# Patient Record
Sex: Female | Born: 1950 | Smoking: Never smoker
Health system: Southern US, Community
[De-identification: ages and names within clinical notes are randomized; demographics above are authoritative.]

## PROBLEM LIST (undated history)

## (undated) DIAGNOSIS — K759 Inflammatory liver disease, unspecified: Secondary | ICD-10-CM

## (undated) DIAGNOSIS — I2510 Atherosclerotic heart disease of native coronary artery without angina pectoris: Secondary | ICD-10-CM

## (undated) DIAGNOSIS — R011 Cardiac murmur, unspecified: Secondary | ICD-10-CM

## (undated) DIAGNOSIS — M797 Fibromyalgia: Secondary | ICD-10-CM

## (undated) DIAGNOSIS — E7800 Pure hypercholesterolemia, unspecified: Secondary | ICD-10-CM

## (undated) DIAGNOSIS — M799 Soft tissue disorder, unspecified: Secondary | ICD-10-CM

## (undated) DIAGNOSIS — E559 Vitamin D deficiency, unspecified: Secondary | ICD-10-CM

## (undated) DIAGNOSIS — G4700 Insomnia, unspecified: Secondary | ICD-10-CM

## (undated) DIAGNOSIS — D649 Anemia, unspecified: Secondary | ICD-10-CM

## (undated) DIAGNOSIS — K449 Diaphragmatic hernia without obstruction or gangrene: Secondary | ICD-10-CM

## (undated) DIAGNOSIS — I10 Essential (primary) hypertension: Secondary | ICD-10-CM

## (undated) DIAGNOSIS — F419 Anxiety disorder, unspecified: Secondary | ICD-10-CM

## (undated) DIAGNOSIS — K529 Noninfective gastroenteritis and colitis, unspecified: Secondary | ICD-10-CM

## (undated) DIAGNOSIS — G4730 Sleep apnea, unspecified: Secondary | ICD-10-CM

## (undated) DIAGNOSIS — R0602 Shortness of breath: Secondary | ICD-10-CM

## (undated) DIAGNOSIS — F329 Major depressive disorder, single episode, unspecified: Secondary | ICD-10-CM

## (undated) DIAGNOSIS — K219 Gastro-esophageal reflux disease without esophagitis: Secondary | ICD-10-CM

## (undated) DIAGNOSIS — F32A Depression, unspecified: Secondary | ICD-10-CM

## (undated) DIAGNOSIS — E669 Obesity, unspecified: Secondary | ICD-10-CM

## (undated) DIAGNOSIS — R51 Headache: Secondary | ICD-10-CM

## (undated) DIAGNOSIS — M1990 Unspecified osteoarthritis, unspecified site: Secondary | ICD-10-CM

## (undated) DIAGNOSIS — K2950 Unspecified chronic gastritis without bleeding: Secondary | ICD-10-CM

## (undated) DIAGNOSIS — T4145XA Adverse effect of unspecified anesthetic, initial encounter: Secondary | ICD-10-CM

## (undated) DIAGNOSIS — T8859XA Other complications of anesthesia, initial encounter: Secondary | ICD-10-CM

## (undated) DIAGNOSIS — A159 Respiratory tuberculosis unspecified: Secondary | ICD-10-CM

## (undated) DIAGNOSIS — M818 Other osteoporosis without current pathological fracture: Secondary | ICD-10-CM

## (undated) DIAGNOSIS — E119 Type 2 diabetes mellitus without complications: Secondary | ICD-10-CM

## (undated) DIAGNOSIS — J45909 Unspecified asthma, uncomplicated: Secondary | ICD-10-CM

## (undated) HISTORY — DX: Unspecified asthma, uncomplicated: J45.909

## (undated) HISTORY — DX: Other osteoporosis without current pathological fracture: M81.8

## (undated) HISTORY — DX: Type 2 diabetes mellitus without complications: E11.9

## (undated) HISTORY — DX: Vitamin D deficiency, unspecified: E55.9

## (undated) HISTORY — DX: Essential (primary) hypertension: I10

## (undated) HISTORY — DX: Atherosclerotic heart disease of native coronary artery without angina pectoris: I25.10

## (undated) HISTORY — DX: Soft tissue disorder, unspecified: M79.9

## (undated) HISTORY — DX: Diaphragmatic hernia without obstruction or gangrene: K44.9

## (undated) HISTORY — DX: Noninfective gastroenteritis and colitis, unspecified: K52.9

## (undated) HISTORY — DX: Pure hypercholesterolemia, unspecified: E78.00

## (undated) HISTORY — PX: BREAST BIOPSY: SHX20

## (undated) HISTORY — PX: CHOLECYSTECTOMY: SHX55

## (undated) HISTORY — DX: Unspecified chronic gastritis without bleeding: K29.50

## (undated) HISTORY — PX: REDUCTION MAMMAPLASTY: SUR839

## (undated) HISTORY — PX: APPENDECTOMY: SHX54

## (undated) HISTORY — DX: Insomnia, unspecified: G47.00

## (undated) HISTORY — PX: COSMETIC SURGERY: SHX468

## (undated) HISTORY — PX: DILATION AND CURETTAGE OF UTERUS: SHX78

## (undated) HISTORY — PX: COLONOSCOPY: SHX174

## (undated) HISTORY — DX: Obesity, unspecified: E66.9

---

## 2001-08-05 ENCOUNTER — Encounter: Payer: Self-pay | Admitting: Internal Medicine

## 2001-08-05 ENCOUNTER — Encounter: Admission: RE | Admit: 2001-08-05 | Discharge: 2001-08-05 | Payer: Self-pay | Admitting: Internal Medicine

## 2001-09-28 ENCOUNTER — Ambulatory Visit (HOSPITAL_COMMUNITY): Admission: RE | Admit: 2001-09-28 | Discharge: 2001-09-28 | Payer: Self-pay | Admitting: Internal Medicine

## 2001-09-30 ENCOUNTER — Encounter (HOSPITAL_BASED_OUTPATIENT_CLINIC_OR_DEPARTMENT_OTHER): Payer: Self-pay | Admitting: General Surgery

## 2001-10-02 ENCOUNTER — Encounter (INDEPENDENT_AMBULATORY_CARE_PROVIDER_SITE_OTHER): Payer: Self-pay | Admitting: *Deleted

## 2001-10-02 ENCOUNTER — Ambulatory Visit (HOSPITAL_COMMUNITY): Admission: RE | Admit: 2001-10-02 | Discharge: 2001-10-03 | Payer: Self-pay | Admitting: General Surgery

## 2001-10-02 ENCOUNTER — Encounter (HOSPITAL_BASED_OUTPATIENT_CLINIC_OR_DEPARTMENT_OTHER): Payer: Self-pay | Admitting: General Surgery

## 2003-01-18 ENCOUNTER — Encounter: Admission: RE | Admit: 2003-01-18 | Discharge: 2003-04-18 | Payer: Self-pay | Admitting: Internal Medicine

## 2003-03-19 ENCOUNTER — Emergency Department (HOSPITAL_COMMUNITY): Admission: EM | Admit: 2003-03-19 | Discharge: 2003-03-19 | Payer: Self-pay | Admitting: Emergency Medicine

## 2003-04-19 ENCOUNTER — Encounter: Admission: RE | Admit: 2003-04-19 | Discharge: 2003-07-18 | Payer: Self-pay | Admitting: Internal Medicine

## 2003-09-30 ENCOUNTER — Inpatient Hospital Stay (HOSPITAL_COMMUNITY): Admission: EM | Admit: 2003-09-30 | Discharge: 2003-10-01 | Payer: Self-pay | Admitting: Emergency Medicine

## 2004-05-19 ENCOUNTER — Emergency Department (HOSPITAL_COMMUNITY): Admission: EM | Admit: 2004-05-19 | Discharge: 2004-05-19 | Payer: Self-pay | Admitting: Family Medicine

## 2007-03-25 ENCOUNTER — Emergency Department (HOSPITAL_COMMUNITY): Admission: EM | Admit: 2007-03-25 | Discharge: 2007-03-25 | Payer: Self-pay | Admitting: Emergency Medicine

## 2007-04-14 ENCOUNTER — Emergency Department (HOSPITAL_COMMUNITY): Admission: EM | Admit: 2007-04-14 | Discharge: 2007-04-14 | Payer: Self-pay | Admitting: *Deleted

## 2007-04-20 ENCOUNTER — Emergency Department (HOSPITAL_COMMUNITY): Admission: EM | Admit: 2007-04-20 | Discharge: 2007-04-20 | Payer: Self-pay | Admitting: Emergency Medicine

## 2007-07-21 ENCOUNTER — Encounter: Admission: RE | Admit: 2007-07-21 | Discharge: 2007-07-21 | Payer: Self-pay | Admitting: Internal Medicine

## 2007-07-24 ENCOUNTER — Encounter: Admission: RE | Admit: 2007-07-24 | Discharge: 2007-07-24 | Payer: Self-pay | Admitting: Internal Medicine

## 2007-07-27 ENCOUNTER — Encounter: Admission: RE | Admit: 2007-07-27 | Discharge: 2007-07-27 | Payer: Self-pay | Admitting: Internal Medicine

## 2007-07-27 ENCOUNTER — Encounter (INDEPENDENT_AMBULATORY_CARE_PROVIDER_SITE_OTHER): Payer: Self-pay | Admitting: Diagnostic Radiology

## 2008-01-26 ENCOUNTER — Encounter: Admission: RE | Admit: 2008-01-26 | Discharge: 2008-01-26 | Payer: Self-pay | Admitting: Internal Medicine

## 2008-11-26 ENCOUNTER — Encounter: Admission: RE | Admit: 2008-11-26 | Discharge: 2008-11-26 | Payer: Self-pay | Admitting: Neurology

## 2008-12-20 ENCOUNTER — Encounter: Admission: RE | Admit: 2008-12-20 | Discharge: 2009-01-23 | Payer: Self-pay | Admitting: Neurology

## 2008-12-20 ENCOUNTER — Ambulatory Visit: Payer: Self-pay | Admitting: Psychology

## 2009-02-28 ENCOUNTER — Encounter: Admission: RE | Admit: 2009-02-28 | Discharge: 2009-02-28 | Payer: Self-pay | Admitting: Internal Medicine

## 2010-04-04 ENCOUNTER — Encounter: Admission: RE | Admit: 2010-04-04 | Discharge: 2010-04-04 | Payer: Self-pay | Admitting: Internal Medicine

## 2010-04-30 ENCOUNTER — Ambulatory Visit (HOSPITAL_COMMUNITY): Admission: EM | Admit: 2010-04-30 | Discharge: 2010-05-01 | Payer: Self-pay | Admitting: Emergency Medicine

## 2010-04-30 ENCOUNTER — Encounter (INDEPENDENT_AMBULATORY_CARE_PROVIDER_SITE_OTHER): Payer: Self-pay | Admitting: General Surgery

## 2010-09-07 DIAGNOSIS — R519 Headache, unspecified: Secondary | ICD-10-CM | POA: Insufficient documentation

## 2010-09-07 DIAGNOSIS — M799 Soft tissue disorder, unspecified: Secondary | ICD-10-CM

## 2010-09-07 HISTORY — DX: Soft tissue disorder, unspecified: M79.9

## 2010-09-24 DIAGNOSIS — E1149 Type 2 diabetes mellitus with other diabetic neurological complication: Secondary | ICD-10-CM | POA: Insufficient documentation

## 2010-09-24 DIAGNOSIS — E118 Type 2 diabetes mellitus with unspecified complications: Secondary | ICD-10-CM | POA: Insufficient documentation

## 2010-09-24 DIAGNOSIS — E1165 Type 2 diabetes mellitus with hyperglycemia: Secondary | ICD-10-CM | POA: Insufficient documentation

## 2010-09-24 DIAGNOSIS — I152 Hypertension secondary to endocrine disorders: Secondary | ICD-10-CM | POA: Insufficient documentation

## 2010-12-01 ENCOUNTER — Encounter (HOSPITAL_BASED_OUTPATIENT_CLINIC_OR_DEPARTMENT_OTHER): Payer: Self-pay | Admitting: General Surgery

## 2010-12-20 DIAGNOSIS — M3320 Polymyositis, organ involvement unspecified: Secondary | ICD-10-CM | POA: Insufficient documentation

## 2010-12-20 DIAGNOSIS — J45901 Unspecified asthma with (acute) exacerbation: Secondary | ICD-10-CM

## 2010-12-20 HISTORY — DX: Unspecified asthma with (acute) exacerbation: J45.901

## 2011-01-27 LAB — POCT CARDIAC MARKERS
CKMB, poc: 1 ng/mL (ref 1.0–8.0)
Myoglobin, poc: 92.5 ng/mL (ref 12–200)
Troponin i, poc: <0.05 ng/mL (ref 0.00–0.09)

## 2011-01-27 LAB — HEPATIC FUNCTION PANEL
Bilirubin, Direct: 0.1 mg/dL (ref 0.0–0.3)
Indirect Bilirubin: 0.2 mg/dL — ABNORMAL LOW (ref 0.3–0.9)
Total Protein: 7.6 g/dL (ref 6.0–8.3)

## 2011-01-27 LAB — URINALYSIS, ROUTINE W REFLEX MICROSCOPIC
Hgb urine dipstick: NEGATIVE
Nitrite: NEGATIVE
Protein, ur: NEGATIVE mg/dL
Specific Gravity, Urine: 1.009 (ref 1.005–1.030)
pH: 7.5 (ref 5.0–8.0)

## 2011-01-27 LAB — DIFFERENTIAL
Basophils Absolute: 0 10*3/uL (ref 0.0–0.1)
Eosinophils Relative: 0 % (ref 0–5)
Lymphocytes Relative: 14 % (ref 12–46)
Lymphs Abs: 1.3 10*3/uL (ref 0.7–4.0)
Monocytes Absolute: 0.2 10*3/uL (ref 0.1–1.0)
Neutro Abs: 7.8 10*3/uL — ABNORMAL HIGH (ref 1.7–7.7)
Neutrophils Relative %: 84 % — ABNORMAL HIGH (ref 43–77)

## 2011-01-27 LAB — CBC
Hemoglobin: 12.9 g/dL (ref 12.0–15.0)
RBC: 4.98 MIL/uL (ref 3.87–5.11)
RDW: 14.1 % (ref 11.5–15.5)
WBC: 9.4 10*3/uL (ref 4.0–10.5)

## 2011-01-27 LAB — GLUCOSE, CAPILLARY: Glucose-Capillary: 111 mg/dL — ABNORMAL HIGH (ref 70–99)

## 2011-01-27 LAB — BASIC METABOLIC PANEL
GFR calc Af Amer: >60 mL/min (ref >60)
GFR calc non Af Amer: >60 mL/min (ref >60)
Glucose, Bld: 124 mg/dL — ABNORMAL HIGH (ref 70–99)
Potassium: 3.9 mEq/L (ref 3.5–5.1)

## 2011-01-27 LAB — LIPASE, BLOOD: Lipase: 47 U/L (ref 11–59)

## 2011-03-29 NOTE — Op Note (Signed)
[place]  Patient:    Pam [name], Pam [name] Visit Number: [id number] MRN: [id number]          Service Type: DSU Location: [phone] Attending Physician:  Sonda Primes Dictated by:   Mardene Celeste. Lurene Shadow, M.D. Proc. Date: 10/02/01 Admit Date:  10/02/2001                             Operative Report  PREOPERATIVE DIAGNOSIS:  Chronic calculous cholecystitis.  POSTOPERATIVE DIAGNOSIS:  Chronic calculous cholecystitis.  PROCEDURE:  Laparoscopic cholecystectomy and intraoperative cholangiogram.  SURGEON:  Luisa Hart L. Lurene Shadow, M.D.  ASSISTANT:  Marnee Spring. Wiliam Ke, M.D.  ANESTHESIA:  General.  CLINICAL NOTE:  The patient is a 60 year old woman with symptoms of right upper quadrant pain, nausea, and bloating, on evaluation noted to have cholelithiasis.  Liver function studies, amylase, and lipase are within normal limits except for a mildly elevated SGPT at 52.  She has been brought now to the operating room for laparoscopic cholecystectomy and cholangiogram.  DESCRIPTION OF PROCEDURE:  Following the induction of satisfactory anesthesia with the patient positioned supinely, the abdomen is routinely prepped and draped to be included in the sterile operative field.  Open laparoscopy is carried out at the umbilicus, and the abdomen is insufflated with carbon dioxide to 14 Tor.  The camera is inserted and visual exploration carried out. The liver edge is sharp with the surfaces smooth.  The gallbladder with some modest scarring.  Anterior gastric wall and duodenal sweep within normal limits.  No small or large intestine viewed appeared to be abnormal.  No pelvic organs were visualized.  Under direct vision, epigastric and lateral ports were placed.  The gallbladder is grasped and retracted cephalad and adhesions of the gallbladder are taken down, carrying the dissection down to the ampulla.  At this point the cystic artery and cystic duct were  identified, cystic duct being traced up to the cystic duct-gallbladder junction and the cystic artery traced to its entry into the gallbladder wall.  The cystic artery doubly clipped and transected, cystic duct clipped proximally and opened.  I inserted a Reddick catheter into the abdomen and carried out a cholangiogram by injecting one-half strength Hypaque into the biliary system. The resulting cholangiogram showed no filling defect within the biliary system, free of contrast into the duodenum.  The cholangiocatheter is removed and the cystic duct doubly clipped and transected, the gallbladder dissected away from the liver bed using electrocautery.  The right upper quadrant was thoroughly irrigated with saline and aspirated.  The camera moved to the epigastric port and the gallbladder retrieved through the umbilical port without difficulty.  Sponge, instrument, and sharp counts verified. Pneumoperitoneum deflated, and the wounds closed in layers as follows after trocars were removed under direct vision:  The umbilical wound in two layers with 0 Dexon and 4-0 Dexon, epigastric and lateral flank wounds closed with 4-0 Dexon sutures.  All the wounds then reinforced with Steri-Strips and sterile dressings applied.  Anesthetic reversed, patient removed from the operating room to the recovery room in stable condition.  She tolerated the procedure well. Dictated by:   Mardene Celeste. Lurene Shadow, M.D. Attending Physician:  Sonda Primes DD:  10/02/01 TD:  10/02/01 Job: [id number]

## 2011-03-29 NOTE — Discharge Summary (Signed)
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  She  presented to [facility] Emergency Room on September 30, 2003, with a two day  history of recurrent episodes of chest pain.  These are brief in duration,  associated with a squeezing sensation along with some tightness in her left  arm area.  Noteworthy is that the patient has had two negative stress tests,  most recently in June of 2004 when a Cardiolite stress done at [facility] was reportedly normal.   PHYSICAL EXAM AT THE TIME OF ADMISSION:  HEENT EXAM:  Within normal limits.  CHEST:  Clear.  BACK:  Revealed no CVA or point tenderness.  CARDIOVASCULAR EXAM:  Normal S1 and S2 without rubs, murmurs or gallops.  ABDOMEN:  Benign.  NEUROLOGIC TESTING:  Examination of extremities was normal.   Serial cardiac enzymes were obtained, these were negative.  The  electrocardiogram showed only nonspecific ST and T-wave changes.  A chest x-  ray was normal.  In light of the relatively acute nature of one of the  episodes on the day prior to admission, I sent her for a spiral CT scan of  the chest.  This showed no pulmonary pathology and was negative for  pulmonary embolus.   Professor Company secretary and I discussed her status in more detail the next day.  Two things stand out about this discussion.  One is that she seems to be  experiencing difficulty sleeping and describes other symptoms which suggest  to me that she may have some vegetative symptoms of depression.  Secondly,  she describes a history which  sounds really typical for sleep apnea.  She  has very loud snoring and other people have reported that she will sometimes  hold her breath at night when she sleeps.  On October 01, 2003, the patient  seemed to be doing very well, there was no residual chest discomfort.  Given  her description of the chest discomfort and the recent negative Cardiolite  study, I assessed her risk of having a cardiac problem as being low . We  discussed possibly having [facility] see her again, but it was  agreed that it was unlikely that any action would be taken on Saturday in  regard to any additional testing.  Therefore, on October 01, 2003 the  patient was discharged.   DISCHARGE DIAGNOSES:  1. Chest pain of uncertain etiology.  2. Diabetes.  3. Family history of heart disease.  4. Fatigue, possible depression.  5. History compatible with sleep apnea.   At the time of discharge I advised her to continue Avandia and Amaryl as  previously.  I also advised her to take one aspirin daily.  I suggested a  trial of Zoloft beginning with 25 mg daily.  Finally, I suggested that it  would be sensible  to discuss with Dr. Allyne Gee the possibility of scheduling a sleep study to  evaluate the possibility of sleep apnea.  This could certainly be  contributing to her fatigue symptoms.  I also encouraged her to follow up on  a regular basis with Dr. Allyne Gee and to contact her office on Monday to make  an appointment.                                                Sherin Quarry, MD    SY/MEDQ  D:  10/01/2003  T:  10/02/2003  Job:  [id number]   cc:   Candyce Churn. Allyne Gee, M.D.  [address]  [address]  [place]  [place] [address]  Fax: [phone]   [facility]

## 2011-03-29 NOTE — H&P (Signed)
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  Yesterday, the patient was seen at  Dr. Candyce Churn. Sanders's office and on completing her examination, she was  standing in line to pay her bill when she suddenly experienced a severe  substernal chest discomfort which was apparently quite striking but very  brief in duration.  She describes the pain as squeezing.  Dr. Allyne Gee  reevaluated her, did a cardiogram and told the patient that it was abnormal.  Initially, the plan was going to be to admit her to the hospital but after  she remained in the office for a while and rested, the EKG was repeated and  was said to be normal at that time.  The patient was released to home.  During the course of the evening, she felt somewhat nauseated but was able  to rest all right.  She said her chest had a full feeling.  Today, after  getting up, she developed intense chest tightness with pain radiating down  her left arm.  She felt dizzy, nauseated and slightly short of breath.  She  drove herself to the emergency room.  On arrival, she was given one  sublingual nitroglycerin, which she said seemed to partially relieve the  discomfort.  Now she is complaining of an aching feeling in which she  describes as being all over her chest.  She states that she is not dyspneic,  she is not having any diaphoresis or nausea.  All cardiac enzymes are  negative to date.  A STAT-8 is normal.   Of note is that in 2001, the patient was in a play at [facility]  when she developed chest pain and tightness and was evaluated in the  emergency room in [place], [place].  EKG and enzymes were normal at  that time.  A stress test was done which was negative.  In June of 2004, at  Dr. Zella Ball request, the patient had a Cardiolite stress done at [facility] which was reportedly normal as well.  The patient reports that  recently she has been feeling quite fatigued and has not been sleeping well  at night.   PAST MEDICAL HISTORY:   MEDICATIONS:  She takes:  1. Amaryl 2 mg daily.  2. Avandia 4 mg daily.   ILLNESSES:  The patient has a 16-month history of diabetes  which is said to  be very well regulated with glucoses generally in the range of 90 to 140.   FAMILY HISTORY:  The patient's grandmother has a history of diabetes.  Her  father died as a result of heart disease in his early 10s.  Her mother has a  history of heart problems.   SOCIAL HISTORY:  She does not smoke or use alcohol.  All of her family is  currently in [place].  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  She is  unmarried and apparently lives by herself.  She reports a feeling of  increased stress recently associated with insomnia.   REVIEW OF SYSTEMS:  HEAD:  She denies headache or dizziness.  EYES:  She  denies visual blurring or diplopia.  EARS, NOSE AND THROAT:  Denies earache,  sinus pain or sore throat.  CHEST:  Denies coughing, wheezing or chest  congestion.  CARDIOVASCULAR:  Denies orthopnea, PND or ankle edema.  GI:  Denies nausea, vomiting, abdominal pain, change in bowel habits, melena or  hematochezia.  GU:  Denies dysuria or urinary frequency, hesitancy or  nocturia.  RHEUMATOLOGIC:  Denies back pain or joint pain.  HEMATOLOGIC:  Denies easy bleeding or bruising.  NEUROLOGIC:  Denies history of seizure or  stroke.   PHYSICAL EXAMINATION:  VITAL SIGNS:  On physical exam, the blood pressure is  reported to be 130/80, pulse is 72,  respirations are 16 and regular.  HEENT:  Exam is within normal limits.  CHEST:  The chest is clear.  CARDIOVASCULAR:  Exam reveals normal S1 and S2 without murmurs, rubs, or  gallops.  ABDOMEN:  The abdomen is benign, minimal bowel sounds, without masses,  tenderness or organomegaly.  NEUROLOGIC/EXTREMITIES:  Neurologic testing and examination of extremities  are normal.   LABORATORY STUDIES:  Troponin levels are negative x3.  Sodium is 141,  potassium 4.0, chloride 106, bicarbonate 31.  Hemoglobin is 14.   The chest x-ray was within normal limits.   IMPRESSION:  1. History of recurrent chest pain of uncertain etiology.  2. Diabetes.  3. Family history of heart disease.  4. Fatigue possibly associated with depression and insomnia.   PLAN:  I think it might be prudent, in light of the sudden onset of the pain  the patient had yesterday, to obtain a spiral CT scan, just to make sure  there is no sign of a pulmonary embolus.  We will admit her to rule out MI  and I will order appropriate pain medication and, of course, we will monitor  her blood sugars.  It is rather hard to say what the best thing is to do  next.  She certainly has had thorough evaluation from the standpoint of  stress tests.  It might at least be reasonable to have the cardiologists see  her and see what their thoughts might be.                                                Sherin Quarry, MD    SY/MEDQ  D:  09/30/2003  T:  10/01/2003  Job:  [id number]   cc:   Candyce Churn. Allyne Gee, M.D.  [address]  [address]  [place]  [place] [address]  Fax: [phone]   [facility]

## 2012-02-03 ENCOUNTER — Other Ambulatory Visit (HOSPITAL_BASED_OUTPATIENT_CLINIC_OR_DEPARTMENT_OTHER): Payer: Self-pay | Admitting: Rheumatology

## 2012-02-03 DIAGNOSIS — R52 Pain, unspecified: Secondary | ICD-10-CM

## 2012-02-04 ENCOUNTER — Other Ambulatory Visit (HOSPITAL_BASED_OUTPATIENT_CLINIC_OR_DEPARTMENT_OTHER): Payer: Self-pay

## 2012-02-08 ENCOUNTER — Ambulatory Visit (HOSPITAL_BASED_OUTPATIENT_CLINIC_OR_DEPARTMENT_OTHER)
Admission: RE | Admit: 2012-02-08 | Discharge: 2012-02-08 | Disposition: A | Discharge status: Home / Self Care | Payer: BC Managed Care – PPO | Source: Ambulatory Visit | Attending: Rheumatology | Admitting: Rheumatology

## 2012-02-08 DIAGNOSIS — M25473 Effusion, unspecified ankle: Secondary | ICD-10-CM

## 2012-02-08 DIAGNOSIS — M25579 Pain in unspecified ankle and joints of unspecified foot: Secondary | ICD-10-CM | POA: Insufficient documentation

## 2012-02-08 DIAGNOSIS — M659 Unspecified synovitis and tenosynovitis, unspecified site: Secondary | ICD-10-CM | POA: Insufficient documentation

## 2012-02-08 DIAGNOSIS — M25476 Effusion, unspecified foot: Secondary | ICD-10-CM | POA: Insufficient documentation

## 2012-02-08 DIAGNOSIS — R52 Pain, unspecified: Secondary | ICD-10-CM

## 2012-02-24 ENCOUNTER — Emergency Department (HOSPITAL_COMMUNITY): Payer: BC Managed Care – PPO

## 2012-02-24 ENCOUNTER — Encounter (HOSPITAL_COMMUNITY): Payer: Self-pay | Admitting: *Deleted

## 2012-02-24 ENCOUNTER — Observation Stay (HOSPITAL_COMMUNITY)
Admission: EM | Admit: 2012-02-24 | Discharge: 2012-02-25 | Disposition: A | Discharge status: Home / Self Care | Payer: BC Managed Care – PPO | Source: Ambulatory Visit | Attending: Internal Medicine | Admitting: Internal Medicine

## 2012-02-24 DIAGNOSIS — J45909 Unspecified asthma, uncomplicated: Secondary | ICD-10-CM | POA: Insufficient documentation

## 2012-02-24 DIAGNOSIS — J4541 Moderate persistent asthma with (acute) exacerbation: Secondary | ICD-10-CM

## 2012-02-24 DIAGNOSIS — R209 Unspecified disturbances of skin sensation: Secondary | ICD-10-CM | POA: Insufficient documentation

## 2012-02-24 DIAGNOSIS — R55 Syncope and collapse: Secondary | ICD-10-CM | POA: Insufficient documentation

## 2012-02-24 DIAGNOSIS — M797 Fibromyalgia: Secondary | ICD-10-CM | POA: Insufficient documentation

## 2012-02-24 DIAGNOSIS — R61 Generalized hyperhidrosis: Secondary | ICD-10-CM | POA: Insufficient documentation

## 2012-02-24 DIAGNOSIS — R0789 Other chest pain: Principal | ICD-10-CM | POA: Diagnosis present

## 2012-02-24 DIAGNOSIS — E119 Type 2 diabetes mellitus without complications: Secondary | ICD-10-CM | POA: Diagnosis present

## 2012-02-24 DIAGNOSIS — R0602 Shortness of breath: Secondary | ICD-10-CM | POA: Insufficient documentation

## 2012-02-24 HISTORY — DX: Unspecified osteoarthritis, unspecified site: M19.90

## 2012-02-24 HISTORY — DX: Fibromyalgia: M79.7

## 2012-02-24 HISTORY — DX: Adverse effect of unspecified anesthetic, initial encounter: T41.45XA

## 2012-02-24 HISTORY — DX: Depression, unspecified: F32.A

## 2012-02-24 HISTORY — DX: Major depressive disorder, single episode, unspecified: F32.9

## 2012-02-24 HISTORY — DX: Moderate persistent asthma with (acute) exacerbation: J45.41

## 2012-02-24 HISTORY — DX: Other complications of anesthesia, initial encounter: T88.59XA

## 2012-02-24 HISTORY — DX: Shortness of breath: R06.02

## 2012-02-24 HISTORY — DX: Anxiety disorder, unspecified: F41.9

## 2012-02-24 HISTORY — DX: Cardiac murmur, unspecified: R01.1

## 2012-02-24 LAB — BASIC METABOLIC PANEL
CO2: 27 mEq/L (ref 19–32)
Calcium: 9.6 mg/dL (ref 8.4–10.5)
Chloride: 104 mEq/L (ref 96–112)
Glucose, Bld: 88 mg/dL (ref 70–99)
Sodium: 140 mEq/L (ref 135–145)

## 2012-02-24 LAB — DIFFERENTIAL
Eosinophils Relative: 2 % (ref 0–5)
Lymphocytes Relative: 37 % (ref 12–46)
Lymphs Abs: 2.1 10*3/uL (ref 0.7–4.0)
Monocytes Absolute: 0.4 10*3/uL (ref 0.1–1.0)

## 2012-02-24 LAB — CBC
HCT: 36.8 % (ref 36.0–46.0)
MCH: 25.7 pg — ABNORMAL LOW (ref 26.0–34.0)
MCHC: 34.5 g/dL (ref 30.0–36.0)
MCV: 75.4 fL — ABNORMAL LOW (ref 78.0–100.0)
Platelets: 315 10*3/uL (ref 150–400)
Platelets: 288 10*3/uL (ref 150–400)
RBC: 4.88 MIL/uL (ref 3.87–5.11)
RBC: 4.71 MIL/uL (ref 3.87–5.11)
RDW: 13.8 % (ref 11.5–15.5)
WBC: 5.6 10*3/uL (ref 4.0–10.5)

## 2012-02-24 LAB — CREATININE, SERUM: Creatinine, Ser: 0.64 mg/dL (ref 0.50–1.10)

## 2012-02-24 LAB — CARDIAC PANEL(CRET KIN+CKTOT+MB+TROPI)
CK, MB: 1.6 ng/mL (ref 0.3–4.0)
Total CK: 57 U/L (ref 7–177)
Troponin I: <0.3 ng/mL (ref <0.30)

## 2012-02-24 LAB — D-DIMER, QUANTITATIVE: D-Dimer, Quant: 0.34 ug/mL-FEU (ref 0.00–0.48)

## 2012-02-24 MED ORDER — LINAGLIPTIN 5 MG PO TABS
5.0000 mg | ORAL_TABLET | Freq: Two times a day (BID) | ORAL | Status: DC
  Administered 2012-02-25: 5 mg via ORAL
  Filled 2012-02-24 (×3): qty 1

## 2012-02-24 MED ORDER — HEPARIN SODIUM (PORCINE) 5000 UNIT/ML IJ SOLN
5000.0000 [IU] | Freq: Three times a day (TID) | INTRAMUSCULAR | Status: DC
  Administered 2012-02-24 – 2012-02-25 (×2): 5000 [IU] via SUBCUTANEOUS
  Filled 2012-02-24 (×5): qty 1

## 2012-02-24 MED ORDER — SAXAGLIPTIN-METFORMIN ER 2.5-1000 MG PO TB24: 1.0000 | ORAL_TABLET | Freq: Two times a day (BID) | ORAL | Status: DC

## 2012-02-24 MED ORDER — ONDANSETRON HCL 4 MG PO TABS: 4.0000 mg | ORAL_TABLET | Freq: Four times a day (QID) | ORAL | Status: DC | PRN

## 2012-02-24 MED ORDER — METFORMIN HCL 500 MG PO TABS
1000.0000 mg | ORAL_TABLET | Freq: Two times a day (BID) | ORAL | Status: DC
  Administered 2012-02-25: 1000 mg via ORAL
  Filled 2012-02-24 (×3): qty 2

## 2012-02-24 MED ORDER — ACETAMINOPHEN 650 MG RE SUPP: 650.0000 mg | Freq: Four times a day (QID) | RECTAL | Status: DC | PRN

## 2012-02-24 MED ORDER — POLYETHYLENE GLYCOL 3350 17 G PO PACK
17.0000 g | PACK | Freq: Every day | ORAL | Status: DC | PRN
  Filled 2012-02-24: qty 1

## 2012-02-24 MED ORDER — SODIUM CHLORIDE 0.9 % IJ SOLN: 3.0000 mL | INTRAMUSCULAR | Status: DC | PRN

## 2012-02-24 MED ORDER — ACETAMINOPHEN 325 MG PO TABS: 650.0000 mg | ORAL_TABLET | Freq: Four times a day (QID) | ORAL | Status: DC | PRN

## 2012-02-24 MED ORDER — INSULIN ASPART 100 UNIT/ML ~~LOC~~ SOLN: 0.0000 [IU] | Freq: Three times a day (TID) | SUBCUTANEOUS | Status: DC

## 2012-02-24 MED ORDER — SODIUM CHLORIDE 0.9 % IJ SOLN
3.0000 mL | Freq: Two times a day (BID) | INTRAMUSCULAR | Status: DC
  Administered 2012-02-24 – 2012-02-25 (×2): 3 mL via INTRAVENOUS

## 2012-02-24 MED ORDER — CYCLOBENZAPRINE HCL 10 MG PO TABS: 10.0000 mg | ORAL_TABLET | Freq: Every evening | ORAL | Status: DC | PRN

## 2012-02-24 MED ORDER — HYDROCODONE-ACETAMINOPHEN 5-325 MG PO TABS: 1.0000 | ORAL_TABLET | ORAL | Status: DC | PRN

## 2012-02-24 MED ORDER — LORATADINE 10 MG PO TABS
10.0000 mg | ORAL_TABLET | Freq: Every day | ORAL | Status: DC
  Administered 2012-02-25: 10 mg via ORAL
  Filled 2012-02-24: qty 1

## 2012-02-24 MED ORDER — SODIUM CHLORIDE 0.9 % IJ SOLN: 3.0000 mL | Freq: Two times a day (BID) | INTRAMUSCULAR | Status: DC

## 2012-02-24 MED ORDER — ALBUTEROL SULFATE HFA 108 (90 BASE) MCG/ACT IN AERS
2.0000 | INHALATION_SPRAY | Freq: Four times a day (QID) | RESPIRATORY_TRACT | Status: DC | PRN
  Filled 2012-02-24: qty 6.7

## 2012-02-24 MED ORDER — ONDANSETRON HCL 4 MG/2ML IJ SOLN: 4.0000 mg | Freq: Four times a day (QID) | INTRAMUSCULAR | Status: DC | PRN

## 2012-02-24 MED ORDER — DULOXETINE HCL 30 MG PO CPEP
30.0000 mg | ORAL_CAPSULE | Freq: Every day | ORAL | Status: DC
  Administered 2012-02-25: 30 mg via ORAL
  Filled 2012-02-24: qty 1

## 2012-02-24 MED ORDER — INSULIN ASPART 100 UNIT/ML ~~LOC~~ SOLN: 3.0000 [IU] | Freq: Three times a day (TID) | SUBCUTANEOUS | Status: DC

## 2012-02-24 MED ORDER — SODIUM CHLORIDE 0.9 % IV SOLN: 250.0000 mL | INTRAVENOUS | Status: DC | PRN

## 2012-02-24 NOTE — ED Notes (Signed)
PT PAIN IS REPRODUCABLE WITH PALPATION AND MOVEMENT. LOCALIZED TO LEFT SIDE OF CHEST. DENIES SOB. HAS NASAL CONGESTION

## 2012-02-24 NOTE — ED Notes (Signed)
2021-01 Ready 

## 2012-02-24 NOTE — ED Notes (Signed)
While at work sitting at desk c/o sudden onset SSCP, SOB. Per EMS - given NTG x2 with some relief, took ASA prior to EMS arrival

## 2012-02-24 NOTE — ED Notes (Signed)
Patient is being transferred to unit 2000.  No no complaints at this time

## 2012-02-24 NOTE — H&P (Signed)
Hospital Admission Note Date: 02/24/2012  PCP: No primary provider on file.  Chief Complaint: Atypical chest pain  History of Present Illness: This is a 61 year old female with past medical history of diabetes former smoker that comes in complaining of shortness of breath and chest pain this started today at 2 PM. She relates no new exercise or new strenuous exercise. She relates is chest pain here suddenly accompanied with hot flashes and shortness of breath and she can feel her heart beating faster. She also relates her pain is made worse by deep inspiration. relates the pain is not made worse by exertion is made worse by taking a deep breath, better with nitroglycerin. She relates no sweating during these episodes, no nausea no vomiting no diarrhea.  She denies fever, cough dyspnea on exertion or PND.  Allergies: Review of patient's allergies indicates no known allergies. Past Medical History  Diagnosis Date  . Diabetes mellitus   . Fibromyalgia   . Arthritis   . Asthma    Prior to Admission medications   Medication Sig Start Date End Date Taking? Authorizing Provider  albuterol (PROVENTIL HFA;VENTOLIN HFA) 108 (90 BASE) MCG/ACT inhaler Inhale 2 puffs into the lungs every 6 (six) hours as needed. For shortness of breath   Yes Historical Provider, MD  cetirizine (ZYRTEC) 10 MG tablet Take 10 mg by mouth daily.   Yes Historical Provider, MD  cyclobenzaprine (FLEXERIL) 10 MG tablet Take 10 mg by mouth at bedtime as needed. For muscle spasms   Yes Historical Provider, MD  DULoxetine (CYMBALTA) 30 MG capsule Take 30 mg by mouth daily.   Yes Historical Provider, MD  KRILL OIL PO Take 1 tablet by mouth daily.   Yes Historical Provider, MD  meloxicam (MOBIC) 15 MG tablet Take 15 mg by mouth every evening.   Yes Historical Provider, MD  Saxagliptin-Metformin (KOMBIGLYZE XR) 2.03-999 MG TB24 Take 1 tablet by mouth 2 (two) times daily.   Yes Historical Provider, MD  STUDY MEDICATION Inhale 1  puff into the lungs 2 (two) times daily. Study medication similar to advair   Yes Historical Provider, MD  Vitamin D, Ergocalciferol, (DRISDOL) 50000 UNITS CAPS Take 50,000 Units by mouth 2 (two) times a week. Sunday and thursday   Yes Historical Provider, MD   Past Surgical History  Procedure Date  . Appendectomy   . Cholecystectomy    Family History  Problem Relation Age of Onset  . Stroke Mother   . Heart attack Father   . Diabetes type II Sister    History   Social History  . Marital Status: Single    Spouse Name: N/A    Number of Children: N/A  . Years of Education: N/A   Occupational History  . Not on file.   Social History Main Topics  . Smoking status: Former Smoker -- 0.5 packs/day for 20 years  . Smokeless tobacco: Not on file  . Alcohol Use: 0.6 oz/week    1 Shots of liquor per week     "rarely"  . Drug Use: No  . Sexually Active: No   Other Topics Concern  . Not on file   Social History Narrative  . No narrative on file    REVIEW OF SYSTEMS:  Constitutional:  No weight loss, night sweats, Fevers, chills, fatigue.  HEENT:  No headaches, Difficulty swallowing,Tooth/dental problems,Sore throat,  No sneezing, itching, ear ache, nasal congestion, post nasal drip,  Cardio-vascular:  No chest pain, Orthopnea, PND, swelling in lower extremities, anasarca,  dizziness, palpitations  GI:  No heartburn, indigestion, abdominal pain, nausea, vomiting, diarrhea, change in bowel habits, loss of appetite   Skin:  no rash or lesions.  GU:  no dysuria, change in color of urine, no urgency or frequency. No flank pain.  Musculoskeletal:  No joint pain or swelling. No decreased range of motion. No back pain.  Psych:  No change in mood or affect. No depression or anxiety. No memory loss.   Physical Exam: Filed Vitals:   02/24/12 1504 02/24/12 1849 02/24/12 1908  BP: 135/66 147/61 139/77  Pulse: 73 76 79  Temp: 98 F (36.7 C)    TempSrc: Oral    Resp: 14 14 15     SpO2: 99% 99% 97%   No intake or output data in the 24 hours ending 02/24/12 1910 BP 139/77  Pulse 79  Temp(Src) 98 F (36.7 C) (Oral)  Resp 15  SpO2 97%  General Appearance:    Alert, cooperative, no distress, appears stated age  Head:    Normocephalic, without obvious abnormality, atraumatic  Eyes:    PERRL, conjunctiva/corneas clear, EOM's intact, fundi    benign, both eyes  Ears:    Normal TM's and external ear canals, both ears  Nose:   Nares normal, septum midline, mucosa normal, no drainage    or sinus tenderness  Throat:   Lips, mucosa, and tongue normal; teeth and gums normal  Neck:   Supple, symmetrical, trachea midline, no adenopathy;    thyroid:  no enlargement/tenderness/nodules; no carotid   bruit or JVD  Back:     Symmetric, no curvature, ROM normal, no CVA tenderness  Lungs:     Clear to auscultation bilaterally, respirations unlabored  Chest Wall:    No deformity, chest nontender to palpation on the right and left costal sternal area.    Heart:    Regular rate and rhythm, S1 and S2 normal, no murmur, rub   or gallop     Abdomen:     Soft, non-tender, bowel sounds active all four quadrants,    no masses, no organomegaly        Extremities:   Extremities normal, atraumatic, no cyanosis or edema  Pulses:   2+ and symmetric all extremities  Skin:   Skin color, texture, turgor normal, no rashes or lesions  Lymph nodes:   Cervical, supraclavicular, and axillary nodes normal  Neurologic:   CNII-XII intact, normal strength, sensation and reflexes    throughout   Lab results:  Basename 02/24/12 1459  NA 140  K 3.8  CL 104  CO2 27  GLUCOSE 88  BUN 12  CREATININE 0.58  CALCIUM 9.6  MG --  PHOS --   No results found for this basename: AST:2,ALT:2,ALKPHOS:2,BILITOT:2,PROT:2,ALBUMIN:2 in the last 72 hours No results found for this basename: LIPASE:2,AMYLASE:2 in the last 72 hours  Basename 02/24/12 1459  WBC 5.6  NEUTROABS 3.0  HGB 12.3  HCT 36.8  MCV  75.4*  PLT 315   No results found for this basename: CKTOTAL:3,CKMB:3,CKMBINDEX:3,TROPONINI:3 in the last 72 hours No components found with this basename: POCBNP:3 No results found for this basename: DDIMER:2 in the last 72 hours No results found for this basename: HGBA1C:2 in the last 72 hours No results found for this basename: CHOL:2,HDL:2,LDLCALC:2,TRIG:2,CHOLHDL:2,LDLDIRECT:2 in the last 72 hours No results found for this basename: TSH,T4TOTAL,FREET3,T3FREE,THYROIDAB in the last 72 hours No results found for this basename: VITAMINB12:2,FOLATE:2,FERRITIN:2,TIBC:2,IRON:2,RETICCTPCT:2 in the last 72 hours Imaging results:  Mr Ankle Right  Wo Contrast  02/08/2012  *RADIOLOGY REPORT*  Clinical Data: Pain.  Synovitis.  Medial right ankle pain and bruising.  Marland Kitchen  MRI OF THE RIGHT ANKLE WITHOUT CONTRAST  Technique:  Multiplanar, multisequence MR imaging was performed. No intravenous contrast was administered.  Comparison: None.  Findings: There is a longitudinal split tear of the posterior tibial tendon beginning at the level of the medial malleolus and extending nearly to the insertion on the navicular bone. Tenosynovitis is present in the posterior tibial and flexor digitorum longus tendon sheaths.  The flexor Hallucis longus tendon appears normal.  There is also a longitudinal split tear of the peroneus brevis tendon extending from retrofibular groove to the peroneal or tubercle.  Tenosynovitis of the peroneal tendons is also present. Distal tibiofibular joint appears normal.  The lateral ankle ligaments appear intact.  The deltoid ligament is intact.  Plantar fascia and Achilles tendon appear normal.  There is some mild edema in the sinus tarsi without complete effacement of sinus tarsi fat or remodeling.  This may be reactive or degenerative. Small ankle and subtalar effusion is present.  The anterior tendon group appears normal.  IMPRESSION:  1.  Longitudinal split of the posterior tibial tendon  extending from the medial malleolus to insertion on the navicular bone. Associated tenosynovitis of the posterior tibial tendon and flexor digitorum longus tendon. 2.  Longitudinal split tear of the peroneus brevis tendon extending from retrofibular groove to the peroneal tubercle. 3.  Ankle and subtalar effusion, probably reactive.  No definite synovitis is identified on this noncontrast MRI however effusion and acute synovitis can appear similar in the absence of contrast.  Original Report Authenticated By: Andreas Newport, M.D.   Dg Chest Port 1 View  02/24/2012  *RADIOLOGY REPORT*  Clinical Data: Chest pain, diabetes  PORTABLE CHEST - 1 VIEW  Comparison: 04/30/2010  Findings: Cardiomediastinal silhouette is stable.  No acute infiltrate or pleural effusion.  No pulmonary edema.  Bony thorax is stable.  IMPRESSION: No active disease.  No significant change.  Original Report Authenticated By: Natasha Mead, M.D.   Other results: EKG: normal EKG, normal sinus rhythm.   Patient Active Hospital Problem List: Atypical chest pain (02/24/2012)  her pain sounds atypical, her first set of cardiac enzymes is negative, her EKG shows normal sinus rhythm with no T wave inversions. Limited to a telemetry floor for 23 hour observation we'll cycle cardiac enzymes every 8 hours, we'll give her an aspirin and repeat an EKG in the morning. Also give her Tylenol for pain. If her workups is negative. We'll go ahead and schedule a stress as an outpatient. At this time I do not think she is having a PE, and she is not tachycardic or hypoxic.Marland KitchenThis is pericarditis that she doesn't have any significant EKG T changes or a history of pain that is positional..This is pneumonia as her chest x-ray is clear. Also check a TSH, B12 and a 2-D echo.  Asthma (02/24/2012)  stable continue home meds.  Diabetes mellitus (02/24/2012)  Will go ahead and continue home meds. We'll check hemoglobin A1c and continue sliding scale insulin.    Code  Status: Full code Family Communication: [phone]   Marinda Elk M.D. [facility] [phone] 02/24/2012, 7:10 PM

## 2012-02-24 NOTE — ED Notes (Signed)
Pt states she was at home and 1hr ago had sharp, shooting substernal cp. Pt then states she then had some diaphoresis and mild nausea. Denies vomiting, sob.

## 2012-02-24 NOTE — ED Notes (Signed)
PATIENT HAS BILATERAL AIR SPLINTS TO BOTH ANKLES. STATES SHE HAS TORN LIGAMENTS IN BOTH ANKLES. STATES SHE CAN WALK SOME BUT WHEN DISCUSSED THE TREADMILL WITH INCLINE AND PACE PT STATES SHE IS UNSURE ABOUT BEING ABLE TO COMPLETE TEST. NP MADE AWARE.

## 2012-02-24 NOTE — ED Notes (Addendum)
CBG Checked by EMT R Sammuel Hines

## 2012-02-24 NOTE — ED Provider Notes (Signed)
History     CSN: [id number]  Arrival date & time 02/24/12  1446   None     Chief Complaint  Patient presents with  . Chest Pain    (Consider location/radiation/quality/duration/timing/severity/associated sxs/prior treatment) Patient is a 61 y.o. female presenting with chest pain. The history is provided by the patient. No language interpreter was used.  Chest Pain The chest pain began 1 - 2 hours ago. Duration of episode(s) is 1 hour. Chest pain occurs constantly. The chest pain is improving. At its most intense, the pain is at 10/10. The pain is currently at 2/10. The severity of the pain is mild. The quality of the pain is described as dull and squeezing. Radiates to: r chest. Primary symptoms include fatigue, shortness of breath, wheezing, nausea and dizziness. Pertinent negatives for primary symptoms include no fever, no syncope, no cough, no palpitations, no abdominal pain and no vomiting.  Dizziness also occurs with nausea and diaphoresis. Dizziness does not occur with vomiting.   Associated symptoms include diaphoresis, near-syncope and numbness.  Pertinent negatives for associated symptoms include no lower extremity edema. She tried aspirin and nitroglycerin for the symptoms. Risk factors include obesity and lack of exercise.  Her past medical history is significant for anxiety/panic attacks, diabetes and sleep apnea.  Pertinent negatives for past medical history include no aortic aneurysm, no aortic dissection, no arrhythmia, no CAD, no cancer, no connective tissue disease, no COPD, no CHF, no DVT, no hyperlipidemia, no hypertension, no MI, no pacemaker, no PE, no rheumatic fever, no seizures, no sickle cell disease, no strokes, no thyroid problem, no TIA and no valve disorder.  Her family medical history is significant for CAD in family, diabetes in family, heart disease in family, hypertension in family, early MI in family and stroke in family.  Pertinent negatives for family  medical history include: no hyperlipidemia in family, no PE in family and no sudden death in family.  Procedure history is positive for exercise treadmill test.  Procedure history is negative for cardiac catheterization and echocardiogram.    patient arrived by EMS, and reports chest pain that started around 2:00 pm and is constant but comes in waves of pain. Patient was sitting at her desk working when acute onset of substernal chest pain came on that eventually radiated to her right chest. States the pain was a 10 out of 10 initially and now is a 2/10. States that the pain comes in waves it gets worse. The pain is lasted an hour and a half so far. Patient states that the pain made her dizzy and felt like she would pass out. Pain is reproducible presently. Dizziness has subsided. States that she did become nauseated but did not vomit. Patient has a remote history of GERD. She also has a past medical history of asthma diabetes and fibromyalgia. She has no gallbladder or appendix. She wears splints on bilateral feet because of ligament damage which is being assessed by Dr. Althea Charon. Patient resected nitroglycerin in route with some relief of pain.  Past Medical History  Diagnosis Date  . Diabetes mellitus   . Fibromyalgia   . Arthritis   . Asthma     History reviewed. No pertinent past surgical history.  History reviewed. No pertinent family history.  History  Substance Use Topics  . Smoking status: Former Games developer  . Smokeless tobacco: Not on file  . Alcohol Use: Yes     "rarely"    OB History    Grav Para Term  Preterm Abortions TAB SAB Ect Mult Living                  Review of Systems  Constitutional: Positive for diaphoresis and fatigue. Negative for fever.  Respiratory: Positive for shortness of breath and wheezing. Negative for cough.   Cardiovascular: Positive for chest pain and near-syncope. Negative for palpitations and syncope.  Gastrointestinal: Positive for nausea.  Negative for vomiting and abdominal pain.  Genitourinary: Negative for dysuria.  Musculoskeletal: Negative for back pain.  Neurological: Positive for dizziness and numbness. Negative for seizures.  Psychiatric/Behavioral: Negative.   All other systems reviewed and are negative.    Allergies  Review of patient's allergies indicates no known allergies.  Home Medications   Current Outpatient Rx  Name Route Sig Dispense Refill  . ALBUTEROL SULFATE HFA 108 (90 BASE) MCG/ACT IN AERS Inhalation Inhale 2 puffs into the lungs every 6 (six) hours as needed. For shortness of breath    . CETIRIZINE HCL 10 MG PO TABS Oral Take 10 mg by mouth daily.    . CYCLOBENZAPRINE HCL 10 MG PO TABS Oral Take 10 mg by mouth at bedtime as needed. For muscle spasms    . DULOXETINE HCL 30 MG PO CPEP Oral Take 30 mg by mouth daily.    Marland Kitchen KRILL OIL PO Oral Take 1 tablet by mouth daily.    . MELOXICAM 15 MG PO TABS Oral Take 15 mg by mouth every evening.    Marland Kitchen SAXAGLIPTIN-METFORMIN ER 2.03-999 MG PO TB24 Oral Take 1 tablet by mouth 2 (two) times daily.    . STUDY MEDICATION Inhalation Inhale 1 puff into the lungs 2 (two) times daily. Study medication similar to advair    . VITAMIN D (ERGOCALCIFEROL) 50000 UNITS PO CAPS Oral Take 50,000 Units by mouth 2 (two) times a week. Sunday and thursday      BP 135/66  Pulse 73  Temp(Src) 98 F (36.7 C) (Oral)  Resp 14  SpO2 99%  Physical Exam  Nursing note and vitals reviewed. Constitutional: She is oriented to person, place, and time. She appears well-developed and well-nourished.  HENT:  Head: Normocephalic and atraumatic.  Eyes: Conjunctivae and EOM are normal. Pupils are equal, round, and reactive to light.  Neck: Normal range of motion. Neck supple.  Cardiovascular: Normal rate, regular rhythm, normal heart sounds and intact distal pulses.  Exam reveals no gallop and no friction rub.   No murmur heard. Pulmonary/Chest: Effort normal and breath sounds normal.    Abdominal: Soft. Bowel sounds are normal.  Musculoskeletal: Normal range of motion. She exhibits no edema and no tenderness.  Neurological: She is alert and oriented to person, place, and time. She has normal reflexes.  Skin: Skin is warm and dry.  Psychiatric: She has a normal mood and affect.    ED Course  Procedures (including critical care time)   Labs Reviewed  CBC  DIFFERENTIAL  BASIC METABOLIC PANEL  URINALYSIS, ROUTINE W REFLEX MICROSCOPIC   No results found.   No diagnosis found.    MDM  Patient weighs too much for CT chest and she can not do walking stress test.  Will page hospitalists to admit r/o chest pain.   18:50  patient will be admitted by the hospitalist team 2 to rule out chest pain. EKG normal. Chest x-ray normal.  Heart score 4.  Pain 2/10 presently and reproducible.  Pain better after ntg in route.   Labs Reviewed  CBC - Abnormal; Notable for  the following:    MCV 75.4 (*)    MCH 25.2 (*)    All other components within normal limits  DIFFERENTIAL  BASIC METABOLIC PANEL  POCT I-STAT TROPONIN I  URINALYSIS, ROUTINE W REFLEX MICROSCOPIC  D-DIMER, QUANTITATIVE    Date: 02/24/2012  Rate:74  Rhythm: normal sinus rhythm  QRS Axis: normal  Intervals: normal  ST/T Wave abnormalities: normal  Conduction Disutrbances:none  Narrative Interpretation:   Old EKG Reviewed: unchanged          Remi Haggard, NP 02/24/12 1849

## 2012-02-25 ENCOUNTER — Encounter (HOSPITAL_COMMUNITY): Payer: Self-pay | Admitting: General Practice

## 2012-02-25 LAB — COMPREHENSIVE METABOLIC PANEL
BUN: 13 mg/dL (ref 6–23)
CO2: 28 mEq/L (ref 19–32)
Calcium: 9.4 mg/dL (ref 8.4–10.5)
Creatinine, Ser: 0.69 mg/dL (ref 0.50–1.10)
GFR calc Af Amer: >90 mL/min (ref >90)
GFR calc non Af Amer: >90 mL/min (ref >90)
Glucose, Bld: 114 mg/dL — ABNORMAL HIGH (ref 70–99)
Total Protein: 6.8 g/dL (ref 6.0–8.3)

## 2012-02-25 LAB — CARDIAC PANEL(CRET KIN+CKTOT+MB+TROPI)
CK, MB: 1.5 ng/mL (ref 0.3–4.0)
CK, MB: 1.5 ng/mL (ref 0.3–4.0)
Relative Index: INVALID (ref 0.0–2.5)
Total CK: 50 U/L (ref 7–177)
Total CK: 50 U/L (ref 7–177)

## 2012-02-25 LAB — GLUCOSE, CAPILLARY: Glucose-Capillary: 135 mg/dL — ABNORMAL HIGH (ref 70–99)

## 2012-02-25 LAB — TSH: TSH: 2.373 u[IU]/mL (ref 0.350–4.500)

## 2012-02-25 LAB — CBC
HCT: 35.7 % — ABNORMAL LOW (ref 36.0–46.0)
Hemoglobin: 12 g/dL (ref 12.0–15.0)
RDW: 14.1 % (ref 11.5–15.5)
WBC: 6.6 10*3/uL (ref 4.0–10.5)

## 2012-02-25 LAB — HEMOGLOBIN A1C: Hgb A1c MFr Bld: 7.4 % — ABNORMAL HIGH (ref <5.7)

## 2012-02-25 NOTE — Progress Notes (Signed)
   CARE MANAGEMENT NOTE 02/25/2012  Patient:  Pam [name], Pam [name]   Account Number:  [id number]  Date Initiated:  02/25/2012  Documentation initiated by:  Letha Cape  Subjective/Objective Assessment:   dx atypical chest pain  admit-lives alone. pta independent.     Action/Plan:   Anticipated DC Date:  02/25/2012   Anticipated DC Plan:  HOME/SELF CARE      DC Planning Services  CM consult      Choice offered to / List presented to:             Status of service:  Completed, signed off Medicare Important Message given?   (If response is "NO", the following Medicare IM given date fields will be blank) Date Medicare IM given:   Date Additional Medicare IM given:    Discharge Disposition:  HOME/SELF CARE  Per UR Regulation:    If discussed at Long Length of Stay Meetings, dates discussed:    Comments:  02/25/12 14:20 Letha Cape RN, BSN [phone] patient lives alone, pta independent.  Patient for dc to home today, no needs identified.

## 2012-02-25 NOTE — Discharge Summary (Signed)
Admit date: 02/24/2012 Discharge date: 02/25/2012  Primary Care Physician:  Gwynneth Aliment, MD, MD   Discharge Diagnoses:   Active Hospital Problems  Diagnoses Date Noted   . Asthma 02/24/2012   . Diabetes mellitus 02/24/2012     Resolved Hospital Problems  Diagnoses Date Noted Date Resolved  . Atypical chest pain 02/24/2012 02/25/2012     DISCHARGE MEDICATION: Medication List  As of 02/25/2012  8:42 AM   TAKE these medications         albuterol 108 (90 BASE) MCG/ACT inhaler   Commonly known as: PROVENTIL HFA;VENTOLIN HFA   Inhale 2 puffs into the lungs every 6 (six) hours as needed. For shortness of breath      cetirizine 10 MG tablet   Commonly known as: ZYRTEC   Take 10 mg by mouth daily.      cyclobenzaprine 10 MG tablet   Commonly known as: FLEXERIL   Take 10 mg by mouth at bedtime as needed. For muscle spasms      DULoxetine 30 MG capsule   Commonly known as: CYMBALTA   Take 30 mg by mouth daily.      KOMBIGLYZE XR 2.03-999 MG Tb24   Generic drug: Saxagliptin-Metformin   Take 1 tablet by mouth 2 (two) times daily.      KRILL OIL PO   Take 1 tablet by mouth daily.      meloxicam 15 MG tablet   Commonly known as: MOBIC   Take 15 mg by mouth every evening.      STUDY MEDICATION   Inhale 1 puff into the lungs 2 (two) times daily. Study medication similar to advair      Vitamin D (Ergocalciferol) 50000 UNITS Caps   Commonly known as: DRISDOL   Take 50,000 Units by mouth 2 (two) times a week. Sunday and thursday            SIGNIFICANT DIAGNOSTIC STUDIES:  Mr Ankle Right  Wo Contrast  02/08/2012  *RADIOLOGY REPORT*  Clinical Data: Pain.  Synovitis.  Medial right ankle pain and bruising.  Marland Kitchen  MRI OF THE RIGHT ANKLE WITHOUT CONTRAST  Technique:  Multiplanar, multisequence MR imaging was performed. No intravenous contrast was administered.  Comparison: None.  Findings: There is a longitudinal split tear of the posterior tibial tendon beginning at the level of  the medial malleolus and extending nearly to the insertion on the navicular bone. Tenosynovitis is present in the posterior tibial and flexor digitorum longus tendon sheaths.  The flexor Hallucis longus tendon appears normal.  There is also a longitudinal split tear of the peroneus brevis tendon extending from retrofibular groove to the peroneal or tubercle.  Tenosynovitis of the peroneal tendons is also present. Distal tibiofibular joint appears normal.  The lateral ankle ligaments appear intact.  The deltoid ligament is intact.  Plantar fascia and Achilles tendon appear normal.  There is some mild edema in the sinus tarsi without complete effacement of sinus tarsi fat or remodeling.  This may be reactive or degenerative. Small ankle and subtalar effusion is present.  The anterior tendon group appears normal.  IMPRESSION:  1.  Longitudinal split of the posterior tibial tendon extending from the medial malleolus to insertion on the navicular bone. Associated tenosynovitis of the posterior tibial tendon and flexor digitorum longus tendon. 2.  Longitudinal split tear of the peroneus brevis tendon extending from retrofibular groove to the peroneal tubercle. 3.  Ankle and subtalar effusion, probably reactive.  No definite synovitis is identified on  this noncontrast MRI however effusion and acute synovitis can appear similar in the absence of contrast.  Original Report Authenticated By: Andreas Newport, M.D.   Dg Chest Port 1 View  02/24/2012  *RADIOLOGY REPORT*  Clinical Data: Chest pain, diabetes  PORTABLE CHEST - 1 VIEW  Comparison: 04/30/2010  Findings: Cardiomediastinal silhouette is stable.  No acute infiltrate or pleural effusion.  No pulmonary edema.  Bony thorax is stable.  IMPRESSION: No active disease.  No significant change.  Original Report Authenticated By: Natasha Mead, M.D.     ECHO:pending at the time of this dication    No results found for this or any previous visit (from the past 240  hour(s)).  BRIEF ADMITTING H & P:  61 year old female with past medical history of diabetes former smoker that comes in complaining of shortness of breath and chest pain this started today at 2 PM. She relates no new exercise or new strenuous exercise. She relates the chest pain started suddenly accompanied with hot flashe and shortness of breath and she can feel her heart beating faster. She also relates her pain is made worse by deep inspiration. relates the pain is not made worse by exertion is made worse by taking a deep breath, better with nitroglycerin. She relates no sweating during these episodes, no nausea no vomiting no diarrhea.  Active Hospital Problems  Diagnoses Date Noted   . Asthma: Stable no changes. 02/24/2012   . Diabetes mellitus: Stable no changes. 02/24/2012     Resolved Hospital Problems  Diagnoses Date Noted Date Resolved  . Atypical chest pain: Admitted to telemetry no events on monitor. D-dimer 0.34. Cardiac marker cycle times 3 that were negative her echo is pending at the time of this dictation. She was schedule  A stress test as an outpatient. Pain is still reproducible by palpation. 02/24/2012 02/25/2012     Disposition and Follow-up:   Discharge Orders    Future Orders Please Complete By Expires   Diet - low sodium heart healthy      Increase activity slowly        Follow-up Information    Follow up with Gwynneth Aliment, MD in 2 weeks. (results follow up)    Contact information:   [address] [place] [address] [phone]           DISCHARGE EXAM:  General: Alert, awake, oriented x3, in no acute distress.  HEENT: No bruits, no goiter.  Heart: Regular rate and rhythm, without murmurs, rubs, gallops.  Lungs: good air movement  CTA b/L pain reproducible. Abdomen: Soft, nontender, nondistended, positive bowel sounds.  Neuro: Grossly intact, nonfocal.   Blood pressure 132/79, pulse 68, temperature 97.7 F (36.5  C), temperature source Oral, resp. rate 17, height 5\' 2"  (1.575 m), weight 87 kg (191 lb 12.8 oz), SpO2 95.00%.   Basename 02/25/12 0303 02/24/12 2200 02/24/12 1459  NA 140 -- 140  K 3.5 -- 3.8  CL 103 -- 104  CO2 28 -- 27  GLUCOSE 114* -- 88  BUN 13 -- 12  CREATININE 0.69 0.64 --  CALCIUM 9.4 -- 9.6  MG -- -- --  PHOS -- -- --    Basename 02/25/12 0303  AST 14  ALT 17  ALKPHOS 60  BILITOT 0.2*  PROT 6.8  ALBUMIN 3.3*   No results found for this basename: LIPASE:2,AMYLASE:2 in the last 72 hours  Basename 02/25/12 0303 02/24/12 2200 02/24/12 1459  WBC 6.6 7.2 --  NEUTROABS -- --  3.0  HGB 12.0 12.1 --  HCT 35.7* 35.1* --  MCV 75.6* 74.5* --  PLT 281 288 --    Signed: Marinda Elk M.D. 02/25/2012, 8:42 AM

## 2012-02-25 NOTE — Progress Notes (Signed)
  Echocardiogram 2D Echocardiogram has been performed.  Manasseh Pittsley L 02/25/2012, 11:08 AM

## 2012-02-25 NOTE — ED Provider Notes (Signed)
Medical screening examination/treatment/procedure(s) were performed by non-physician practitioner and as supervising physician I was immediately available for consultation/collaboration.   Laray Anger, DO 02/25/12 1022

## 2012-02-25 NOTE — Progress Notes (Signed)
Pt. Discharged 02/25/2012  12:35 PM Discharge instructions reviewed with patient/family. Patient/family verbalized understanding. All Rx's given. Questions answered as needed. Pt. Discharged to home with family/self.  [address]

## 2012-02-25 NOTE — Progress Notes (Signed)
ECHO completed, DC information and papers given to pt, IV and tele DC'd per MD order.  Waiting in room for ride to be here approx [address]

## 2012-02-25 NOTE — Progress Notes (Signed)
Utilization review completed.  

## 2012-05-27 ENCOUNTER — Other Ambulatory Visit: Payer: Self-pay | Admitting: Internal Medicine

## 2012-05-27 DIAGNOSIS — Z1231 Encounter for screening mammogram for malignant neoplasm of breast: Secondary | ICD-10-CM

## 2012-05-28 ENCOUNTER — Ambulatory Visit
Admission: RE | Admit: 2012-05-28 | Discharge: 2012-05-28 | Disposition: A | Discharge status: Home / Self Care | Payer: BC Managed Care – PPO | Source: Ambulatory Visit | Attending: Internal Medicine | Admitting: Internal Medicine

## 2012-05-28 DIAGNOSIS — Z1231 Encounter for screening mammogram for malignant neoplasm of breast: Secondary | ICD-10-CM

## 2013-08-19 DIAGNOSIS — J45909 Unspecified asthma, uncomplicated: Secondary | ICD-10-CM | POA: Insufficient documentation

## 2013-08-19 DIAGNOSIS — J4540 Moderate persistent asthma, uncomplicated: Secondary | ICD-10-CM | POA: Insufficient documentation

## 2013-08-19 DIAGNOSIS — E669 Obesity, unspecified: Secondary | ICD-10-CM | POA: Insufficient documentation

## 2013-08-19 HISTORY — DX: Unspecified asthma, uncomplicated: J45.909

## 2013-08-22 DIAGNOSIS — R197 Diarrhea, unspecified: Secondary | ICD-10-CM

## 2013-08-22 DIAGNOSIS — G4700 Insomnia, unspecified: Secondary | ICD-10-CM

## 2013-08-22 DIAGNOSIS — E1169 Type 2 diabetes mellitus with other specified complication: Secondary | ICD-10-CM | POA: Insufficient documentation

## 2013-08-22 DIAGNOSIS — E785 Hyperlipidemia, unspecified: Secondary | ICD-10-CM | POA: Insufficient documentation

## 2013-08-22 DIAGNOSIS — E559 Vitamin D deficiency, unspecified: Secondary | ICD-10-CM | POA: Insufficient documentation

## 2013-08-22 DIAGNOSIS — E7800 Pure hypercholesterolemia, unspecified: Secondary | ICD-10-CM | POA: Insufficient documentation

## 2013-08-22 HISTORY — DX: Diarrhea, unspecified: R19.7

## 2013-08-22 HISTORY — DX: Insomnia, unspecified: G47.00

## 2013-08-27 ENCOUNTER — Other Ambulatory Visit: Payer: Self-pay

## 2013-08-27 DIAGNOSIS — Z1231 Encounter for screening mammogram for malignant neoplasm of breast: Secondary | ICD-10-CM

## 2013-09-22 ENCOUNTER — Ambulatory Visit
Admission: RE | Admit: 2013-09-22 | Discharge: 2013-09-22 | Disposition: A | Discharge status: Home / Self Care | Payer: BC Managed Care – PPO | Source: Ambulatory Visit

## 2013-09-22 DIAGNOSIS — Z1231 Encounter for screening mammogram for malignant neoplasm of breast: Secondary | ICD-10-CM

## 2013-11-19 ENCOUNTER — Ambulatory Visit (INDEPENDENT_AMBULATORY_CARE_PROVIDER_SITE_OTHER): Payer: BC Managed Care – PPO | Admitting: Family Medicine

## 2013-11-19 ENCOUNTER — Ambulatory Visit: Payer: BC Managed Care – PPO

## 2013-11-19 VITALS — BP 110/70 | HR 77 | Temp 98.5°F | Resp 22 | Ht 77.0 in | Wt 175.0 lb

## 2013-11-19 DIAGNOSIS — R059 Cough, unspecified: Secondary | ICD-10-CM

## 2013-11-19 DIAGNOSIS — R0789 Other chest pain: Secondary | ICD-10-CM

## 2013-11-19 DIAGNOSIS — R05 Cough: Secondary | ICD-10-CM

## 2013-11-19 DIAGNOSIS — E119 Type 2 diabetes mellitus without complications: Secondary | ICD-10-CM

## 2013-11-19 DIAGNOSIS — R062 Wheezing: Secondary | ICD-10-CM

## 2013-11-19 DIAGNOSIS — J4521 Mild intermittent asthma with (acute) exacerbation: Secondary | ICD-10-CM

## 2013-11-19 LAB — POCT CBC
Granulocyte percent: 49 %G (ref 37–80)
HEMATOCRIT: 42.4 % (ref 37.7–47.9)
Hemoglobin: 12.9 g/dL (ref 12.2–16.2)
LYMPH, POC: 2.6 (ref 0.6–3.4)
MCH, POC: 24.8 pg — AB (ref 27–31.2)
MCHC: 30.4 g/dL — AB (ref 31.8–35.4)
MCV: 81.5 fL (ref 80–97)
MID (cbc): 0.3 (ref 0–0.9)
MPV: 7.6 fL (ref 0–99.8)
POC GRANULOCYTE: 2.8 (ref 2–6.9)
POC LYMPH %: 45.4 % (ref 10–50)
POC MID %: 5.6 % (ref 0–12)
Platelet Count, POC: 350 10*3/uL (ref 142–424)
RBC: 5.2 M/uL (ref 4.04–5.48)
RDW, POC: 15.4 %
WBC: 5.8 10*3/uL (ref 4.6–10.2)

## 2013-11-19 LAB — GLUCOSE, POCT (MANUAL RESULT ENTRY): POC GLUCOSE: 85 mg/dL (ref 70–99)

## 2013-11-19 MED ORDER — ALBUTEROL SULFATE HFA 108 (90 BASE) MCG/ACT IN AERS: 2.0000 | INHALATION_SPRAY | Freq: Four times a day (QID) | RESPIRATORY_TRACT | Status: DC | PRN

## 2013-11-19 MED ORDER — PREDNISONE 20 MG PO TABS: ORAL_TABLET | ORAL | Status: DC

## 2013-11-19 MED ORDER — AZITHROMYCIN 250 MG PO TABS: ORAL_TABLET | ORAL | Status: DC

## 2013-11-19 NOTE — Patient Instructions (Addendum)
Drink plenty of fluids get enough rest  Use the albuterol inhaler 2 inhalations every 4-6 hours  Take the prednisone 3 daily for 2 days, then daily for 2 days, then one daily for 2 days.  It will elevate your sugar transiently  Take the azithromycin 2 pills initially, then one daily for 4 days  If abruptly worse go to the emergency room. If not improving over the next for 5 days return for recheck

## 2013-11-19 NOTE — Progress Notes (Signed)
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  about a week ago she started having a respiratory tract infection with congestion and coughing. She did not return if fever. She was doing better from that until today she abruptly got much worse. She got up this morning with a tight feeling in her chest. She was wheezing some. She has coughed up some phlegm which is changed from being cleared to being a milky white. She is a nonsmoker and generally is healthy. She had an extensive heart evaluation done a couple of years ago and everything checked out well. She is a diabetic. She has not been checking her sugars very often.  Objective: Pleasant alert lady who doesn't feel well. Her TMs are normal. Throat clear. Sinuses a little tender. Neck supple without significant nodes. Chest has soft wheezing, especially at the right base. Heart regular without murmurs.  Assessment: Chest tightness Asthmatic bronchitis Rule out pneumonia History of diabetes  Plan: Chest x-ray, CBC, glucose, EKG  Results for orders placed in visit on 11/19/13  POCT CBC      Result Value Range   WBC 5.8  4.6 - 10.2 K/uL   Lymph, poc 2.6  0.6 - 3.4   POC LYMPH PERCENT 45.4  10 - 50 %L   MID (cbc) 0.3  0 - 0.9   POC MID % 5.6  0 - 12 %M   POC Granulocyte 2.8  2 - 6.9   Granulocyte percent 49.0  37 - 80 %G   RBC 5.20  4.04 - 5.48 M/uL   Hemoglobin 12.9  12.2 - 16.2 g/dL   HCT, POC 42.4  37.7 - 47.9 %   MCV 81.5  80 - 97 fL   MCH, POC 24.8 (*) 27 - 31.2 pg   MCHC 30.4 (*) 31.8 - 35.4 g/dL   RDW, POC 15.4     Platelet Count, POC 350  142 - 424 K/uL   MPV 7.6  0 - 99.8 fL  GLUCOSE, POCT (MANUAL RESULT ENTRY)      Result Value Range   POC Glucose 85  70 - 99 mg/dl  [facility] reading (PRIMARY) by  Dr. Linna Darner Normal CXR  EKG normal  Assessment: Asthmatic bronchitis  Plan: Brief taper of steroids. Inhaler. Z-Pak. Return if not improving. ER if abruptly worse

## 2013-11-25 DIAGNOSIS — J069 Acute upper respiratory infection, unspecified: Secondary | ICD-10-CM | POA: Insufficient documentation

## 2013-11-25 DIAGNOSIS — K529 Noninfective gastroenteritis and colitis, unspecified: Secondary | ICD-10-CM | POA: Insufficient documentation

## 2013-11-25 DIAGNOSIS — N950 Postmenopausal bleeding: Secondary | ICD-10-CM | POA: Insufficient documentation

## 2013-11-25 HISTORY — DX: Noninfective gastroenteritis and colitis, unspecified: K52.9

## 2013-11-25 HISTORY — DX: Acute upper respiratory infection, unspecified: J06.9

## 2013-11-25 HISTORY — DX: Postmenopausal bleeding: N95.0

## 2014-01-19 ENCOUNTER — Other Ambulatory Visit: Payer: Self-pay | Admitting: Obstetrics and Gynecology

## 2014-04-01 ENCOUNTER — Other Ambulatory Visit: Payer: Self-pay | Admitting: Obstetrics and Gynecology

## 2014-04-06 ENCOUNTER — Other Ambulatory Visit (HOSPITAL_COMMUNITY): Payer: Self-pay | Admitting: Obstetrics and Gynecology

## 2014-04-06 ENCOUNTER — Encounter (HOSPITAL_COMMUNITY): Payer: Self-pay

## 2014-04-06 ENCOUNTER — Other Ambulatory Visit: Payer: Self-pay

## 2014-04-06 ENCOUNTER — Encounter (HOSPITAL_COMMUNITY)
Admission: RE | Admit: 2014-04-06 | Discharge: 2014-04-06 | Disposition: A | Discharge status: Home / Self Care | Payer: BC Managed Care – PPO | Source: Ambulatory Visit | Attending: Obstetrics and Gynecology | Admitting: Obstetrics and Gynecology

## 2014-04-06 HISTORY — DX: Inflammatory liver disease, unspecified: K75.9

## 2014-04-06 HISTORY — DX: Headache: R51

## 2014-04-06 HISTORY — DX: Gastro-esophageal reflux disease without esophagitis: K21.9

## 2014-04-06 HISTORY — DX: Sleep apnea, unspecified: G47.30

## 2014-04-06 HISTORY — DX: Respiratory tuberculosis unspecified: A15.9

## 2014-04-06 HISTORY — DX: Anemia, unspecified: D64.9

## 2014-04-06 LAB — BASIC METABOLIC PANEL
BUN: 10 mg/dL (ref 6–23)
CALCIUM: 10 mg/dL (ref 8.4–10.5)
CO2: 28 mEq/L (ref 19–32)
Chloride: 102 mEq/L (ref 96–112)
Creatinine, Ser: 0.61 mg/dL (ref 0.50–1.10)
Glucose, Bld: 87 mg/dL (ref 70–99)
POTASSIUM: 4.5 meq/L (ref 3.7–5.3)
Sodium: 141 mEq/L (ref 137–147)

## 2014-04-06 LAB — CBC
HEMATOCRIT: 38.4 % (ref 36.0–46.0)
Hemoglobin: 12.9 g/dL (ref 12.0–15.0)
MCH: 25.4 pg — AB (ref 26.0–34.0)
MCHC: 33.6 g/dL (ref 30.0–36.0)
MCV: 75.7 fL — ABNORMAL LOW (ref 78.0–100.0)
Platelets: 298 10*3/uL (ref 150–400)
RBC: 5.07 MIL/uL (ref 3.87–5.11)
RDW: 14 % (ref 11.5–15.5)
WBC: 6.2 10*3/uL (ref 4.0–10.5)

## 2014-04-06 NOTE — Patient Instructions (Addendum)
Your procedure is scheduled on: Friday, Apr 08, 2014  Enter through the [employer] [facility] at: 7:30am  Pick up the phone at the desk and dial [phone].  Call this number if you have problems the morning of surgery: [phone].  Remember: Do NOT eat food: After midnight Thursday Do NOT drink clear liquids after: After midnight Thursday Take these medicines the morning of surgery with a SIP OF WATER: Wellbutrin **Do NOT take Kombiglyze 24 hours prior to surgery *Bring asthma inhaler day of surgery  Do NOT wear jewelry (body piercing), metal hair clips/bobby pins, make-up, or nail polish. Do NOT wear lotions, powders, or perfumes.  You may wear deoderant. Do NOT shave for 48 hours prior to surgery. Do NOT bring valuables to the hospital. Contacts, dentures, or bridgework may not be worn into surgery.  Have a responsible adult drive you home and stay with you for 24 hours after your procedure

## 2014-04-06 NOTE — H&P (Signed)
Pam [name] is a 63 y.o.  63 YO female  P 0-0-2-0 for hysteroscopy because of post menopausal bleeding. Since 1996 the patient states that she has had random vaginal spotting but in November 2014 began to have more frequent episodes. In March of 2015 had an endometrial biopsy that showed benign endocervical cells but no endometrial cells identified.  A pelvic ultrasound at that same time showed: uterus: 4.60 x 4.12 x 4.46 cm, endometrium-1.67 mm with #3 intramural fibroids: 3.3 cm, 1.47 cm and 2.67 cm with normal appearing ovaries.  Uterine length from fundus to external os - 7.1 cm.   Since her endometrial biopsy,  the patient reports almost daily spotting.  Denies any changes in bowel or bladder function and has not had any vaginitis symptoms.  She does report, however, a pinching pain that occurs randomly that will "stop her in her tracks" with no known trigger.  This pain only lasts for a second but will leave a dull discomfort primarily in the right lower quadrant.  Given her post menopausal bleeding along with other symptoms the patient desires further evaluation by undergoing a hysteroscopy with possible endometrial resection along with D & C.   Past Medical History  OB History: G 2:  P 0-0-2-0  GYN History: menarche: 62 YO    LMP: Menopausal ; Remote  history of abnormal PAP smear but when repeated returned normal; Last PAP smear: 12/2013  Medical History: Diabetes Mellitus, colon polyps (benign), IBS, GERD, Fibromyalgia, Neuropathy, Vitamin D Deficiency, Murmur, Anxiety, Depression, PMDD, Asthma, Incontinence, and Diverticular Disease  Surgical History: 1986  Breast Reduction;  2002  Cholecystectomy;  2012 Appendectomy Difficult to awaken from anesthesia but no  history of blood transfusions  Family History: Cervical and Bladder Cancer, Stroke, Hypertension, Heart Disease, Diabetes Mellitus, Thyroid Disease, Osteoporosis, and Dementia  Social History: Single and employed as a Professor at  [employer]; Former Smoker and Occasional Alcohol Intake   Medications:  Budesomide 32 mcg Nasal Spray Daily Buproprion HCL  XL 150 mg Daily Kombiglyze  XR 25 mg  Daily Proventil Inhalter HFA  As Directed PRN Restasis 0.05%  In Each Eye PRN  No Known Allergies  Denies sensitivity to peanuts, shellfish, soy, latex or adhesives.  ROS: Admits to glasses, occasional leakage of urine with laughing, shortness of breath with asthma, diarrhea due to Metformin, knee/ankly/hand swelling on occasion but  denies headache, vision changes, nasal congestion, dysphagia, tinnitus, dizziness, hoarseness, cough,  chest pain,  nausea, vomiting, constipation,  urinary frequency, urgency  dysuria, hematuria, vaginitis symptoms, pelvic pain, swelling of joints, easy bruising,  myalgias, arthralgias, skin rashes, unexplained weight loss and except as is mentioned in the history of present illness, patient's review of systems is otherwise negative.  Physical Exam  Bp: 108/52   P: 72   R: 16   Temperature: 98.5 degrees F orally;  Weight: 174 lbs.   Height: 5\' 1"    BMI: 34  Neck: supple without masses or thyromegaly Lungs: clear to auscultation Heart: regular rate and rhythm with 1/6 systolic murmur Abdomen: soft, non-tender and no organomegaly Pelvic:EGBUS- atrophic vagina-atrophic ; uterus-normal size, cervix without lesions or motion tenderness; adnexae-no tenderness or masses Extremities:  no clubbing, cyanosis or edema   Assesment: Post Menopausal Bleeding              Disposition:  A discussion was held with patient regarding the indication for her procedure(s) along with the risks, which include but are not limited to:  reaction to anesthesia, damage to adjacent organs, infection and excessive bleeding. The patient verbalized understanding of these risks and has consented to proceed with Hysteroscopy, Dilatation, Curettage with Possible Endometrial Resection at  [facility]  on 04/08/14.    CSN# [id number]   Lene Mckay J. Florene Glen, PA-C  for Dr. Franklyn Lor. Dillard

## 2014-04-08 ENCOUNTER — Ambulatory Visit (HOSPITAL_COMMUNITY): Payer: BC Managed Care – PPO | Admitting: Anesthesiology

## 2014-04-08 ENCOUNTER — Encounter (HOSPITAL_COMMUNITY): Payer: BC Managed Care – PPO | Admitting: Anesthesiology

## 2014-04-08 ENCOUNTER — Ambulatory Visit (HOSPITAL_COMMUNITY)
Admission: RE | Admit: 2014-04-08 | Discharge: 2014-04-08 | Disposition: A | Discharge status: Home / Self Care | Payer: BC Managed Care – PPO | Source: Ambulatory Visit | Attending: Obstetrics and Gynecology | Admitting: Obstetrics and Gynecology

## 2014-04-08 ENCOUNTER — Encounter (HOSPITAL_COMMUNITY): Payer: Self-pay | Admitting: Anesthesiology

## 2014-04-08 ENCOUNTER — Encounter (HOSPITAL_COMMUNITY)
Admission: RE | Disposition: A | Discharge status: Home / Self Care | Payer: Self-pay | Source: Ambulatory Visit | Attending: Obstetrics and Gynecology

## 2014-04-08 DIAGNOSIS — IMO0001 Reserved for inherently not codable concepts without codable children: Secondary | ICD-10-CM | POA: Insufficient documentation

## 2014-04-08 DIAGNOSIS — M797 Fibromyalgia: Secondary | ICD-10-CM

## 2014-04-08 DIAGNOSIS — Z87891 Personal history of nicotine dependence: Secondary | ICD-10-CM | POA: Insufficient documentation

## 2014-04-08 DIAGNOSIS — N950 Postmenopausal bleeding: Secondary | ICD-10-CM | POA: Insufficient documentation

## 2014-04-08 DIAGNOSIS — K219 Gastro-esophageal reflux disease without esophagitis: Secondary | ICD-10-CM | POA: Insufficient documentation

## 2014-04-08 DIAGNOSIS — E119 Type 2 diabetes mellitus without complications: Secondary | ICD-10-CM | POA: Insufficient documentation

## 2014-04-08 DIAGNOSIS — N952 Postmenopausal atrophic vaginitis: Secondary | ICD-10-CM | POA: Insufficient documentation

## 2014-04-08 DIAGNOSIS — D251 Intramural leiomyoma of uterus: Secondary | ICD-10-CM | POA: Insufficient documentation

## 2014-04-08 DIAGNOSIS — K589 Irritable bowel syndrome without diarrhea: Secondary | ICD-10-CM | POA: Insufficient documentation

## 2014-04-08 HISTORY — PX: DILATATION & CURRETTAGE/HYSTEROSCOPY WITH RESECTOCOPE: SHX5572

## 2014-04-08 LAB — GLUCOSE, CAPILLARY
GLUCOSE-CAPILLARY: 109 mg/dL — AB (ref 70–99)
GLUCOSE-CAPILLARY: 102 mg/dL — AB (ref 70–99)

## 2014-04-08 SURGERY — DILATATION & CURETTAGE/HYSTEROSCOPY WITH RESECTOCOPE
Anesthesia: General | Site: Uterus

## 2014-04-08 MED ORDER — LIDOCAINE HCL 2 % IJ SOLN
INTRAMUSCULAR | Status: AC
  Filled 2014-04-08: qty 20

## 2014-04-08 MED ORDER — FENTANYL CITRATE 0.05 MG/ML IJ SOLN
INTRAMUSCULAR | Status: AC
  Filled 2014-04-08: qty 5

## 2014-04-08 MED ORDER — PROPOFOL 10 MG/ML IV EMUL
INTRAVENOUS | Status: AC
  Filled 2014-04-08: qty 20

## 2014-04-08 MED ORDER — ONDANSETRON HCL 4 MG/2ML IJ SOLN
INTRAMUSCULAR | Status: AC
  Filled 2014-04-08: qty 2

## 2014-04-08 MED ORDER — SILVER NITRATE-POT NITRATE 75-25 % EX MISC
CUTANEOUS | Status: AC
  Filled 2014-04-08: qty 1

## 2014-04-08 MED ORDER — HYDROCODONE-ACETAMINOPHEN 5-325 MG PO TABS: 1.0000 | ORAL_TABLET | Freq: Four times a day (QID) | ORAL | Status: DC | PRN

## 2014-04-08 MED ORDER — KETOROLAC TROMETHAMINE 30 MG/ML IJ SOLN
INTRAMUSCULAR | Status: AC
  Filled 2014-04-08: qty 1

## 2014-04-08 MED ORDER — MEPERIDINE HCL 25 MG/ML IJ SOLN: 6.2500 mg | INTRAMUSCULAR | Status: DC | PRN

## 2014-04-08 MED ORDER — LIDOCAINE HCL (CARDIAC) 20 MG/ML IV SOLN
INTRAVENOUS | Status: DC | PRN
  Administered 2014-04-08: 60 mg via INTRAVENOUS

## 2014-04-08 MED ORDER — LIDOCAINE HCL (CARDIAC) 20 MG/ML IV SOLN
INTRAVENOUS | Status: AC
  Filled 2014-04-08: qty 5

## 2014-04-08 MED ORDER — ONDANSETRON HCL 4 MG/2ML IJ SOLN
INTRAMUSCULAR | Status: DC | PRN
  Administered 2014-04-08: 4 mg via INTRAVENOUS

## 2014-04-08 MED ORDER — PHENYLEPHRINE 40 MCG/ML (10ML) SYRINGE FOR IV PUSH (FOR BLOOD PRESSURE SUPPORT)
PREFILLED_SYRINGE | INTRAVENOUS | Status: AC
  Filled 2014-04-08: qty 10

## 2014-04-08 MED ORDER — MIDAZOLAM HCL 2 MG/2ML IJ SOLN
INTRAMUSCULAR | Status: DC | PRN
  Administered 2014-04-08: 1 mg via INTRAVENOUS

## 2014-04-08 MED ORDER — ONDANSETRON HCL 4 MG/2ML IJ SOLN: 4.0000 mg | Freq: Once | INTRAMUSCULAR | Status: DC | PRN

## 2014-04-08 MED ORDER — KETOROLAC TROMETHAMINE 30 MG/ML IJ SOLN: 15.0000 mg | Freq: Once | INTRAMUSCULAR | Status: DC | PRN

## 2014-04-08 MED ORDER — IBUPROFEN 600 MG PO TABS: 600.0000 mg | ORAL_TABLET | Freq: Four times a day (QID) | ORAL | Status: DC | PRN

## 2014-04-08 MED ORDER — LIDOCAINE HCL 2 % IJ SOLN
INTRAMUSCULAR | Status: DC | PRN
  Administered 2014-04-08: 10 mL

## 2014-04-08 MED ORDER — LACTATED RINGERS IV SOLN
INTRAVENOUS | Status: DC
  Administered 2014-04-08 (×2): via INTRAVENOUS

## 2014-04-08 MED ORDER — FENTANYL CITRATE 0.05 MG/ML IJ SOLN
INTRAMUSCULAR | Status: AC
  Filled 2014-04-08: qty 2

## 2014-04-08 MED ORDER — FENTANYL CITRATE 0.05 MG/ML IJ SOLN
INTRAMUSCULAR | Status: DC | PRN
  Administered 2014-04-08: 50 ug via INTRAVENOUS

## 2014-04-08 MED ORDER — GLYCINE 1.5 % IR SOLN
Status: DC | PRN
  Administered 2014-04-08: 1

## 2014-04-08 MED ORDER — FENTANYL CITRATE 0.05 MG/ML IJ SOLN
25.0000 ug | INTRAMUSCULAR | Status: DC | PRN
  Administered 2014-04-08: 25 ug via INTRAVENOUS

## 2014-04-08 MED ORDER — KETOROLAC TROMETHAMINE 30 MG/ML IJ SOLN
INTRAMUSCULAR | Status: DC | PRN
  Administered 2014-04-08: 30 mg via INTRAVENOUS

## 2014-04-08 MED ORDER — PROPOFOL 10 MG/ML IV BOLUS
INTRAVENOUS | Status: DC | PRN
  Administered 2014-04-08: 150 mg via INTRAVENOUS

## 2014-04-08 MED ORDER — PHENYLEPHRINE HCL 10 MG/ML IJ SOLN
INTRAMUSCULAR | Status: DC | PRN
  Administered 2014-04-08: 40 ug via INTRAVENOUS
  Administered 2014-04-08: 80 ug via INTRAVENOUS
  Administered 2014-04-08: 40 ug via INTRAVENOUS
  Administered 2014-04-08: 80 ug via INTRAVENOUS

## 2014-04-08 MED ORDER — DOXYCYCLINE HYCLATE 50 MG PO CAPS: 100.0000 mg | ORAL_CAPSULE | Freq: Two times a day (BID) | ORAL | Status: AC

## 2014-04-08 MED ORDER — MIDAZOLAM HCL 2 MG/2ML IJ SOLN
INTRAMUSCULAR | Status: AC
  Filled 2014-04-08: qty 2

## 2014-04-08 SURGICAL SUPPLY — 22 items
CANISTER SUCT 3000ML (MISCELLANEOUS) ×2 IMPLANT
CATH ROBINSON RED A/P 16FR (CATHETERS) ×2 IMPLANT
CLOTH BEACON ORANGE TIMEOUT ST (SAFETY) ×2 IMPLANT
CONTAINER PREFILL 10% NBF 60ML (FORM) ×4 IMPLANT
DRAPE HYSTEROSCOPY (DRAPE) ×2 IMPLANT
DRSG TELFA 3X8 NADH (GAUZE/BANDAGES/DRESSINGS) ×2 IMPLANT
ELECT REM PT RETURN 9FT ADLT (ELECTROSURGICAL) ×2
ELECTRODE REM PT RTRN 9FT ADLT (ELECTROSURGICAL) ×1 IMPLANT
GLOVE BIO SURGEON STRL SZ 6.5 (GLOVE) ×2 IMPLANT
GLOVE BIOGEL PI IND STRL 7.0 (GLOVE) ×1 IMPLANT
GLOVE BIOGEL PI INDICATOR 7.0 (GLOVE) ×1
GOWN STRL REUS W/TWL LRG LVL3 (GOWN DISPOSABLE) ×4 IMPLANT
LOOP ANGLED CUTTING 22FR (CUTTING LOOP) IMPLANT
NEEDLE SPNL 22GX3.5 QUINCKE BK (NEEDLE) ×2 IMPLANT
PACK VAGINAL MINOR WOMEN LF (CUSTOM PROCEDURE TRAY) ×2 IMPLANT
PAD OB MATERNITY 4.3X12.25 (PERSONAL CARE ITEMS) ×2 IMPLANT
SET TUBING HYSTEROSCOPY 2 NDL (TUBING) ×2 IMPLANT
SYR 20CC LL (SYRINGE) ×2 IMPLANT
SYR CONTROL 10ML LL (SYRINGE) ×2 IMPLANT
TOWEL OR 17X24 6PK STRL BLUE (TOWEL DISPOSABLE) ×4 IMPLANT
TUBE HYSTEROSCOPY W Y-CONNECT (TUBING) ×2 IMPLANT
WATER STERILE IRR 1000ML POUR (IV SOLUTION) ×2 IMPLANT

## 2014-04-08 NOTE — H&P (View-Only) (Signed)
Pam [name] is a 63 y.o.  63 YO female  P 0-0-2-0 for hysteroscopy because of post menopausal bleeding. Since 1996 the patient states that she has had random vaginal spotting but in November 2014 began to have more frequent episodes. In March of 2015 had an endometrial biopsy that showed benign endocervical cells but no endometrial cells identified.  A pelvic ultrasound at that same time showed: uterus: 4.60 x 4.12 x 4.46 cm, endometrium-1.67 mm with #3 intramural fibroids: 3.3 cm, 1.47 cm and 2.67 cm with normal appearing ovaries.  Uterine length from fundus to external os - 7.1 cm.   Since her endometrial biopsy,  the patient reports almost daily spotting.  Denies any changes in bowel or bladder function and has not had any vaginitis symptoms.  She does report, however, a pinching pain that occurs randomly that will "stop her in her tracks" with no known trigger.  This pain only lasts for a second but will leave a dull discomfort primarily in the right lower quadrant.  Given her post menopausal bleeding along with other symptoms the patient desires further evaluation by undergoing a hysteroscopy with possible endometrial resection along with D & C.   Past Medical History  OB History: G 2:  P 0-0-2-0  GYN History: menarche: 62 YO    LMP: Menopausal ; Remote  history of abnormal PAP smear but when repeated returned normal; Last PAP smear: 12/2013  Medical History: Diabetes Mellitus, colon polyps (benign), IBS, GERD, Fibromyalgia, Neuropathy, Vitamin D Deficiency, Murmur, Anxiety, Depression, PMDD, Asthma, Incontinence, and Diverticular Disease  Surgical History: 1986  Breast Reduction;  2002  Cholecystectomy;  2012 Appendectomy Difficult to awaken from anesthesia but no  history of blood transfusions  Family History: Cervical and Bladder Cancer, Stroke, Hypertension, Heart Disease, Diabetes Mellitus, Thyroid Disease, Osteoporosis, and Dementia  Social History: Single and employed as a Professor at  [employer]; Former Smoker and Occasional Alcohol Intake   Medications:  Budesomide 32 mcg Nasal Spray Daily Buproprion HCL  XL 150 mg Daily Kombiglyze  XR 25 mg  Daily Proventil Inhalter HFA  As Directed PRN Restasis 0.05%  In Each Eye PRN  No Known Allergies  Denies sensitivity to peanuts, shellfish, soy, latex or adhesives.  ROS: Admits to glasses, occasional leakage of urine with laughing, shortness of breath with asthma, diarrhea due to Metformin, knee/ankly/hand swelling on occasion but  denies headache, vision changes, nasal congestion, dysphagia, tinnitus, dizziness, hoarseness, cough,  chest pain,  nausea, vomiting, constipation,  urinary frequency, urgency  dysuria, hematuria, vaginitis symptoms, pelvic pain, swelling of joints, easy bruising,  myalgias, arthralgias, skin rashes, unexplained weight loss and except as is mentioned in the history of present illness, patient's review of systems is otherwise negative.  Physical Exam  Bp: 108/52   P: 72   R: 16   Temperature: 98.5 degrees F orally;  Weight: 174 lbs.   Height: 5\' 1"    BMI: 34  Neck: supple without masses or thyromegaly Lungs: clear to auscultation Heart: regular rate and rhythm with 1/6 systolic murmur Abdomen: soft, non-tender and no organomegaly Pelvic:EGBUS- atrophic vagina-atrophic ; uterus-normal size, cervix without lesions or motion tenderness; adnexae-no tenderness or masses Extremities:  no clubbing, cyanosis or edema   Assesment: Post Menopausal Bleeding              Disposition:  A discussion was held with patient regarding the indication for her procedure(s) along with the risks, which include but are not limited to:  reaction to anesthesia, damage to adjacent organs, infection and excessive bleeding. The patient verbalized understanding of these risks and has consented to proceed with Hysteroscopy, Dilatation, Curettage with Possible Endometrial Resection at  [facility]  on 04/08/14.    CSN# [id number]   Kenniel Bergsma J. Florene Glen, PA-C  for Dr. Franklyn Lor. Dillard

## 2014-04-08 NOTE — Discharge Instructions (Signed)
Hysteroscopy, Care After  Refer to this sheet in the next few weeks. These instructions provide you with information on caring for yourself after your procedure. Your health care provider may also give you more specific instructions. Your treatment has been planned according to current medical practices, but problems sometimes occur. Call your health care provider if you have any problems or questions after your procedure.   WHAT TO EXPECT AFTER THE PROCEDURE  After your procedure, it is typical to have the following:  · You may have some cramping. This normally lasts for a couple days.  · You may have bleeding. This can vary from light spotting for a few days to menstrual-like bleeding for 3 7 days.  HOME CARE INSTRUCTIONS  · Rest for the first 1 2 days after the procedure.  · Only take over-the-counter or prescription medicines as directed by your health care provider. Do not take aspirin. It can increase the chances of bleeding.  · Take showers instead of baths for 2 weeks or as directed by your health care provider.  · Do not drive for 24 hours or as directed.  · Do not drink alcohol while taking pain medicine.  · Do not use tampons, douche, or have sexual intercourse for 2 weeks or until your health care provider says it is okay.  · Take your temperature twice a day for 4 5 days. Write it down each time.  · Follow your health care provider's advice about diet, exercise, and lifting.  · If you develop constipation, you may:  · Take a mild laxative if your health care provider approves.  · Add bran foods to your diet.  · Drink enough fluids to keep your urine clear or pale yellow.  · Try to have someone with you or available to you for the first 24 48 hours, especially if you were given a general anesthetic.  · Follow up with your health care provider as directed.  SEEK MEDICAL CARE IF:  · You feel dizzy or lightheaded.  · You feel sick to your stomach (nauseous).  · You have abnormal vaginal discharge.  · You  have a rash.  · You have pain that is not controlled with medicine.  SEEK IMMEDIATE MEDICAL CARE IF:  · You have bleeding that is heavier than a normal menstrual period.  · You have a fever.  · You have increasing cramps or pain, not controlled with medicine.  · You have new belly (abdominal) pain.  · You pass out.  · You have pain in the tops of your shoulders (shoulder strap areas).  · You have shortness of breath.  Document Released: 08/18/2013 Document Reviewed: 05/27/2013  [employer]® Patient Information ©2014 [employer].

## 2014-04-08 NOTE — Op Note (Signed)
Pre op DX: Post Menopausal Bleeding, Abdominal pain, Fibromyalgia   Post Op ZO:XWRU with atrophic vaginits   PHYSICIAN : Hasna Stefanik   ASSISTANTS: none   ANESTHESIA:   General with LMA and paracervical block  ESTIMATED BLOOD LOSS: minimal  LOCAL MEDICATIONS USED:  LIDOCAINE 20CC  SPECIMEN:  Source of Specimen:  endometrial curettings  DISPOSITION OF SPECIMEN:  PATHOLOGY  COUNTS Correct:  YES    DICTATION #: The patient was taken to the operating room and prepped and draped in a normal sterile fashion. An in out catheter was used to drain the bladder.   A bivalve speculum was placed into the vagina and anterior lip of the cervix was grasped with a single-tooth tenaculum.  20 cc of 1% lidocaine was used for cervical block.  the cervix was then dilated with Kennon Rounds dilators up to 21. The hysteroscope was placed into the uterine cavity. The  entire uterus and both ostia were visualized.   Hyseroscope was then removed from the uterus. A sharp curettage was then done with a curette and endometrial curettings were obtained. The endometrial curettings were sent to pathology. Again the hysteroscope was placed into the uterine cavity. Both ostia were again visualized there were no polyps or submucosal fibroids or endometrial masses seen. The tenaculum was removed from the cervix and hemostasis was noted.   PLAN OF CARE: discharge to home  PATIENT DISPOSITION:  PACU - hemodynamically stable.

## 2014-04-08 NOTE — Interval H&P Note (Signed)
History and Physical Interval Note:  04/08/2014 9:13 AM  Pam [name]  has presented today for surgery, with the diagnosis of Post Menopausal Bleeding, Abdominal pain, Fibromyalgia  The various methods of treatment have been discussed with the patient and family. After consideration of risks, benefits and other options for treatment, the patient has consented to  Procedure(s): [place] (N/A) as a surgical intervention .  The patient's history has been reviewed, patient examined, no change in status, stable for surgery.  I have reviewed the patient's chart and labs.  Questions were answered to the patient's satisfaction.     [name] A [name]

## 2014-04-08 NOTE — Anesthesia Postprocedure Evaluation (Signed)
Anesthesia Post Note  Patient: Engineer, site  Procedure(s) Performed: Procedure(s) (LRB): DILATATION & CURETTAGE/HYSTEROSCOPY WITH POSSIBLE RESECTOCOPE (N/A)  Anesthesia type: General  Patient location: PACU  Post pain: Pain level controlled  Post assessment: Post-op Vital signs reviewed  Last Vitals:  Filed Vitals:   04/08/14 1100  BP: 113/61  Pulse: 70  Temp: 37.1 C  Resp: 14    Post vital signs: Reviewed  Level of consciousness: sedated  Complications: No apparent anesthesia complications

## 2014-04-08 NOTE — Anesthesia Preprocedure Evaluation (Addendum)
Anesthesia Evaluation  Patient identified by MRN, date of birth, ID band Patient awake    Reviewed: Allergy & Precautions, H&P , NPO status , Patient's Chart, lab work & pertinent test results  Airway Mallampati: I TM Distance: >3 FB Neck ROM: full    Dental no notable dental hx. (+) Teeth Intact   Pulmonary former smoker,          Cardiovascular negative cardio ROS      Neuro/Psych    GI/Hepatic   Endo/Other  diabetes, Well Controlled, Type 2  Renal/GU negative Renal ROS     Musculoskeletal   Abdominal Normal abdominal exam  (+)   Peds  Hematology negative hematology ROS (+)   Anesthesia Other Findings   Reproductive/Obstetrics negative OB ROS                          Anesthesia Physical Anesthesia Plan  ASA: II  Anesthesia Plan: General   Post-op Pain Management:    Induction: Intravenous  Airway Management Planned: LMA  Additional Equipment:   Intra-op Plan:   Post-operative Plan:   Informed Consent: I have reviewed the patients History and Physical, chart, labs and discussed the procedure including the risks, benefits and alternatives for the proposed anesthesia with the patient or authorized representative who has indicated his/her understanding and acceptance.     Plan Discussed with: CRNA and Surgeon  Anesthesia Plan Comments:         Anesthesia Quick Evaluation

## 2014-04-08 NOTE — Transfer of Care (Signed)
Immediate Anesthesia Transfer of Care Note  Patient: Engineer, site  Procedure(s) Performed: Procedure(s): DILATATION & CURETTAGE/HYSTEROSCOPY WITH POSSIBLE RESECTOCOPE (N/A)  Patient Location: PACU  Anesthesia Type:General  Level of Consciousness: awake, alert  and oriented  Airway & Oxygen Therapy: Patient Spontanous Breathing and Patient connected to nasal cannula oxygen  Post-op Assessment: Report given to PACU RN and Post -op Vital signs reviewed and stable  Post vital signs: Reviewed and stable  Complications: No apparent anesthesia complications

## 2014-04-11 ENCOUNTER — Encounter (HOSPITAL_COMMUNITY): Payer: Self-pay | Admitting: Obstetrics and Gynecology

## 2014-07-07 ENCOUNTER — Emergency Department (HOSPITAL_COMMUNITY): Payer: BC Managed Care – PPO

## 2014-07-07 ENCOUNTER — Encounter (HOSPITAL_COMMUNITY): Payer: Self-pay | Admitting: Emergency Medicine

## 2014-07-07 ENCOUNTER — Emergency Department (HOSPITAL_COMMUNITY)
Admission: EM | Admit: 2014-07-07 | Discharge: 2014-07-07 | Disposition: A | Discharge status: Home / Self Care | Payer: BC Managed Care – PPO | Attending: Emergency Medicine | Admitting: Emergency Medicine

## 2014-07-07 DIAGNOSIS — Z791 Long term (current) use of non-steroidal anti-inflammatories (NSAID): Secondary | ICD-10-CM | POA: Diagnosis not present

## 2014-07-07 DIAGNOSIS — M2550 Pain in unspecified joint: Secondary | ICD-10-CM

## 2014-07-07 DIAGNOSIS — R509 Fever, unspecified: Secondary | ICD-10-CM | POA: Diagnosis not present

## 2014-07-07 DIAGNOSIS — F411 Generalized anxiety disorder: Secondary | ICD-10-CM | POA: Insufficient documentation

## 2014-07-07 DIAGNOSIS — Z880 Allergy status to penicillin: Secondary | ICD-10-CM | POA: Diagnosis not present

## 2014-07-07 DIAGNOSIS — M129 Arthropathy, unspecified: Secondary | ICD-10-CM | POA: Insufficient documentation

## 2014-07-07 DIAGNOSIS — F3289 Other specified depressive episodes: Secondary | ICD-10-CM | POA: Diagnosis not present

## 2014-07-07 DIAGNOSIS — Z862 Personal history of diseases of the blood and blood-forming organs and certain disorders involving the immune mechanism: Secondary | ICD-10-CM | POA: Insufficient documentation

## 2014-07-07 DIAGNOSIS — F329 Major depressive disorder, single episode, unspecified: Secondary | ICD-10-CM | POA: Insufficient documentation

## 2014-07-07 DIAGNOSIS — R011 Cardiac murmur, unspecified: Secondary | ICD-10-CM | POA: Insufficient documentation

## 2014-07-07 DIAGNOSIS — E119 Type 2 diabetes mellitus without complications: Secondary | ICD-10-CM | POA: Diagnosis not present

## 2014-07-07 DIAGNOSIS — J45909 Unspecified asthma, uncomplicated: Secondary | ICD-10-CM | POA: Diagnosis not present

## 2014-07-07 DIAGNOSIS — R21 Rash and other nonspecific skin eruption: Secondary | ICD-10-CM | POA: Insufficient documentation

## 2014-07-07 DIAGNOSIS — R079 Chest pain, unspecified: Secondary | ICD-10-CM | POA: Insufficient documentation

## 2014-07-07 DIAGNOSIS — K5900 Constipation, unspecified: Secondary | ICD-10-CM | POA: Diagnosis present

## 2014-07-07 DIAGNOSIS — M353 Polymyalgia rheumatica: Secondary | ICD-10-CM | POA: Insufficient documentation

## 2014-07-07 DIAGNOSIS — K5909 Other constipation: Secondary | ICD-10-CM | POA: Insufficient documentation

## 2014-07-07 DIAGNOSIS — M549 Dorsalgia, unspecified: Secondary | ICD-10-CM | POA: Diagnosis not present

## 2014-07-07 DIAGNOSIS — Z8619 Personal history of other infectious and parasitic diseases: Secondary | ICD-10-CM | POA: Insufficient documentation

## 2014-07-07 DIAGNOSIS — Z79899 Other long term (current) drug therapy: Secondary | ICD-10-CM | POA: Diagnosis not present

## 2014-07-07 DIAGNOSIS — IMO0002 Reserved for concepts with insufficient information to code with codable children: Secondary | ICD-10-CM | POA: Insufficient documentation

## 2014-07-07 DIAGNOSIS — R000 Tachycardia, unspecified: Secondary | ICD-10-CM | POA: Diagnosis not present

## 2014-07-07 DIAGNOSIS — R0602 Shortness of breath: Secondary | ICD-10-CM | POA: Insufficient documentation

## 2014-07-07 LAB — COMPREHENSIVE METABOLIC PANEL
ALT: 25 U/L (ref 0–35)
AST: 23 U/L (ref 0–37)
Albumin: 3.8 g/dL (ref 3.5–5.2)
Alkaline Phosphatase: 85 U/L (ref 39–117)
Anion gap: 16 — ABNORMAL HIGH (ref 5–15)
BUN: 15 mg/dL (ref 6–23)
CALCIUM: 10.2 mg/dL (ref 8.4–10.5)
CO2: 23 mEq/L (ref 19–32)
Chloride: 98 mEq/L (ref 96–112)
Creatinine, Ser: 0.59 mg/dL (ref 0.50–1.10)
GFR calc non Af Amer: >90 mL/min (ref >90)
GLUCOSE: 119 mg/dL — AB (ref 70–99)
Potassium: 4.2 mEq/L (ref 3.7–5.3)
Sodium: 137 mEq/L (ref 137–147)
TOTAL PROTEIN: 8.4 g/dL — AB (ref 6.0–8.3)
Total Bilirubin: 0.2 mg/dL — ABNORMAL LOW (ref 0.3–1.2)

## 2014-07-07 LAB — URINALYSIS, ROUTINE W REFLEX MICROSCOPIC
Glucose, UA: NEGATIVE mg/dL
Ketones, ur: 15 mg/dL — AB
Leukocytes, UA: NEGATIVE
Nitrite: NEGATIVE
PROTEIN: NEGATIVE mg/dL
Specific Gravity, Urine: 1.031 — ABNORMAL HIGH (ref 1.005–1.030)
UROBILINOGEN UA: 0.2 mg/dL (ref 0.0–1.0)
pH: 5.5 (ref 5.0–8.0)

## 2014-07-07 LAB — I-STAT CHEM 8, ED
BUN: 16 mg/dL (ref 6–23)
CHLORIDE: 102 meq/L (ref 96–112)
Calcium, Ion: 1.12 mmol/L — ABNORMAL LOW (ref 1.13–1.30)
Creatinine, Ser: 0.7 mg/dL (ref 0.50–1.10)
Glucose, Bld: 109 mg/dL — ABNORMAL HIGH (ref 70–99)
HCT: 41 % (ref 36.0–46.0)
Hemoglobin: 13.9 g/dL (ref 12.0–15.0)
POTASSIUM: 3.9 meq/L (ref 3.7–5.3)
Sodium: 137 mEq/L (ref 137–147)
TCO2: 25 mmol/L (ref 0–100)

## 2014-07-07 LAB — CBC WITH DIFFERENTIAL/PLATELET
Basophils Absolute: 0 10*3/uL (ref 0.0–0.1)
Basophils Relative: 0 % (ref 0–1)
EOS ABS: 0.1 10*3/uL (ref 0.0–0.7)
EOS PCT: 1 % (ref 0–5)
HCT: 38.2 % (ref 36.0–46.0)
HEMOGLOBIN: 12.7 g/dL (ref 12.0–15.0)
LYMPHS ABS: 1.9 10*3/uL (ref 0.7–4.0)
Lymphocytes Relative: 23 % (ref 12–46)
MCH: 25.3 pg — AB (ref 26.0–34.0)
MCHC: 33.2 g/dL (ref 30.0–36.0)
MCV: 76.1 fL — ABNORMAL LOW (ref 78.0–100.0)
MONOS PCT: 6 % (ref 3–12)
Monocytes Absolute: 0.5 10*3/uL (ref 0.1–1.0)
Neutro Abs: 5.6 10*3/uL (ref 1.7–7.7)
Neutrophils Relative %: 70 % (ref 43–77)
Platelets: 364 10*3/uL (ref 150–400)
RBC: 5.02 MIL/uL (ref 3.87–5.11)
RDW: 14.2 % (ref 11.5–15.5)
WBC: 8 10*3/uL (ref 4.0–10.5)

## 2014-07-07 LAB — TROPONIN I

## 2014-07-07 LAB — URINE MICROSCOPIC-ADD ON

## 2014-07-07 LAB — SEDIMENTATION RATE: Sed Rate: 45 mm/hr — ABNORMAL HIGH (ref 0–22)

## 2014-07-07 LAB — PRO B NATRIURETIC PEPTIDE: Pro B Natriuretic peptide (BNP): 10.8 pg/mL (ref 0–125)

## 2014-07-07 LAB — I-STAT CG4 LACTIC ACID, ED: Lactic Acid, Venous: 0.81 mmol/L (ref 0.5–2.2)

## 2014-07-07 LAB — D-DIMER, QUANTITATIVE (NOT AT ARMC): D DIMER QUANT: 1.37 ug{FEU}/mL — AB (ref 0.00–0.48)

## 2014-07-07 LAB — LIPASE, BLOOD: Lipase: 35 U/L (ref 11–59)

## 2014-07-07 LAB — CK: Total CK: 36 U/L (ref 7–177)

## 2014-07-07 MED ORDER — SODIUM CHLORIDE 0.9 % IV BOLUS (SEPSIS)
500.0000 mL | Freq: Once | INTRAVENOUS | Status: AC
  Administered 2014-07-07: 500 mL via INTRAVENOUS

## 2014-07-07 MED ORDER — IOHEXOL 350 MG/ML SOLN
100.0000 mL | Freq: Once | INTRAVENOUS | Status: AC | PRN
  Administered 2014-07-07: 100 mL via INTRAVENOUS

## 2014-07-07 MED ORDER — PREDNISONE 20 MG PO TABS: 20.0000 mg | ORAL_TABLET | Freq: Every day | ORAL | Status: DC

## 2014-07-07 MED ORDER — LACTULOSE 10 GM/15ML PO SOLN: 30.0000 g | Freq: Every day | ORAL | Status: DC | PRN

## 2014-07-07 MED ORDER — METHYLPREDNISOLONE SODIUM SUCC 125 MG IJ SOLR
80.0000 mg | Freq: Once | INTRAMUSCULAR | Status: AC
  Administered 2014-07-07: 80 mg via INTRAVENOUS
  Filled 2014-07-07: qty 2

## 2014-07-07 MED ORDER — MAGNESIUM CITRATE PO SOLN: 1.0000 | Freq: Once | ORAL | Status: DC

## 2014-07-07 NOTE — Discharge Instructions (Signed)
Schedule followup with your rheumatologist within the next week. You need to be seen soon, as you cannot suddenly stop the prednisone we have prescribed. If you cannot see your rheumatologist, please see your primary doctor.  Return to the ER if you have worsening symptoms, especially fever.  Arthralgia Your caregiver has diagnosed you as suffering from an arthralgia. Arthralgia means there is pain in a joint. This can come from many reasons including:  Bruising the joint which causes soreness (inflammation) in the joint.  Wear and tear on the joints which occur as we grow older (osteoarthritis).  Overusing the joint.  Various forms of arthritis.  Infections of the joint. Regardless of the cause of pain in your joint, most of these different pains respond to anti-inflammatory drugs and rest. The exception to this is when a joint is infected, and these cases are treated with antibiotics, if it is a bacterial infection. HOME CARE INSTRUCTIONS   Rest the injured area for as long as directed by your caregiver. Then slowly start using the joint as directed by your caregiver and as the pain allows. Crutches as directed may be useful if the ankles, knees or hips are involved. If the knee was splinted or casted, continue use and care as directed. If an stretchy or elastic wrapping bandage has been applied today, it should be removed and re-applied every 3 to 4 hours. It should not be applied tightly, but firmly enough to keep swelling down. Watch toes and feet for swelling, bluish discoloration, coldness, numbness or excessive pain. If any of these problems (symptoms) occur, remove the ace bandage and re-apply more loosely. If these symptoms persist, contact your caregiver or return to this location.  For the first 24 hours, keep the injured extremity elevated on pillows while lying down.  Apply ice for 15-20 minutes to the sore joint every couple hours while awake for the first half day. Then 03-04  times per day for the first 48 hours. Put the ice in a plastic bag and place a towel between the bag of ice and your skin.  Wear any splinting, casting, elastic bandage applications, or slings as instructed.  Only take over-the-counter or prescription medicines for pain, discomfort, or fever as directed by your caregiver. Do not use aspirin immediately after the injury unless instructed by your physician. Aspirin can cause increased bleeding and bruising of the tissues.  If you were given crutches, continue to use them as instructed and do not resume weight bearing on the sore joint until instructed. Persistent pain and inability to use the sore joint as directed for more than 2 to 3 days are warning signs indicating that you should see a caregiver for a follow-up visit as soon as possible. Initially, a hairline fracture (break in bone) may not be evident on X-rays. Persistent pain and swelling indicate that further evaluation, non-weight bearing or use of the joint (use of crutches or slings as instructed), or further X-rays are indicated. X-rays may sometimes not show a small fracture until a week or 10 days later. Make a follow-up appointment with your own caregiver or one to whom we have referred you. A radiologist (specialist in reading X-rays) may read your X-rays. Make sure you know how you are to obtain your X-ray results. Do not assume everything is normal if you do not hear from [place]. SEEK MEDICAL CARE IF: Bruising, swelling, or pain increases. SEEK IMMEDIATE MEDICAL CARE IF:   Your fingers or toes are numb or blue.  The pain is not responding to medications and continues to stay the same or get worse.  The pain in your joint becomes severe.  You develop a fever over 102 F (38.9 C).  It becomes impossible to move or use the joint. MAKE SURE YOU:   Understand these instructions.  Will watch your condition.  Will get help right away if you are not doing well or get worse. Document  Released: 10/28/2005 Document Revised: 01/20/2012 Document Reviewed: 06/15/2008 [facility] Patient Information 2015 [place], [place]. This information is not intended to replace advice given to you by your health care provider. Make sure you discuss any questions you have with your health care provider.  Polymyalgia Rheumatica Polymyalgia rheumatica (also called PMR or polymyalgia) is a rheumatologic (arthritic) condition that causes pain and morning stiffness in your neck, shoulders, and hips. It is an inflammatory condition. In some people, inflammation of certain structures in the shoulder, hips, or other joints can be seen on special testing. It does not cause joint destruction, as occurs in other arthritic conditions. It usually occurs after 64 years of age, and is more common as you age. It can be confused with several other diseases, but it is usually easily treated. People with PMR often have, or can develop, a more severe rheumatologic condition called giant cell arteritis (also called CGA or temporal arteritis).  CAUSES  The exact cause of PMR is not known.   There are genetic factors involved.  Viruses have been suspected in the cause of PMR. This has not been proven. SYMPTOMS   Aching, pain, and morning stiffness your neck, both shoulders, or both hips.  Symptoms usually start slowly and build gradually.  Morning stiffness usually lasts at least 30 minutes.  Swelling and tenderness in other joints of the arms, hands, legs, and feet may occur.  Swelling and inflammation in the wrists can cause nerve inflammation at the wrist (carpal tunnel syndrome).  You may also have low grade fever, fatigue, weakness, decreased appetite and weight loss. DIAGNOSIS   Your caregiver may suspect that you have PMR based on your description of your symptoms and on your exam.  Your caregiver will examine you to be sure you do not have diseases that can be confused with PMR. These diseases include  rheumatoid arthritis, fibromyalgia, or thyroid disease.  Your caregiver should check for signs of giant cell arteritis. This can cause serious complications such as blindness.  Lab tests can help confirm that you have PMR and not other diseases, but are sometimes inconclusive.  X-rays cannot show PMR. However, it can identify other diseases like rheumatoid arthritis. Your caregiver may have you see a specialist in arthritis and inflammatory diseases (rheumatologist). TREATMENT  The goal of treatment is relief of symptoms. Treatment does not shorten the course of the illness or prevent complications. With proper treatment, you usually feel better almost right away.   The initial treatment of PMR is usually a cortisone (steroid) medication. Your caregiver will help determine a starting dose. The dose is gradually reduced every few weeks to months. Treatment usually lasts one to three years.  Other stronger medications are rarely needed. They will only be prescribed if your symptoms do not get better on cortisone medication alone, or if they recur as the dose is reduced.  Cortisone medication can have different side effects. With the doses of cortisone needed for PMR, the side effects can affect bones and joints, blood sugar control in diabetes, and mood changes. Discuss this with  your caregiver.  Your caregiver will evaluate you regularly during your treatment. They will do this in order to assess progress and to check for complications of the illness or treatment.  Physical therapy is sometimes useful. This is especially true if your joints are still stiff after other symptoms have improved. HOME CARE INSTRUCTIONS   Follow your caregiver's instructions. Do not change your dose of cortisone medication on your own.  Keep your appointments for follow-up lab tests and caregiver visits. Your lab tests need to be monitored. You must get checked periodically for giant cell arteritis.  Follow your  caregiver's guidance regarding physical activity (usually no restrictions are needed) or physical therapy.  Your caregiver may have instructions to prevent or check for side effects from cortisone medication (including bone density testing or treatment). Follow their instructions carefully. SEEK MEDICAL CARE IF:   You develop any side effects from treatment. Side effects can include:  Elevated blood pressure.  High blood sugar (or worsening of diabetes, if you are diabetic).  Difficulty fighting off infections.  Weight gain.  Weakness of the bones (osteoporosis).  Your aches, pains, morning stiffness, or other symptoms get worse with time. This is especially true after your dose of cortisone is reduced.  You develop new joint symptoms (pain, swelling, etc.) SEEK IMMEDIATE MEDICAL CARE IF:   You develop a severe headache.  You start vomiting.  You have problems with your vision.  You have an oral temperature above 102 F (38.9 C), not controlled by medicine. Document Released: 12/05/2004 Document Revised: 10/14/2012 Document Reviewed: 03/20/2009 [facility] Patient Information 2015 [place], [place]. This information is not intended to replace advice given to you by your health care provider. Make sure you discuss any questions you have with your health care provider.  Constipation Constipation is when a person has fewer than three bowel movements a week, has difficulty having a bowel movement, or has stools that are dry, hard, or larger than normal. As people grow older, constipation is more common. If you try to fix constipation with medicines that make you have a bowel movement (laxatives), the problem may get worse. Long-term laxative use may cause the muscles of the colon to become weak. A low-fiber diet, not taking in enough fluids, and taking certain medicines may make constipation worse.  CAUSES   Certain medicines, such as antidepressants, pain medicine, iron supplements,  antacids, and water pills.   Certain diseases, such as diabetes, irritable bowel syndrome (IBS), thyroid disease, or depression.   Not drinking enough water.   Not eating enough fiber-rich foods.   Stress or travel.   Lack of physical activity or exercise.   Ignoring the urge to have a bowel movement.   Using laxatives too much.  SIGNS AND SYMPTOMS   Having fewer than three bowel movements a week.   Straining to have a bowel movement.   Having stools that are hard, dry, or larger than normal.   Feeling full or bloated.   Pain in the lower abdomen.   Not feeling relief after having a bowel movement.  DIAGNOSIS  Your health care provider will take a medical history and perform a physical exam. Further testing may be done for severe constipation. Some tests may include:  A barium enema X-ray to examine your rectum, colon, and, sometimes, your small intestine.   A sigmoidoscopy to examine your lower colon.   A colonoscopy to examine your entire colon. TREATMENT  Treatment will depend on the severity of your constipation and  what is causing it. Some dietary treatments include drinking more fluids and eating more fiber-rich foods. Lifestyle treatments may include regular exercise. If these diet and lifestyle recommendations do not help, your health care provider may recommend taking over-the-counter laxative medicines to help you have bowel movements. Prescription medicines may be prescribed if over-the-counter medicines do not work.  HOME CARE INSTRUCTIONS   Eat foods that have a lot of fiber, such as fruits, vegetables, whole grains, and beans.  Limit foods high in fat and processed sugars, such as french fries, hamburgers, cookies, candies, and soda.   A fiber supplement may be added to your diet if you cannot get enough fiber from foods.   Drink enough fluids to keep your urine clear or pale yellow.   Exercise regularly or as directed by your health  care provider.   Go to the restroom when you have the urge to go. Do not hold it.   Only take over-the-counter or prescription medicines as directed by your health care provider. Do not take other medicines for constipation without talking to your health care provider first.  [place] IF:   You have bright red blood in your stool.   Your constipation lasts for more than 4 days or gets worse.   You have abdominal or rectal pain.   You have thin, pencil-like stools.   You have unexplained weight loss. MAKE SURE YOU:   Understand these instructions.  Will watch your condition.  Will get help right away if you are not doing well or get worse. Document Released: 07/26/2004 Document Revised: 11/02/2013 Document Reviewed: 08/09/2013 [facility] Patient Information 2015 [place], [place]. This information is not intended to replace advice given to you by your health care provider. Make sure you discuss any questions you have with your health care provider.
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
## 2014-07-07 NOTE — ED Notes (Signed)
Pt reports pain allover her body since Saturday. Monday evening went to [facility] clinic. They drew blood, pt states she hasn't heard back yet on results. Pt reports today she came in because she was feeling weak and SOB. Pt currently awake, alert, resp, even, unlabored at present. States low grade fever. Pt reports nausea, no vomiting or diarrhea. Last BM 2 weeks ago.

## 2014-07-07 NOTE — ED Notes (Signed)
Pt comfortable with discharge and follow up instructions. Prescriptions x3. 

## 2014-07-13 LAB — CULTURE, BLOOD (ROUTINE X 2)
Culture: NO GROWTH
Culture: NO GROWTH

## 2014-07-29 ENCOUNTER — Other Ambulatory Visit: Payer: Self-pay | Admitting: Neurology

## 2014-07-29 ENCOUNTER — Ambulatory Visit
Admission: RE | Admit: 2014-07-29 | Discharge: 2014-07-29 | Disposition: A | Discharge status: Home / Self Care | Payer: BC Managed Care – PPO | Source: Ambulatory Visit | Attending: Neurology | Admitting: Neurology

## 2014-07-29 DIAGNOSIS — M542 Cervicalgia: Secondary | ICD-10-CM

## 2014-08-10 ENCOUNTER — Other Ambulatory Visit: Payer: Self-pay | Admitting: Neurology

## 2014-08-10 DIAGNOSIS — M545 Low back pain, unspecified: Secondary | ICD-10-CM

## 2014-08-19 ENCOUNTER — Ambulatory Visit
Admission: RE | Admit: 2014-08-19 | Discharge: 2014-08-19 | Disposition: A | Discharge status: Home / Self Care | Payer: BC Managed Care – PPO | Source: Ambulatory Visit | Attending: Neurology | Admitting: Neurology

## 2014-08-19 DIAGNOSIS — M545 Low back pain, unspecified: Secondary | ICD-10-CM

## 2015-04-02 ENCOUNTER — Other Ambulatory Visit: Payer: Self-pay | Admitting: Family Medicine

## 2015-04-18 ENCOUNTER — Other Ambulatory Visit: Payer: Self-pay | Admitting: Internal Medicine

## 2015-04-18 DIAGNOSIS — N644 Mastodynia: Secondary | ICD-10-CM

## 2015-04-20 ENCOUNTER — Other Ambulatory Visit: Payer: BC Managed Care – PPO

## 2015-05-11 ENCOUNTER — Encounter: Payer: Self-pay | Admitting: Neurology

## 2015-05-11 ENCOUNTER — Ambulatory Visit (INDEPENDENT_AMBULATORY_CARE_PROVIDER_SITE_OTHER): Payer: BC Managed Care – PPO | Admitting: Neurology

## 2015-05-11 VITALS — BP 122/68 | HR 62 | Resp 16 | Ht 65.0 in | Wt 171.0 lb

## 2015-05-11 DIAGNOSIS — R351 Nocturia: Secondary | ICD-10-CM | POA: Diagnosis not present

## 2015-05-11 DIAGNOSIS — R519 Headache, unspecified: Secondary | ICD-10-CM

## 2015-05-11 DIAGNOSIS — G4719 Other hypersomnia: Secondary | ICD-10-CM

## 2015-05-11 DIAGNOSIS — R51 Headache: Secondary | ICD-10-CM

## 2015-05-11 DIAGNOSIS — G4733 Obstructive sleep apnea (adult) (pediatric): Secondary | ICD-10-CM

## 2015-05-11 DIAGNOSIS — E663 Overweight: Secondary | ICD-10-CM | POA: Diagnosis not present
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
## 2015-05-11 NOTE — Patient Instructions (Signed)

## 2015-06-05 ENCOUNTER — Ambulatory Visit (INDEPENDENT_AMBULATORY_CARE_PROVIDER_SITE_OTHER): Payer: BC Managed Care – PPO | Admitting: Neurology

## 2015-06-05 DIAGNOSIS — G4720 Circadian rhythm sleep disorder, unspecified type: Secondary | ICD-10-CM

## 2015-06-05 DIAGNOSIS — G4733 Obstructive sleep apnea (adult) (pediatric): Secondary | ICD-10-CM

## 2015-06-05 DIAGNOSIS — G479 Sleep disorder, unspecified: Secondary | ICD-10-CM

## 2015-06-05 DIAGNOSIS — G4761 Periodic limb movement disorder: Secondary | ICD-10-CM

## 2015-06-05 NOTE — Sleep Study (Signed)
Please see the scanned sleep study interpretation located in the Procedure tab within the Chart Review section. 

## 2015-06-14 ENCOUNTER — Telehealth: Payer: Self-pay | Admitting: Neurology

## 2015-06-14 NOTE — Telephone Encounter (Signed)
Patient seen on 05/11/15, PSG on 06/05/15:  Please call and notify the patient that the recent sleep study did show overall mild obstructive sleep apnea and mild leg twitching in sleep. Please inform patient that I would like to go over the details of the study and potential treatment options during a follow up appointment. Arrange a followup appointment. Also, route or fax report to PCP and referring MD, if other than PCP.  Once you have spoken to patient, you can close this encounter.   Thanks,  [name], MD, PhD [facility])

## 2015-06-14 NOTE — Telephone Encounter (Signed)
Left message on cell to call back for sleep study results. I tried calling the house number but no answer and no vm.

## 2015-06-14 NOTE — Telephone Encounter (Signed)
Pam [name] returned your call about her sleep test results.  Please call patient

## 2015-06-15 NOTE — Telephone Encounter (Signed)
I spoke to Pam [name] and she is aware of results and we were able to schedule an appointment for her.

## 2015-06-15 NOTE — Telephone Encounter (Signed)
Duplicate message. 

## 2015-06-15 NOTE — Telephone Encounter (Signed)
Left message to call back  

## 2015-07-06 ENCOUNTER — Ambulatory Visit: Payer: Self-pay | Admitting: Neurology

## 2015-07-26 ENCOUNTER — Ambulatory Visit (INDEPENDENT_AMBULATORY_CARE_PROVIDER_SITE_OTHER): Payer: BC Managed Care – PPO | Admitting: Neurology

## 2015-07-26 ENCOUNTER — Encounter: Payer: Self-pay | Admitting: Neurology

## 2015-07-26 VITALS — BP 114/58 | HR 76 | Resp 16 | Ht 65.0 in | Wt 172.0 lb

## 2015-07-26 DIAGNOSIS — G4733 Obstructive sleep apnea (adult) (pediatric): Secondary | ICD-10-CM

## 2015-07-26 DIAGNOSIS — G4719 Other hypersomnia: Secondary | ICD-10-CM | POA: Diagnosis not present

## 2015-07-26 DIAGNOSIS — R351 Nocturia: Secondary | ICD-10-CM | POA: Diagnosis not present

## 2015-07-26 DIAGNOSIS — R519 Headache, unspecified: Secondary | ICD-10-CM

## 2015-07-26 DIAGNOSIS — E663 Overweight: Secondary | ICD-10-CM

## 2015-07-26 DIAGNOSIS — R51 Headache: Secondary | ICD-10-CM | POA: Diagnosis not present
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
## 2015-07-26 NOTE — Patient Instructions (Signed)
Your recent sleep study showed mild obstructive sleep apnea. However, you did not sleep very well and did not achieve deep sleep or dream sleep. OSA is overall mild, but worth treating to see if you feels better after treatment. To that end I recommend treatment for this in the form of autoPAP. I have placed an order and we will see you back in a couple of months.

## 2015-08-03 ENCOUNTER — Telehealth: Payer: Self-pay | Admitting: Neurology

## 2015-08-03 NOTE — Telephone Encounter (Signed)
Patient is calling because she heard from [employer] regarding the APAP machine and they stated she has not met her deductible and it will be expensive. The patient did see an APAP machine on line much cheaper and if the patient gives [place] information can she get a Rx for that machine . Please call the patient and advise. Thank you.

## 2015-08-07 NOTE — Telephone Encounter (Signed)
In speaking with Dr. Rexene Alberts, I will need to speak to Pam [name] in sleep lab to see what we can do. The problem with machines off line is we are unable to set machine up and we may not be able to get download. I spoke to [place], she is aware that we are looking into it. I will speak to [facility] tomorrow.

## 2015-08-09 NOTE — Telephone Encounter (Signed)
I spoke to Pam [name] and advised her that I spoke with Dr. Rexene Alberts and Shirlean Mylar from the sleep lab. Patient did confirm that the machines that she was looking for was an Auto-pap machine. I explained our concerns that there will be no one to set or change the pressure settings. DME companies will not work with a machine they did not provide and we are not licensed to set PAP pressures. The other concern is getting downloads. We only have ResMed and Respironics programs and the machine will need to be compatible.  Patient voiced understanding and states that she will do more research on the matter because she is unable to afford AutoPAP from [place] right now. [name] states that she will call us back with what she wants to do.

## 2015-09-28 ENCOUNTER — Telehealth: Payer: Self-pay

## 2015-09-28 NOTE — Telephone Encounter (Signed)
Left message about appt on Monday. This was for f/u with AutoPAP machine, but I do not think patient has started treatment. I left message stating that it is not neccisary for her to come in until she starts AutoPAP treatment. But she can still come in if needed.

## 2015-10-02 ENCOUNTER — Ambulatory Visit: Payer: BC Managed Care – PPO | Admitting: Neurology

## 2015-11-09 ENCOUNTER — Ambulatory Visit (INDEPENDENT_AMBULATORY_CARE_PROVIDER_SITE_OTHER): Payer: BC Managed Care – PPO | Admitting: Family Medicine

## 2015-11-09 ENCOUNTER — Encounter (INDEPENDENT_AMBULATORY_CARE_PROVIDER_SITE_OTHER): Payer: Self-pay

## 2015-11-09 VITALS — BP 115/66 | HR 110 | Temp 98.5°F | Resp 20

## 2015-11-09 DIAGNOSIS — J4520 Mild intermittent asthma, uncomplicated: Secondary | ICD-10-CM

## 2015-11-09 MED ORDER — METHYLPREDNISOLONE 4 MG PO TBPK
ORAL_TABLET | ORAL | Status: AC
Start: 2015-11-09 — End: ?

## 2015-11-09 MED ORDER — ALBUTEROL-IPRATROPIUM 2.5-0.5 (3) MG/3ML IN SOLN
3.0000 mL | Freq: Once | RESPIRATORY_TRACT | Status: AC
Start: 2015-11-09 — End: 2015-11-09
  Administered 2015-11-09: 3 mL via RESPIRATORY_TRACT

## 2015-11-09 MED ORDER — METHYLPREDNISOLONE ACETATE 40 MG/ML IJ SUSP
40.0000 mg | Freq: Once | INTRAMUSCULAR | Status: AC
Start: 2015-11-09 — End: 2015-11-09
  Administered 2015-11-09: 40 mg via INTRAMUSCULAR

## 2015-11-09 NOTE — Patient Instructions (Signed)
Controlling Asthma Triggers: Animals  Having allergies to animals can trigger asthma flare-ups. Yourhealth care provider may test you for allergies to several types of animals. Theallergies arecaused byan animal's dander (dry skin flakes), feathers, droppings, urine, and saliva. If you are allergic to pets, there are some things you can do to lessen your symptoms.    Keeping Your Home Clean   Dust often with a damp cloth to lessen pet dander.   Keep your pets off of your bed and out of your bedroom. You spend a big part of each day there sleeping.   Keep your pets off of upholstered furniture, like sofas and chairs, and out of rooms with carpeting.   Use a special bag for your vacuum or use a vacuum designed to lessen pet dander and hair on carpeting.   Think about buying a home air cleaner with a HEPA (high-efficiency particulate air) filter. They help to remove allergens in the air.  Away From Home   You may have to avoid homes with pets. Even if the pets are outdoors during a visit, the dander remains.   When spending the night somewhere, ask to sleep in a room where pets aren't allowed.  Choosing a Pet  If you are considering a new pet, there are some things you should know:   For people with allergies to dogs and cats, it is not the length of the fur or coat or the amount of shedding that matters. All dogs and cats have dander. There aren't dogs and cats that are completely allergen-free.   Fish and reptiles do not cause allergies.   2000-2015 [employer] [address], [place] [address]. All rights reserved. This information is not intended as a substitute for professional medical care. Always follow your healthcare professional's instructions.

## 2015-11-09 NOTE — Progress Notes (Signed)
[facility]  PROGRESS NOTE     Patient: Monica Fields   Date: 11/09/2015   MRN: [id number]       Naquita Nappier is a 64 y.o. female      SUBJECTIVE     Chief Complaint   Patient presents with   . Wheezing     asthmatic patient c/o allergies due to dog in house on set 11/07/15.         Wheezing   This is a new problem. The current episode started yesterday. The problem occurs constantly (dog sitting and is allergic ). Associated symptoms include coughing and rhinorrhea. Pertinent negatives include no chest pain, chills, coryza, ear pain, fever, sore throat, sputum production or swollen glands. The symptoms are aggravated by animal exposure. She has tried beta agonist inhalers for the symptoms. The treatment provided no relief. Her past medical history is significant for asthma.       Review of Systems   Constitutional: Negative for fever and chills.   HENT: Positive for rhinorrhea. Negative for ear pain, postnasal drip and sore throat.    Respiratory: Positive for cough and wheezing. Negative for sputum production.    Cardiovascular: Negative for chest pain.       The following portions of the patient's history were reviewed and updated as appropriate: Allergies, Current Medications, Past Family History, Past Medical history, Past social history, Past surgical history, and Problem List.    OBJECTIVE     Vitals   Filed Vitals:    11/09/15 0943 11/09/15 1022   BP: 115/66    Pulse: 81 110   Temp: 98.5 F (36.9 C)    TempSrc: Oral    Resp: 20    SpO2: 91% 97%       Physical Exam   Constitutional: She appears well-developed and well-nourished.   HENT:   Head: Normocephalic and atraumatic.   Right Ear: Tympanic membrane normal.   Left Ear: Tympanic membrane normal.   Mouth/Throat: Oropharynx is clear and moist.   Eyes: Conjunctivae are normal.   Neck: Neck supple.   Cardiovascular: Normal rate, regular rhythm and normal heart sounds.    Pulmonary/Chest: Effort normal. She has wheezes.   Improved after neb    Lymphadenopathy:     She has no cervical adenopathy.   Skin: Skin is warm and dry.       Lab Results (24 Hour)   Results     ** No results found for the last 24 hours. **          Radiology Results (24 Hour)     ** No results found for the last 24 hours. **          ASSESSMENT     Encounter Diagnosis   Name Primary?   . Mild intermittent asthma without complication Yes          PLAN     Procedures    1. Mild intermittent asthma without complication  - methylPREDNISolone acetate (DEPO-MEDROL) injection 40 mg; Inject 1 mL (40 mg total) into the muscle once.    - albuterol-ipratropium (DUO-NEB) 2.5-0.5(3) mg/3 mL nebulizer 3 mL; Take 3 mLs by nebulization one time.    - methylPREDNISolone (MEDROL DOSPACK) 4 MG tablet; Use as directed - follow package directions  Dispense: 21 tablet; Refill: 0  -Patient to follow up with primary care doctor or follow up here if symptoms worsening or not resolving as expected.   -Follow up with ED with acute worsening  of symptoms.  -Patient verbalized understanding.   -Reviewed discharge instructions that are included here with patient, and printed in AVS.   -Questions answered.      An After Visit Summary was printed and given to the patient.      Signed,  Britta Mccreedy, MD  11/09/2015

## 2016-01-31 ENCOUNTER — Other Ambulatory Visit (HOSPITAL_BASED_OUTPATIENT_CLINIC_OR_DEPARTMENT_OTHER): Payer: BC Managed Care – PPO

## 2016-01-31 ENCOUNTER — Encounter: Payer: Self-pay | Admitting: Hematology & Oncology

## 2016-01-31 ENCOUNTER — Ambulatory Visit (HOSPITAL_BASED_OUTPATIENT_CLINIC_OR_DEPARTMENT_OTHER): Payer: BC Managed Care – PPO | Admitting: Hematology & Oncology

## 2016-01-31 ENCOUNTER — Ambulatory Visit: Payer: BC Managed Care – PPO

## 2016-01-31 VITALS — BP 142/64 | HR 74 | Temp 98.3°F | Resp 18 | Ht 65.0 in | Wt 179.0 lb

## 2016-01-31 DIAGNOSIS — D509 Iron deficiency anemia, unspecified: Secondary | ICD-10-CM

## 2016-01-31 DIAGNOSIS — D582 Other hemoglobinopathies: Secondary | ICD-10-CM | POA: Diagnosis not present

## 2016-01-31 LAB — CBC WITH DIFFERENTIAL (CANCER CENTER ONLY)
BASO#: 0 10*3/uL (ref 0.0–0.2)
BASO%: 0.4 % (ref 0.0–2.0)
EOS%: 1.9 % (ref 0.0–7.0)
Eosinophils Absolute: 0.1 10*3/uL (ref 0.0–0.5)
HCT: 35.3 % (ref 34.8–46.6)
HEMOGLOBIN: 12.1 g/dL (ref 11.6–15.9)
LYMPH#: 2 10*3/uL (ref 0.9–3.3)
LYMPH%: 35.6 % (ref 14.0–48.0)
MCH: 25.6 pg — ABNORMAL LOW (ref 26.0–34.0)
MCHC: 34.3 g/dL (ref 32.0–36.0)
MCV: 75 fL — ABNORMAL LOW (ref 81–101)
MONO#: 0.5 10*3/uL (ref 0.1–0.9)
MONO%: 8.8 % (ref 0.0–13.0)
NEUT%: 53.3 % (ref 39.6–80.0)
NEUTROS ABS: 3 10*3/uL (ref 1.5–6.5)
Platelets: 364 10*3/uL (ref 145–400)
RBC: 4.72 10*6/uL (ref 3.70–5.32)
RDW: 14 % (ref 11.1–15.7)
WBC: 5.7 10*3/uL (ref 3.9–10.0)

## 2016-01-31 LAB — CHCC SATELLITE - SMEAR

## 2016-01-31 MED ORDER — FOLIC ACID 1 MG PO TABS: 2.0000 mg | ORAL_TABLET | Freq: Every day | ORAL | Status: DC
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
## 2016-02-01 LAB — FERRITIN: FERRITIN: 56 ng/mL (ref 9–269)

## 2016-02-01 LAB — IRON AND TIBC
%SAT: 17 % — ABNORMAL LOW (ref 21–57)
IRON: 47 ug/dL (ref 41–142)
TIBC: 281 ug/dL (ref 236–444)
UIBC: 234 ug/dL (ref 120–384)

## 2016-02-01 LAB — RETICULOCYTES: RETICULOCYTE COUNT: 1.3 % (ref 0.6–2.6)

## 2016-02-05 LAB — HEMOGLOBINOPATHY EVALUATION
HEMOGLOBIN F QUANTITATION: 0 % (ref 0.0–2.0)
HGB A: 65.3 % — AB (ref 94.0–98.0)
HGB C: 32.3 % — AB
HGB S: 0 %
Hemoglobin A2 Quantitation: 2.4 % (ref 0.7–3.1)

## 2016-02-08 LAB — ALPHA-THALASSEMIA GENOTYPR: PDF: 0

## 2016-06-20 ENCOUNTER — Other Ambulatory Visit: Payer: Self-pay | Admitting: Nurse Practitioner

## 2016-06-20 DIAGNOSIS — N644 Mastodynia: Secondary | ICD-10-CM

## 2016-06-26 ENCOUNTER — Ambulatory Visit
Admission: RE | Admit: 2016-06-26 | Discharge: 2016-06-26 | Disposition: A | Discharge status: Home / Self Care | Payer: Medicare Other | Source: Ambulatory Visit | Attending: Internal Medicine | Admitting: Internal Medicine

## 2016-06-26 DIAGNOSIS — N644 Mastodynia: Secondary | ICD-10-CM

## 2016-11-14 ENCOUNTER — Ambulatory Visit (INDEPENDENT_AMBULATORY_CARE_PROVIDER_SITE_OTHER): Payer: Medicare Other | Admitting: Physician Assistant

## 2016-11-14 ENCOUNTER — Ambulatory Visit (INDEPENDENT_AMBULATORY_CARE_PROVIDER_SITE_OTHER): Payer: Medicare Other

## 2016-11-14 VITALS — BP 138/66 | HR 112 | Temp 101.3°F | Resp 18 | Ht 61.5 in | Wt 180.0 lb

## 2016-11-14 DIAGNOSIS — R0989 Other specified symptoms and signs involving the circulatory and respiratory systems: Secondary | ICD-10-CM | POA: Diagnosis not present

## 2016-11-14 DIAGNOSIS — R42 Dizziness and giddiness: Secondary | ICD-10-CM

## 2016-11-14 DIAGNOSIS — R509 Fever, unspecified: Secondary | ICD-10-CM

## 2016-11-14 DIAGNOSIS — R911 Solitary pulmonary nodule: Secondary | ICD-10-CM

## 2016-11-14 DIAGNOSIS — R7309 Other abnormal glucose: Secondary | ICD-10-CM

## 2016-11-14 DIAGNOSIS — R718 Other abnormality of red blood cells: Secondary | ICD-10-CM

## 2016-11-14 LAB — POCT GLYCOSYLATED HEMOGLOBIN (HGB A1C): HEMOGLOBIN A1C: 7.9

## 2016-11-14 LAB — POCT CBC
Granulocyte percent: 65.2 %G (ref 37–80)
HCT, POC: 38.6 % (ref 37.7–47.9)
HEMOGLOBIN: 13.2 g/dL (ref 12.2–16.2)
LYMPH, POC: 2.1 (ref 0.6–3.4)
MCH, POC: 25.9 pg — AB (ref 27–31.2)
MCHC: 34.1 g/dL (ref 31.8–35.4)
MCV: 76 fL — AB (ref 80–97)
MID (cbc): 0.3 (ref 0–0.9)
MPV: 6.6 fL (ref 0–99.8)
PLATELET COUNT, POC: 300 10*3/uL (ref 142–424)
POC Granulocyte: 4.4 (ref 2–6.9)
POC LYMPH PERCENT: 30.6 %L (ref 10–50)
POC MID %: 4.2 % (ref 0–12)
RBC: 5.08 M/uL (ref 4.04–5.48)
RDW, POC: 14.1 %
WBC: 6.7 10*3/uL (ref 4.6–10.2)

## 2016-11-14 LAB — POCT INFLUENZA A/B
INFLUENZA A, POC: NEGATIVE
INFLUENZA B, POC: NEGATIVE

## 2016-11-14 LAB — POCT RAPID STREP A (OFFICE): RAPID STREP A SCREEN: NEGATIVE

## 2016-11-14 MED ORDER — IPRATROPIUM BROMIDE 0.02 % IN SOLN
0.5000 mg | Freq: Once | RESPIRATORY_TRACT | Status: AC
  Administered 2016-11-14: 0.5 mg via RESPIRATORY_TRACT

## 2016-11-14 MED ORDER — PREDNISONE 20 MG PO TABS: 40.0000 mg | ORAL_TABLET | Freq: Every day | ORAL | 0 refills | Status: DC

## 2016-11-14 MED ORDER — PREDNISONE 20 MG PO TABS: 20.0000 mg | ORAL_TABLET | Freq: Every day | ORAL | 0 refills | Status: DC

## 2016-11-14 MED ORDER — DOXYCYCLINE HYCLATE 100 MG PO CAPS: 100.0000 mg | ORAL_CAPSULE | Freq: Two times a day (BID) | ORAL | 0 refills | Status: AC

## 2016-11-14 MED ORDER — ALBUTEROL SULFATE (2.5 MG/3ML) 0.083% IN NEBU
2.5000 mg | INHALATION_SOLUTION | Freq: Once | RESPIRATORY_TRACT | Status: AC
  Administered 2016-11-14: 2.5 mg via RESPIRATORY_TRACT

## 2016-11-14 NOTE — Patient Instructions (Addendum)
Please check your sugar three times a day.  If greater than 300 then come to the clinic or go to the ED.    IF you received an x-ray today, you will receive an invoice from [facility]. Please contact [facility] at [phone] with questions or concerns regarding your invoice.   IF you received labwork today, you will receive an invoice from [facility]. Please contact [facility] at [phone] with questions or concerns regarding your invoice.   Our billing staff will not be able to assist you with questions regarding bills from these companies.  You will be contacted with the lab results as soon as they are available. The fastest way to get your results is to activate your My Chart account. Instructions are located on the last page of this paperwork. If you have not heard from [place] regarding the results in 2 weeks, please contact this office.

## 2016-11-14 NOTE — Progress Notes (Signed)
O 11/15/2016 5:07 PM   DOB: [birth year] / MRN: [id number]  SUBJECTIVE:  Pam [name] is a 66 y.o. female presenting for sore throat that started yesterday.  Associates cough, fever, myalgia, HA. Feels that she is getting some diarrhea.  History of diabetes with an A1C of 7.6 in spring of 2017.  Says that she has been sick since [place] Years, but reports sore throat that started yesterday "came on really fast." She associates malaise and fatigue.  Denies SOB, chest pain, palpitations.   She is allergic to amoxicillin.   She  has a past medical history of Anemia; Anxiety; Arthritis; Asthma; Chest pain (02/25/2012); Complication of anesthesia; Depression; Diabetes mellitus; Disorder of soft tissue; Fibromyalgia; GERD (gastroesophageal reflux disease); Headache(784.0); Heart murmur; Hepatitis; Hypercholesteremia; Hypertension; Insomnia; Noninfectious gastroenteritis; Obesity; Shortness of breath; Sleep apnea; Tuberculosis; and Vitamin D deficiency.    She  reports that she quit smoking about 19 years ago. She has a 10.00 pack-year smoking history. She has never used smokeless tobacco. She reports that she drinks about 0.6 oz of alcohol per week . She reports that she does not use drugs. She  reports that she does not engage in sexual activity. The patient  has a past surgical history that includes Appendectomy; Cholecystectomy; Cosmetic surgery; Dilation and curettage of uterus; Colonoscopy; and Dilatation & currettage/hysteroscopy with resectoscope (N/A, 04/08/2014).  Her family history includes Cervical cancer in her maternal grandmother; Dementia in her mother; Diabetes type II in her maternal grandmother, mother, and sister; Heart attack in her father; Hypertension in her mother; Stomach cancer in her maternal grandfather; Stroke in her mother.  Review of Systems  Constitutional: Positive for chills, fever and malaise/fatigue. Negative for diaphoresis and weight loss.  HENT: Positive for congestion, sinus  pain and sore throat.   Respiratory: Positive for cough and sputum production. Negative for hemoptysis, shortness of breath and wheezing.   Skin: Negative for rash.  Neurological: Negative for dizziness and weakness.    The problem list and medications were reviewed and updated by myself where necessary and exist elsewhere in the encounter.   OBJECTIVE:  BP 138/66 (BP Location: Right Arm, Patient Position: Sitting, Cuff Size: Normal)   Pulse (!) 112   Temp (!) 101.3 F (38.5 C) (Oral)   Resp 18   Ht 5' 1.5" (1.562 m)   Wt 180 lb (81.6 kg)   SpO2 95%   BMI 33.46 kg/m   Physical Exam  Constitutional: She is oriented to person, place, and time.  Cardiovascular: Normal rate, regular rhythm and normal heart sounds.   Pulmonary/Chest: Effort normal. No respiratory distress. She has no decreased breath sounds. She has no wheezes. She has rhonchi in the right lower field. She has no rales.  Musculoskeletal: Normal range of motion.  Neurological: She is alert and oriented to person, place, and time. No cranial nerve deficit.  Skin: Skin is warm and dry.  Psychiatric: She has a normal mood and affect.    Results for orders placed or performed in visit on 11/14/16 (from the past 72 hour(s))  POCT rapid strep A     Status: None   Collection Time: 11/14/16  3:53 PM  Result Value Ref Range   Rapid Strep A Screen Negative Negative  POCT Influenza A/B     Status: None   Collection Time: 11/14/16  4:04 PM  Result Value Ref Range   Influenza A, POC Negative Negative   Influenza B, POC Negative Negative  POCT CBC  Status: Abnormal   Collection Time: 11/14/16  4:10 PM  Result Value Ref Range   WBC 6.7 4.6 - 10.2 K/uL   Lymph, poc 2.1 0.6 - 3.4   POC LYMPH PERCENT 30.6 10 - 50 %L   MID (cbc) 0.3 0 - 0.9   POC MID % 4.2 0 - 12 %M   POC Granulocyte 4.4 2 - 6.9   Granulocyte percent 65.2 37 - 80 %G   RBC 5.08 4.04 - 5.48 M/uL   Hemoglobin 13.2 12.2 - 16.2 g/dL   HCT, POC 38.6 37.7 -  47.9 %   MCV 76.0 (A) 80 - 97 fL   MCH, POC 25.9 (A) 27 - 31.2 pg   MCHC 34.1 31.8 - 35.4 g/dL   RDW, POC 14.1 %   Platelet Count, POC 300 142 - 424 K/uL   MPV 6.6 0 - 99.8 fL  Ferritin     Status: None   Collection Time: 11/14/16  4:20 PM  Result Value Ref Range   Ferritin 95 15 - 150 ng/mL  POCT glycosylated hemoglobin (Hb A1C)     Status: None   Collection Time: 11/14/16  4:56 PM  Result Value Ref Range   Hemoglobin A1C 7.9    No results found. No data found.   ASSESSMENT AND PLAN  Edwardine was seen today for shortness of breath, dizziness and cough.  Diagnoses and all orders for this visit:  Fever, unspecified fever cause: Her work up is reassuring. I have not screened a urine given she is complaining of cough.  Starting doxy.   Low dose pred due to diabetic status (see avs for anticipatory guidance).  I doubt this problem has anything to do with problem 6.  Advised that she hydrate copiously. RTC in 48 hours for recheck. Given the duration of this illness I don't think tamiflu will help however doxy should treat an early staph pneumo.  -     POCT rapid strep A -     Culture, Group A Strep -     POCT Influenza A/B  Abnormal lung sounds: She has a history of asthma. The nebulizers helped. -     POCT CBC -     DG Chest 2 View; Future -     albuterol (PROVENTIL) (2.5 MG/3ML) 0.083% nebulizer solution 2.5 mg; Take 3 mLs (2.5 mg total) by nebulization once. -     ipratropium (ATROVENT) nebulizer solution 0.5 mg; Take 2.5 mLs (0.5 mg total) by nebulization once.  Dizziness -     Orthostatic vital signs  Microcytosis -     Ferritin  Elevated hemoglobin A1c -     POCT glycosylated hemoglobin (Hb A1C)  Incidental lung nodule, > 15mm and < 40mm -     CT CHEST W CONTRAST; Future     The patient is advised to call or return to clinic if she does not see an improvement in symptoms, or to seek the care of the closest emergency department if she worsens with the above plan.    Philis Fendt, MHS, PA-C [facility] 11/15/2016 5:07 PM

## 2016-11-15 ENCOUNTER — Emergency Department (HOSPITAL_COMMUNITY)
Admission: EM | Admit: 2016-11-15 | Discharge: 2016-11-15 | Disposition: A | Discharge status: Home / Self Care | Payer: Medicare Other | Attending: Emergency Medicine | Admitting: Emergency Medicine

## 2016-11-15 ENCOUNTER — Encounter (HOSPITAL_COMMUNITY): Payer: Self-pay | Admitting: Emergency Medicine

## 2016-11-15 DIAGNOSIS — Z87891 Personal history of nicotine dependence: Secondary | ICD-10-CM | POA: Diagnosis not present

## 2016-11-15 DIAGNOSIS — I10 Essential (primary) hypertension: Secondary | ICD-10-CM | POA: Diagnosis not present

## 2016-11-15 DIAGNOSIS — J45909 Unspecified asthma, uncomplicated: Secondary | ICD-10-CM | POA: Insufficient documentation

## 2016-11-15 DIAGNOSIS — E1165 Type 2 diabetes mellitus with hyperglycemia: Secondary | ICD-10-CM | POA: Insufficient documentation

## 2016-11-15 DIAGNOSIS — R739 Hyperglycemia, unspecified: Secondary | ICD-10-CM

## 2016-11-15 LAB — I-STAT CHEM 8, ED
BUN: 10 mg/dL (ref 6–20)
Calcium, Ion: 1.2 mmol/L (ref 1.15–1.40)
Chloride: 103 mmol/L (ref 101–111)
Creatinine, Ser: 0.6 mg/dL (ref 0.44–1.00)
Glucose, Bld: 191 mg/dL — ABNORMAL HIGH (ref 65–99)
HEMATOCRIT: 37 % (ref 36.0–46.0)
Hemoglobin: 12.6 g/dL (ref 12.0–15.0)
POTASSIUM: 3.9 mmol/L (ref 3.5–5.1)
SODIUM: 137 mmol/L (ref 135–145)
TCO2: 25 mmol/L (ref 0–100)

## 2016-11-15 LAB — CBG MONITORING, ED: Glucose-Capillary: 218 mg/dL — ABNORMAL HIGH (ref 65–99)

## 2016-11-15 LAB — FERRITIN: Ferritin: 95 ng/mL (ref 15–150)

## 2016-11-15 NOTE — Discharge Instructions (Signed)

## 2016-11-15 NOTE — ED Triage Notes (Signed)
Pt was seen yesterday at urgent care for flu like symptoms and started on antibiotics and steroids for asthma. Patient also adds that her Hgb A1c was 7.1 so she was told that if her blood sugar was above 300 today that patient needed to go to the ED.  Patient states that CBG was 359 when checked this afternoon.
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
## 2016-11-15 NOTE — ED Notes (Signed)
ED Provider at bedside. 

## 2016-11-16 ENCOUNTER — Telehealth: Payer: Self-pay

## 2016-11-16 ENCOUNTER — Ambulatory Visit (INDEPENDENT_AMBULATORY_CARE_PROVIDER_SITE_OTHER): Payer: Medicare Other | Admitting: Physician Assistant

## 2016-11-16 ENCOUNTER — Encounter: Payer: Self-pay | Admitting: Physician Assistant

## 2016-11-16 VITALS — BP 122/58 | HR 89 | Temp 98.2°F | Resp 18 | Ht 61.0 in | Wt 177.6 lb

## 2016-11-16 DIAGNOSIS — R05 Cough: Secondary | ICD-10-CM

## 2016-11-16 DIAGNOSIS — R7309 Other abnormal glucose: Secondary | ICD-10-CM

## 2016-11-16 DIAGNOSIS — R059 Cough, unspecified: Secondary | ICD-10-CM

## 2016-11-16 MED ORDER — ALBUTEROL SULFATE (2.5 MG/3ML) 0.083% IN NEBU
2.5000 mg | INHALATION_SOLUTION | Freq: Once | RESPIRATORY_TRACT | Status: AC
  Administered 2016-11-16: 2.5 mg via RESPIRATORY_TRACT

## 2016-11-16 MED ORDER — IPRATROPIUM BROMIDE 0.02 % IN SOLN
0.5000 mg | Freq: Once | RESPIRATORY_TRACT | Status: AC
  Administered 2016-11-16: 0.5 mg via RESPIRATORY_TRACT

## 2016-11-16 MED ORDER — GLIPIZIDE 5 MG PO TABS: 2.5000 mg | ORAL_TABLET | Freq: Two times a day (BID) | ORAL | 3 refills | Status: DC | PRN

## 2016-11-16 NOTE — Telephone Encounter (Signed)
Pt is calling back stating that she did not get her note to be out of work til Thursday and would like to pick it up on Monday   Best number [phone]

## 2016-11-16 NOTE — Telephone Encounter (Signed)
Ok to write? 

## 2016-11-16 NOTE — Progress Notes (Signed)
11/16/2016 7:19 PM   DOB: 06/29/1951 / MRN: [id number]  SUBJECTIVE:  Pam [name] is a 66 y.o. female presenting for recheck of respiratory illness. She has a history of asthma. Last I saw her I was worried that she may be starting the have an asthma exacerbation and started her on pred for this. I also started her on doxy to cover for an atypical pneumonia given her symptoms. She had a fever at that time along with a mild tachycardia.  Today she denies fever, chills, and overall feels better.  She did have the seasonal flu shot and I did not test her as it would have made no difference in the plan given the symptome were already greater than 72 hours old.    She did have to go to the ED for elevated BS and was evaluated and her work up there was reassuring.   She is allergic to amoxicillin.   She  has a past medical history of Anemia; Anxiety; Arthritis; Asthma; Chest pain (02/25/2012); Complication of anesthesia; Depression; Diabetes mellitus; Disorder of soft tissue; Fibromyalgia; GERD (gastroesophageal reflux disease); Headache(784.0); Heart murmur; Hepatitis; Hypercholesteremia; Hypertension; Insomnia; Noninfectious gastroenteritis; Obesity; Shortness of breath; Sleep apnea; Tuberculosis; and Vitamin D deficiency.    She  reports that she quit smoking about 19 years ago. She has a 10.00 pack-year smoking history. She has never used smokeless tobacco. She reports that she drinks about 0.6 oz of alcohol per week . She reports that she does not use drugs. She  reports that she does not engage in sexual activity. The patient  has a past surgical history that includes Appendectomy; Cholecystectomy; Cosmetic surgery; Dilation and curettage of uterus; Colonoscopy; and Dilatation & currettage/hysteroscopy with resectoscope (N/A, 04/08/2014).  Her family history includes Cervical cancer in her maternal grandmother; Dementia in her mother; Diabetes type II in her maternal grandmother, mother, and sister; Heart  attack in her father; Hypertension in her mother; Stomach cancer in her maternal grandfather; Stroke in her mother.  Review of Systems  Constitutional: Negative for chills and fever.  Gastrointestinal: Negative for nausea.  Skin: Negative for rash.    The problem list and medications were reviewed and updated by myself where necessary and exist elsewhere in the encounter.   OBJECTIVE:  BP (!) 122/58 (BP Location: Right Arm, Patient Position: Sitting, Cuff Size: Large)   Pulse 89   Temp 98.2 F (36.8 C) (Oral)   Resp 18   Ht 5\' 1"  (1.549 m)   Wt 177 lb 9.6 oz (80.6 kg)   SpO2 98%   BMI 33.56 kg/m   Pulse Readings from Last 3 Encounters:  11/16/16 89  11/15/16 94  11/14/16 (!) 112   Physical Exam  Constitutional: She is oriented to person, place, and time. She appears well-developed and well-nourished.  Cardiovascular: Normal rate and regular rhythm.   Pulmonary/Chest: Effort normal and breath sounds normal.  Musculoskeletal: Normal range of motion.  Neurological: She is alert and oriented to person, place, and time.    Results for orders placed or performed during the hospital encounter of 11/15/16 (from the past 72 hour(s))  CBG monitoring, ED     Status: Abnormal   Collection Time: 11/15/16  5:00 PM  Result Value Ref Range   Glucose-Capillary 218 (H) 65 - 99 mg/dL  I-stat Chem 8, ED     Status: Abnormal   Collection Time: 11/15/16  7:54 PM  Result Value Ref Range   Sodium 137 135 - 145 mmol/L  Potassium 3.9 3.5 - 5.1 mmol/L   Chloride 103 101 - 111 mmol/L   BUN 10 6 - 20 mg/dL   Creatinine, Ser 0.60 0.44 - 1.00 mg/dL   Glucose, Bld 191 (H) 65 - 99 mg/dL   Calcium, Ion 1.20 1.15 - 1.40 mmol/L   TCO2 25 0 - 100 mmol/L   Hemoglobin 12.6 12.0 - 15.0 g/dL   HCT 37.0 36.0 - 46.0 %   Lab Results  Component Value Date   ALT 25 07/07/2014   AST 23 07/07/2014   ALKPHOS 85 07/07/2014   BILITOT 0.2 (L) 07/07/2014     No results found.  ASSESSMENT AND  PLAN:  Pam [name] was seen today for follow-up.  Diagnoses and all orders for this visit:  Cough Comments: She is improved, afebrile, and has a normal rate.  Advised she schedule her neb q4 hours as needed. She will continue pred.  Advised glipizide 2.5 only PRN as it may save her a trip to the ED.   Orders: -     albuterol (PROVENTIL) (2.5 MG/3ML) 0.083% nebulizer solution 2.5 mg; Take 3 mLs (2.5 mg total) by nebulization once. -     ipratropium (ATROVENT) nebulizer solution 0.5 mg; Take 2.5 mLs (0.5 mg total) by nebulization once.  Elevated glucose -     glipiZIDE (GLUCOTROL) 5 MG tablet; Take 0.5 tablets (2.5 mg total) by mouth 2 (two) times daily as needed.    The patient is advised to call or return to clinic if she does not see an improvement in symptoms, or to seek the care of the closest emergency department if she worsens with the above plan.   Philis Fendt, MHS, PA-C [facility] 11/16/2016 7:19 PM

## 2016-11-16 NOTE — Patient Instructions (Addendum)
Your vital signs have improved.  I don't think we are in the clear with regard to an asthma flare yet, however I think you are certainly headed in the right direction. It is okay to take your nebs every 4 hours if needed.   I have prescribed some glipizide that you can take if your sugar is too high while taking prednisone.  I have given you five pills and do not plan to keep you on this.       IF you received an x-ray today, you will receive an invoice from [facility]. Please contact [facility] at [phone] with questions or concerns regarding your invoice.   IF you received labwork today, you will receive an invoice from [place]. Please contact [facility] at [phone] with questions or concerns regarding your invoice.   Our billing staff will not be able to assist you with questions regarding bills from these companies.  You will be contacted with the lab results as soon as they are available. The fastest way to get your results is to activate your My Chart account. Instructions are located on the last page of this paperwork. If you have not heard from [place] regarding the results in 2 weeks, please contact this office.

## 2016-11-16 NOTE — Telephone Encounter (Signed)
rx up front 

## 2016-11-16 NOTE — Telephone Encounter (Signed)
Please write. Philis Fendt, MS, PA-C 4:36 PM, 11/16/2016

## 2016-11-17 LAB — CULTURE, GROUP A STREP: STREP A CULTURE: NEGATIVE

## 2016-11-20 ENCOUNTER — Ambulatory Visit (INDEPENDENT_AMBULATORY_CARE_PROVIDER_SITE_OTHER): Payer: Medicare Other

## 2016-11-20 ENCOUNTER — Ambulatory Visit (INDEPENDENT_AMBULATORY_CARE_PROVIDER_SITE_OTHER): Payer: Medicare Other | Admitting: Family Medicine

## 2016-11-20 VITALS — BP 127/82 | HR 83 | Temp 97.4°F | Resp 18 | Ht 61.0 in | Wt 177.0 lb

## 2016-11-20 DIAGNOSIS — R05 Cough: Secondary | ICD-10-CM

## 2016-11-20 DIAGNOSIS — R509 Fever, unspecified: Secondary | ICD-10-CM

## 2016-11-20 DIAGNOSIS — R059 Cough, unspecified: Secondary | ICD-10-CM

## 2016-11-20 DIAGNOSIS — J45901 Unspecified asthma with (acute) exacerbation: Secondary | ICD-10-CM | POA: Diagnosis not present

## 2016-11-20 LAB — POCT CBC
GRANULOCYTE PERCENT: 50 % (ref 37–80)
HEMATOCRIT: 37.9 % (ref 37.7–47.9)
Hemoglobin: 13.3 g/dL (ref 12.2–16.2)
Lymph, poc: 6.5 — AB (ref 0.6–3.4)
MCH, POC: 26.2 pg — AB (ref 27–31.2)
MCHC: 35 g/dL (ref 31.8–35.4)
MCV: 74.7 fL — AB (ref 80–97)
MID (cbc): 0.3 (ref 0–0.9)
MPV: 7 fL (ref 0–99.8)
POC GRANULOCYTE: 6.8 (ref 2–6.9)
POC LYMPH PERCENT: 47.5 %L (ref 10–50)
POC MID %: 2.5 %M (ref 0–12)
Platelet Count, POC: 338 10*3/uL (ref 142–424)
RBC: 5.07 M/uL (ref 4.04–5.48)
RDW, POC: 14.2 %
WBC: 13.6 10*3/uL — AB (ref 4.6–10.2)

## 2016-11-20 MED ORDER — LEVOFLOXACIN 500 MG PO TABS: 500.0000 mg | ORAL_TABLET | Freq: Every day | ORAL | 0 refills | Status: DC

## 2016-11-20 MED ORDER — ALBUTEROL SULFATE (2.5 MG/3ML) 0.083% IN NEBU
2.5000 mg | INHALATION_SOLUTION | Freq: Once | RESPIRATORY_TRACT | Status: AC
Start: 2016-11-20 — End: 2016-11-20
  Administered 2016-11-20: 2.5 mg via RESPIRATORY_TRACT

## 2016-11-20 MED ORDER — IPRATROPIUM BROMIDE 0.02 % IN SOLN
0.5000 mg | Freq: Once | RESPIRATORY_TRACT | Status: AC
  Administered 2016-11-20: 0.5 mg via RESPIRATORY_TRACT

## 2016-11-20 MED ORDER — IPRATROPIUM BROMIDE 0.03 % NA SOLN: 2.0000 | Freq: Two times a day (BID) | NASAL | 12 refills | Status: DC

## 2016-11-20 MED ORDER — HYDROCODONE-HOMATROPINE 5-1.5 MG/5ML PO SYRP: 5.0000 mL | ORAL_SOLUTION | Freq: Three times a day (TID) | ORAL | 0 refills | Status: DC | PRN

## 2016-11-20 NOTE — Patient Instructions (Addendum)
  Take the Hycodan cough syrup every 8 hours as you need it for cough.   Use the Atrovent in your nebulizer every 4 - 6 hours for the next several days to help with your cough and asthma.  Stop taking the doxycycline.  Start the [place].    This should get you feeling better in the next several days.  If you start feeling worse come back.   It was good to meet you!   IF you received an x-ray today, you will receive an invoice from [facility]. Please contact [facility] at [phone] with questions or concerns regarding your invoice.   IF you received labwork today, you will receive an invoice from [place] [facility]. Please contact [facility] at [phone] with questions or concerns regarding your invoice.   Our billing staff will not be able to assist you with questions regarding bills from these companies.  You will be contacted with the lab results as soon as they are available. The fastest way to get your results is to activate your My Chart account. Instructions are located on the last page of this paperwork. If you have not heard from [place] regarding the results in 2 weeks, please contact this office.
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
## 2016-11-21 ENCOUNTER — Telehealth: Payer: Self-pay

## 2016-11-21 NOTE — Telephone Encounter (Signed)
Patient is calling to change her atrovent prescription from a nasal spray to something she can put in to her nebulizor.  She states that she had spoken to [place] about this when she was here.  Please advise  Pharmacy: [facility]  [phone]

## 2016-11-21 NOTE — Telephone Encounter (Signed)
Clarified with [facility] pharmacy- Atrovent instructions to put in the nebulizer instead of nasal spray Pt will be notified of changes and that she can pick script at [place]

## 2016-12-02 ENCOUNTER — Ambulatory Visit
Admission: RE | Admit: 2016-12-02 | Discharge: 2016-12-02 | Disposition: A | Discharge status: Home / Self Care | Payer: Medicare Other | Source: Ambulatory Visit | Attending: Physician Assistant | Admitting: Physician Assistant

## 2016-12-02 DIAGNOSIS — R911 Solitary pulmonary nodule: Secondary | ICD-10-CM

## 2016-12-02 MED ORDER — IOPAMIDOL (ISOVUE-300) INJECTION 61%
75.0000 mL | Freq: Once | INTRAVENOUS | Status: AC | PRN
  Administered 2016-12-02: 75 mL via INTRAVENOUS

## 2017-03-01 ENCOUNTER — Other Ambulatory Visit: Payer: Self-pay | Admitting: Hematology & Oncology

## 2017-03-14 ENCOUNTER — Ambulatory Visit: Payer: Medicare Other | Admitting: Allergy

## 2017-04-11 ENCOUNTER — Ambulatory Visit (INDEPENDENT_AMBULATORY_CARE_PROVIDER_SITE_OTHER): Payer: Medicare Other | Admitting: Family Medicine

## 2017-04-11 ENCOUNTER — Encounter: Payer: Self-pay | Admitting: Family Medicine

## 2017-04-11 VITALS — BP 133/77 | HR 81 | Temp 98.0°F | Resp 18 | Ht 62.01 in | Wt 176.0 lb

## 2017-04-11 DIAGNOSIS — E119 Type 2 diabetes mellitus without complications: Secondary | ICD-10-CM | POA: Diagnosis not present

## 2017-04-11 DIAGNOSIS — M545 Low back pain, unspecified: Secondary | ICD-10-CM

## 2017-04-11 LAB — POCT URINALYSIS DIP (MANUAL ENTRY)
BILIRUBIN UA: NEGATIVE
BILIRUBIN UA: NEGATIVE mg/dL
GLUCOSE UA: NEGATIVE mg/dL
Nitrite, UA: NEGATIVE
Protein Ur, POC: NEGATIVE mg/dL
SPEC GRAV UA: 1.02 (ref 1.010–1.025)
UROBILINOGEN UA: 0.2 U/dL
pH, UA: 6 (ref 5.0–8.0)

## 2017-04-11 NOTE — Patient Instructions (Addendum)
     IF you received an x-ray today, you will receive an invoice from [facility]. Please contact [facility] at [phone] with questions or concerns regarding your invoice.   IF you received labwork today, you will receive an invoice from [facility]. Please contact [facility] at [phone] with questions or concerns regarding your invoice.   Our billing staff will not be able to assist you with questions regarding bills from these companies.  You will be contacted with the lab results as soon as they are available. The fastest way to get your results is to activate your My Chart account. Instructions are located on the last page of this paperwork. If you have not heard from us regarding the results in 2 weeks, please contact this office.      Back Pain, Adult Back pain is very common. The pain often gets better over time. The cause of back pain is usually not dangerous. Most people can learn to manage their back pain on their own. Follow these instructions at home: Watch your back pain for any changes. The following actions may help to lessen any pain you are feeling:  Stay active. Start with short walks on flat ground if you can. Try to walk farther each day.  Exercise regularly as told by your doctor. Exercise helps your back heal faster. It also helps avoid future injury by keeping your muscles strong and flexible.  Do not sit, drive, or stand in one place for more than 30 minutes.  Do not stay in bed. Resting more than 1-2 days can slow down your recovery.  Be careful when you bend or lift an object. Use good form when lifting: ? Bend at your knees. ? Keep the object close to your body. ? Do not twist.  Sleep on a firm mattress. Lie on your side, and bend your knees. If you lie on your back, put a pillow under your knees.  Take medicines only as told by your doctor.  Put ice on the injured area. ? Put ice in a plastic bag. ? Place a towel between your  skin and the bag. ? Leave the ice on for 20 minutes, 2-3 times a day for the first 2-3 days. After that, you can switch between ice and heat packs.  Avoid feeling anxious or stressed. Find good ways to deal with stress, such as exercise.  Maintain a healthy weight. Extra weight puts stress on your back.  Contact a doctor if:  You have pain that does not go away with rest or medicine.  You have worsening pain that goes down into your legs or buttocks.  You have pain that does not get better in one week.  You have pain at night.  You lose weight.  You have a fever or chills. Get help right away if:  You cannot control when you poop (bowel movement) or pee (urinate).  Your arms or legs feel weak.  Your arms or legs lose feeling (numbness).  You feel sick to your stomach (nauseous) or throw up (vomit).  You have belly (abdominal) pain.  You feel like you may pass out (faint). This information is not intended to replace advice given to you by your health care provider. Make sure you discuss any questions you have with your health care provider. Document Released: 04/15/2008 Document Revised: 04/04/2016 Document Reviewed: 03/01/2014 Elsevier Interactive Patient Education  2018 Elsevier Inc.  
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
## 2017-04-30 ENCOUNTER — Encounter: Payer: Self-pay | Admitting: Allergy

## 2017-04-30 ENCOUNTER — Ambulatory Visit (INDEPENDENT_AMBULATORY_CARE_PROVIDER_SITE_OTHER): Payer: Medicare Other | Admitting: Allergy

## 2017-04-30 VITALS — BP 128/62 | HR 81 | Temp 97.5°F | Resp 18 | Ht 60.0 in | Wt 172.8 lb

## 2017-04-30 DIAGNOSIS — J4520 Mild intermittent asthma, uncomplicated: Secondary | ICD-10-CM

## 2017-04-30 DIAGNOSIS — H1010 Acute atopic conjunctivitis, unspecified eye: Secondary | ICD-10-CM

## 2017-04-30 DIAGNOSIS — J309 Allergic rhinitis, unspecified: Secondary | ICD-10-CM | POA: Diagnosis not present

## 2017-04-30 MED ORDER — LEVOCETIRIZINE DIHYDROCHLORIDE 5 MG PO TABS: 5.0000 mg | ORAL_TABLET | Freq: Every evening | ORAL | 5 refills | Status: DC

## 2017-04-30 MED ORDER — FLUTICASONE PROPIONATE HFA 110 MCG/ACT IN AERO: 2.0000 | INHALATION_SPRAY | Freq: Two times a day (BID) | RESPIRATORY_TRACT | 5 refills | Status: DC
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
## 2017-04-30 NOTE — Patient Instructions (Addendum)
Asthma  -  Start Flovent 158mcg 2 puffs twice a day. Take every day.  Rinse/garlge after use.    - continue singulair 10mg  daily -- take in evening  - Have access to albuterol inhaler 2 puffs or albuterol neb/ipratropium neb every 4-6 hours as needed for cough, wheeze, shortness of breath or chest tightness. Monitor frequency of use.  Asthma control goals:   Full participation in all desired activities (may need albuterol before activity)  Albuterol use two time or less a week on average (not counting use with activity)  Cough interfering with sleep two time or less a month  Oral steroids no more than once a year  No hospitalizations  Allergic rhinoconjunctivitis    - allergy testing today was positive for grasses, weeds, trees, dust mites, cat, dog, horse, cockroach.  Allergen avoidance measures discussed/handouts provided.      - trial Xyzal 5mg  daily     - flonase 1-2 sprays each nostril for nasal congestion/drainage.  Use for 1-2 weeks at a time before stopping once improved.  Use with proper nasal spray technique.      - use Aloway OTC antihistamine eyedrop 1 drop as needed for itchy/watery/red eyes   Follow-up in 6 months or sooner if needed

## 2017-07-16 ENCOUNTER — Ambulatory Visit (INDEPENDENT_AMBULATORY_CARE_PROVIDER_SITE_OTHER): Payer: Medicare Other | Admitting: Physician Assistant

## 2017-07-16 ENCOUNTER — Encounter: Payer: Self-pay | Admitting: Physician Assistant

## 2017-07-16 VITALS — BP 107/63 | HR 88 | Temp 98.5°F | Resp 18 | Ht 61.22 in | Wt 167.6 lb

## 2017-07-16 DIAGNOSIS — R208 Other disturbances of skin sensation: Secondary | ICD-10-CM

## 2017-07-16 DIAGNOSIS — R270 Ataxia, unspecified: Secondary | ICD-10-CM | POA: Diagnosis not present

## 2017-07-16 DIAGNOSIS — R51 Headache: Secondary | ICD-10-CM | POA: Diagnosis not present

## 2017-07-16 DIAGNOSIS — R42 Dizziness and giddiness: Secondary | ICD-10-CM

## 2017-07-16 DIAGNOSIS — R531 Weakness: Secondary | ICD-10-CM | POA: Diagnosis not present

## 2017-07-16 DIAGNOSIS — R29818 Other symptoms and signs involving the nervous system: Secondary | ICD-10-CM

## 2017-07-16 DIAGNOSIS — R519 Headache, unspecified: Secondary | ICD-10-CM

## 2017-07-16 LAB — POCT URINALYSIS DIP (MANUAL ENTRY)
Bilirubin, UA: NEGATIVE
GLUCOSE UA: NEGATIVE mg/dL
Ketones, POC UA: NEGATIVE mg/dL
Leukocytes, UA: NEGATIVE
Nitrite, UA: NEGATIVE
Protein Ur, POC: NEGATIVE mg/dL
UROBILINOGEN UA: 0.2 U/dL
pH, UA: 5.5 (ref 5.0–8.0)

## 2017-07-16 LAB — POCT CBC
GRANULOCYTE PERCENT: 61.4 % (ref 37–80)
HCT, POC: 39.4 % (ref 37.7–47.9)
HEMOGLOBIN: 13 g/dL (ref 12.2–16.2)
Lymph, poc: 2.1 (ref 0.6–3.4)
MCH: 25.3 pg — AB (ref 27–31.2)
MCHC: 32.9 g/dL (ref 31.8–35.4)
MCV: 76.7 fL — AB (ref 80–97)
MID (cbc): 0.3 (ref 0–0.9)
MPV: 6.8 fL (ref 0–99.8)
PLATELET COUNT, POC: 321 10*3/uL (ref 142–424)
POC Granulocyte: 3.8 (ref 2–6.9)
POC LYMPH PERCENT: 33.3 %L (ref 10–50)
POC MID %: 5.3 %M (ref 0–12)
RBC: 5.13 M/uL (ref 4.04–5.48)
RDW, POC: 14.4 %
WBC: 6.2 10*3/uL (ref 4.6–10.2)

## 2017-07-16 LAB — CMP14+EGFR
A/G RATIO: 1.4 (ref 1.2–2.2)
ALK PHOS: 69 IU/L (ref 39–117)
ALT: 13 IU/L (ref 0–32)
AST: 16 IU/L (ref 0–40)
Albumin: 4.3 g/dL (ref 3.6–4.8)
BUN/Creatinine Ratio: 17 (ref 12–28)
BUN: 13 mg/dL (ref 8–27)
Bilirubin Total: 0.2 mg/dL (ref 0.0–1.2)
CALCIUM: 9.8 mg/dL (ref 8.7–10.3)
CO2: 21 mmol/L (ref 20–29)
Chloride: 101 mmol/L (ref 96–106)
Creatinine, Ser: 0.75 mg/dL (ref 0.57–1.00)
GFR calc Af Amer: 96 mL/min/{1.73_m2} (ref >59)
GFR, EST NON AFRICAN AMERICAN: 83 mL/min/{1.73_m2} (ref >59)
Globulin, Total: 3 g/dL (ref 1.5–4.5)
Glucose: 189 mg/dL — ABNORMAL HIGH (ref 65–99)
Potassium: 3.9 mmol/L (ref 3.5–5.2)
Sodium: 141 mmol/L (ref 134–144)
Total Protein: 7.3 g/dL (ref 6.0–8.5)

## 2017-07-16 LAB — GLUCOSE, POCT (MANUAL RESULT ENTRY): POC Glucose: 200 mg/dl — AB (ref 70–99)

## 2017-07-16 LAB — SEDIMENTATION RATE: SED RATE: 28 mm/h (ref 0–40)

## 2017-07-16 MED ORDER — GABAPENTIN 100 MG PO CAPS: 200.0000 mg | ORAL_CAPSULE | Freq: Every day | ORAL | 2 refills | Status: DC

## 2017-07-16 MED ORDER — DULOXETINE HCL 30 MG PO CPEP: 30.0000 mg | ORAL_CAPSULE | Freq: Every day | ORAL | 2 refills | Status: DC

## 2017-07-16 NOTE — Patient Instructions (Addendum)
Your orthostats and labs look fine.   Please increase your water intake to 64 oz.  I would like you to follow up with your therapist asap as we discussed Please await contact for the imaging. I also want you to follow up with your pcp asap.     IF you received an x-ray today, you will receive an invoice from [facility]. Please contact [facility] at [phone] with questions or concerns regarding your invoice.   IF you received labwork today, you will receive an invoice from [facility]. Please contact [facility] at [phone] with questions or concerns regarding your invoice.   Our billing staff will not be able to assist you with questions regarding bills from these companies.  You will be contacted with the lab results as soon as they are available. The fastest way to get your results is to activate your My Chart account. Instructions are located on the last page of this paperwork. If you have not heard from [place] regarding the results in 2 weeks, please contact this office.
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
## 2017-07-17 ENCOUNTER — Telehealth: Payer: Self-pay | Admitting: Physician Assistant

## 2017-07-17 ENCOUNTER — Ambulatory Visit (HOSPITAL_COMMUNITY)
Admission: RE | Admit: 2017-07-17 | Discharge: 2017-07-17 | Disposition: A | Discharge status: Home / Self Care | Payer: Medicare Other | Source: Ambulatory Visit | Attending: Physician Assistant | Admitting: Physician Assistant

## 2017-07-17 DIAGNOSIS — R29818 Other symptoms and signs involving the nervous system: Secondary | ICD-10-CM | POA: Diagnosis not present

## 2017-07-17 DIAGNOSIS — R42 Dizziness and giddiness: Secondary | ICD-10-CM

## 2017-07-17 DIAGNOSIS — R531 Weakness: Secondary | ICD-10-CM | POA: Insufficient documentation

## 2017-07-17 DIAGNOSIS — R51 Headache: Secondary | ICD-10-CM | POA: Insufficient documentation

## 2017-07-17 DIAGNOSIS — R270 Ataxia, unspecified: Secondary | ICD-10-CM

## 2017-07-17 DIAGNOSIS — R208 Other disturbances of skin sensation: Secondary | ICD-10-CM

## 2017-07-17 DIAGNOSIS — R519 Headache, unspecified: Secondary | ICD-10-CM

## 2017-07-17 NOTE — Telephone Encounter (Signed)
Called [facility] and [facility] to schedule pt MRI. [facility] did not have anything until 9/17 and [facility] only had something today at 5pm for this week. I tried calling the pt to see if she could make this appt at [facility] but had to leave a message. I did leave a message with the appt time as well as where it was at and [facility]'s scheduling number in case this did not work. I called [facility] back to let them know I was unable to speak with the pt and they said they would leave her on the schedule and hopefully she would hear her vm around lunch time. Thanks!

## 2017-07-17 NOTE — Telephone Encounter (Signed)
Thank you.  Please have
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
## 2017-10-15 ENCOUNTER — Ambulatory Visit: Payer: Self-pay | Admitting: *Deleted

## 2017-10-15 NOTE — Telephone Encounter (Signed)
  Reason for Disposition . [1] MODERATE longstanding difficulty breathing (e.g., speaks in phrases, SOB even at rest, pulse 100-120) AND [2] SAME as normal  Answer Assessment - Initial Assessment Questions 1. RESPIRATORY STATUS: "Describe your breathing?" (e.g., wheezing, shortness of breath, unable to speak, severe coughing)      SOB with activity 2. ONSET: "When did this breathing problem begin?"      yesterday 3. PATTERN "Does the difficult breathing come and go, or has it been constant since it started?"      Come and go 4. SEVERITY: "How bad is your breathing?" (e.g., mild, moderate, severe)    - MILD: No SOB at rest, mild SOB with walking, speaks normally in sentences, can lay down, no retractions, pulse < 100.    - MODERATE: SOB at rest, SOB with minimal exertion and prefers to sit, cannot lie down flat, speaks in phrases, mild retractions, audible wheezing, pulse 100-120.    - SEVERE: Very SOB at rest, speaks in single words, struggling to breathe, sitting hunched forward, retractions, pulse > 120      mild 5. RECURRENT SYMPTOM: "Have you had difficulty breathing before?" If so, ask: "When was the last time?" and "What happened that time?"      Yes 6. CARDIAC HISTORY: "Do you have any history of heart disease?" (e.g., heart attack, angina, bypass surgery, angioplasty)      No 7. LUNG HISTORY: "Do you have any history of lung disease?"  (e.g., pulmonary embolus, asthma, emphysema)     Asthma,seasonal allergies 8. CAUSE: "What do you think is causing the breathing problem?"      Contact with a dog about a week ago, has allergies to dog fur 9. OTHER SYMPTOMS: "Do you have any other symptoms? (e.g., dizziness, runny nose, cough, chest pain, fever)     Cough-productive with white sputum,  10. PREGNANCY: "Is there any chance you are pregnant?" "When was your last menstrual period?"       No 11. TRAVEL: "Have you traveled out of the country in the last month?" (e.g., travel history,  exposures)       No  Protocols used: BREATHING DIFFICULTY-A-AH  Pt states she has seasonal allergies and a history of asthma. Pt feels like being around a person who had a dog, which she is allergic to exacerbated her current condition. Pt has tried saline spray, rescue inhaler and nebulizer treatments, but states her current meds for the nebulizer are out of date. Pt also have complaints of ear congestion with productive cough and has some tightness in chest above the breast area on both sides of chest. Pt wanted to make appt to be seen for tomorrow, which was scheduled, but pt also advised to seek treatment in the ER if symptoms worsen. Pt verbalized understanding.

## 2017-10-16 ENCOUNTER — Encounter: Payer: Self-pay | Admitting: Family Medicine

## 2017-10-16 ENCOUNTER — Ambulatory Visit: Payer: Medicare Other | Admitting: Family Medicine

## 2017-10-16 ENCOUNTER — Other Ambulatory Visit: Payer: Self-pay

## 2017-10-16 VITALS — BP 142/66 | HR 80 | Temp 98.7°F | Resp 18 | Ht 61.22 in | Wt 173.4 lb

## 2017-10-16 DIAGNOSIS — J4541 Moderate persistent asthma with (acute) exacerbation: Secondary | ICD-10-CM

## 2017-10-16 MED ORDER — ALBUTEROL SULFATE HFA 108 (90 BASE) MCG/ACT IN AERS: 2.0000 | INHALATION_SPRAY | Freq: Four times a day (QID) | RESPIRATORY_TRACT | 3 refills | Status: DC | PRN

## 2017-10-16 MED ORDER — MONTELUKAST SODIUM 10 MG PO TABS: 10.0000 mg | ORAL_TABLET | Freq: Every day | ORAL | 3 refills | Status: DC

## 2017-10-16 MED ORDER — GUAIFENESIN ER 600 MG PO TB12: 600.0000 mg | ORAL_TABLET | Freq: Two times a day (BID) | ORAL | 3 refills | Status: DC

## 2017-10-16 MED ORDER — FLUTICASONE PROPIONATE HFA 110 MCG/ACT IN AERO: 2.0000 | INHALATION_SPRAY | Freq: Two times a day (BID) | RESPIRATORY_TRACT | 5 refills | Status: DC

## 2017-10-16 MED ORDER — ALBUTEROL SULFATE (2.5 MG/3ML) 0.083% IN NEBU: 2.5000 mg | INHALATION_SOLUTION | Freq: Four times a day (QID) | RESPIRATORY_TRACT | 1 refills | Status: DC | PRN
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
## 2017-10-16 NOTE — Patient Instructions (Addendum)
1. Twice a day take flovent inhaler 2. In the evenings take montelukast 3. For cough and tightness take albuterol as needed up to every 4-6 hours 4. Take mucinex as well for cough 5. When you can tolerate it please add xyzal for cough and seasonal allergies    IF you received an x-ray today, you will receive an invoice from [facility]. Please contact [facility] at [phone] with questions or concerns regarding your invoice.   IF you received labwork today, you will receive an invoice from [facility]. Please contact [facility] at [phone] with questions or concerns regarding your invoice.   Our billing staff will not be able to assist you with questions regarding bills from these companies.  You will be contacted with the lab results as soon as they are available. The fastest way to get your results is to activate your My Chart account. Instructions are located on the last page of this paperwork. If you have not heard from [place] regarding the results in 2 weeks, please contact this office.     Asthma Attack Prevention, Adult Although you may not be able to control the fact that you have asthma, you can take actions to prevent episodes of asthma (asthma attacks). These actions include:  Creating a written plan for managing and treating your asthma attacks (asthma action plan).  Monitoring your asthma.  Avoiding things that can irritate your airways or make your asthma symptoms worse (asthma triggers).  Taking your medicines as directed.  Acting quickly if you have signs or symptoms of an asthma attack.  What are some ways to prevent an asthma attack? Create a plan Work with your health care provider to create an asthma action plan. This plan should include:  A list of your asthma triggers and how to avoid them.  A list of symptoms that you experience during an asthma attack.  Information about when to take medicine and how much medicine to take.  Information  to help you understand your peak flow measurements.  Contact information for your health care providers.  Daily actions that you can take to control asthma.  Monitor your asthma  To monitor your asthma:  Use your peak flow meter every morning and every evening for 2-3 weeks. Record the results in a journal. A drop in your peak flow numbers on one or more days may mean that you are starting to have an asthma attack, even if you are not having symptoms.  When you have asthma symptoms, write them down in a journal.  Avoid asthma triggers  Work with your health care provider to find out what your asthma triggers are. This can be done by:  Being tested for allergies.  Keeping a journal that notes when asthma attacks occur and what may have contributed to them.  Asking your health care provider whether other medical conditions make your asthma worse.  Common asthma triggers include:  Dust.  Smoke. This includes campfire smoke and secondhand smoke from tobacco products.  Pet dander.  Trees, grasses or pollens.  Very cold, dry, or humid air.  Mold.  Foods that contain high amounts of sulfites.  Strong smells.  Engine exhaust and air pollution.  Aerosol sprays and fumes from household cleaners.  Household pests and their droppings, including dust mites and cockroaches.  Certain medicines, including NSAIDs.  Once you have determined your asthma triggers, take steps to avoid them. Depending on your triggers, you may be able to reduce the chance of an asthma  attack by:  Keeping your home clean. Have someone dust and vacuum your home for you 1 or 2 times a week. If possible, have them use a high-efficiency particulate arrestance (HEPA) vacuum.  Washing your sheets weekly in hot water.  Using allergy-proof mattress covers and casings on your bed.  Keeping pets out of your home.  Taking care of mold and water problems in your home.  Avoiding areas where people  smoke.  Avoiding using strong perfumes or odor sprays.  Avoid spending a lot of time outdoors when pollen counts are high and on very windy days.  Talking with your health care provider before stopping or starting any new medicines.  Medicines Take over-the-counter and prescription medicines only as told by your health care provider. Many asthma attacks can be prevented by carefully following your medicine schedule. Taking your medicines correctly is especially important when you cannot avoid certain asthma triggers. Even if you are doing well, do not stop taking your medicine and do not take less medicine. Act quickly If an asthma attack happens, acting quickly can decrease how severe it is and how long it lasts. Take these actions:  Pay attention to your symptoms. If you are coughing, wheezing, or having difficulty breathing, do not wait to see if your symptoms go away on their own. Follow your asthma action plan.  If you have followed your asthma action plan and your symptoms are not improving, call your health care provider or seek immediate medical care at the nearest hospital.  It is important to write down how often you need to use your fast-acting rescue inhaler. You can track how often you use an inhaler in your journal. If you are using your rescue inhaler more often, it may mean that your asthma is not under control. Adjusting your asthma treatment plan may help you to prevent future asthma attacks and help you to gain better control of your condition. How can I prevent an asthma attack when I exercise?  Exercise is a common asthma trigger. To prevent asthma attacks during exercise:  Follow advice from your health care provider about whether you should use your fast-acting inhaler before exercising. Many people with asthma experience exercise-induced bronchoconstriction (EIB). This condition often worsens during vigorous exercise in cold, humid, or dry environments. Usually, people  with EIB can stay very active by using a fast-acting inhaler before exercising.  Avoid exercising outdoors in very cold or humid weather.  Avoid exercising outdoors when pollen counts are high.  Warm up and cool down when exercising.  Stop exercising right away if asthma symptoms start.  Consider taking part in exercises that are less likely to cause asthma symptoms such as:  Indoor swimming.  Biking.  Walking.  Hiking.  Playing football.  This information is not intended to replace advice given to you by your health care provider. Make sure you discuss any questions you have with your health care provider. Document Released: 10/16/2009 Document Revised: 06/28/2016 Document Reviewed: 04/13/2016 Elsevier Interactive Patient Education  2017 [employer].

## 2017-11-12 ENCOUNTER — Other Ambulatory Visit: Payer: Self-pay | Admitting: Physician Assistant

## 2017-11-12 DIAGNOSIS — R208 Other disturbances of skin sensation: Secondary | ICD-10-CM

## 2017-11-12 DIAGNOSIS — R42 Dizziness and giddiness: Secondary | ICD-10-CM

## 2017-11-12 DIAGNOSIS — R519 Headache, unspecified: Secondary | ICD-10-CM

## 2017-11-12 DIAGNOSIS — R51 Headache: Secondary | ICD-10-CM

## 2017-11-12 NOTE — Telephone Encounter (Signed)
Please advise 

## 2017-11-12 NOTE — Telephone Encounter (Signed)
Per the notes pt is no longer taking this drug as of OV on 10/16/17 per Dr. Nolon Rod.

## 2018-01-16 ENCOUNTER — Other Ambulatory Visit: Payer: Self-pay | Admitting: Internal Medicine

## 2018-01-16 DIAGNOSIS — Z1231 Encounter for screening mammogram for malignant neoplasm of breast: Secondary | ICD-10-CM

## 2018-02-04 ENCOUNTER — Ambulatory Visit
Admission: RE | Admit: 2018-02-04 | Discharge: 2018-02-04 | Disposition: A | Discharge status: Home / Self Care | Payer: Medicare Other | Source: Ambulatory Visit | Attending: Internal Medicine | Admitting: Internal Medicine

## 2018-02-04 DIAGNOSIS — Z1231 Encounter for screening mammogram for malignant neoplasm of breast: Secondary | ICD-10-CM

## 2018-02-18 ENCOUNTER — Encounter: Payer: Self-pay | Admitting: Physician Assistant

## 2018-02-20 ENCOUNTER — Other Ambulatory Visit: Payer: Self-pay | Admitting: Internal Medicine

## 2018-02-20 DIAGNOSIS — E2839 Other primary ovarian failure: Secondary | ICD-10-CM

## 2018-03-04 ENCOUNTER — Other Ambulatory Visit: Payer: Self-pay

## 2018-03-04 ENCOUNTER — Ambulatory Visit: Payer: Medicare Other | Admitting: Family Medicine

## 2018-03-04 ENCOUNTER — Encounter: Payer: Self-pay | Admitting: Family Medicine

## 2018-03-04 VITALS — BP 127/81 | HR 97 | Temp 98.6°F | Resp 17 | Ht 61.22 in | Wt 174.2 lb

## 2018-03-04 DIAGNOSIS — J301 Allergic rhinitis due to pollen: Secondary | ICD-10-CM

## 2018-03-04 DIAGNOSIS — J4541 Moderate persistent asthma with (acute) exacerbation: Secondary | ICD-10-CM | POA: Diagnosis not present

## 2018-03-04 DIAGNOSIS — R05 Cough: Secondary | ICD-10-CM | POA: Diagnosis not present

## 2018-03-04 DIAGNOSIS — R059 Cough, unspecified: Secondary | ICD-10-CM
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
## 2018-03-04 NOTE — Patient Instructions (Addendum)
IF you received an x-ray today, you will receive an invoice from [facility]. Please contact [facility] at [phone] with questions or concerns regarding your invoice.   IF you received labwork today, you will receive an invoice from [facility]. Please contact [facility] at [phone] with questions or concerns regarding your invoice.   Our billing staff will not be able to assist you with questions regarding bills from these companies.  You will be contacted with the lab results as soon as they are available. The fastest way to get your results is to activate your My Chart account. Instructions are located on the last page of this paperwork. If you have not heard from [place] regarding the results in 2 weeks, please contact this office.    Allergic Rhinitis, Adult Allergic rhinitis is an allergic reaction that affects the mucous membrane inside the nose. It causes sneezing, a runny or stuffy nose, and the feeling of mucus going down the back of the throat (postnasal drip). Allergic rhinitis can be mild to severe. There are two types of allergic rhinitis:  Seasonal. This type is also called hay fever. It happens only during certain seasons.  Perennial. This type can happen at any time of the year.  What are the causes? This condition happens when the body's defense system (immune system) responds to certain harmless substances called allergens as though they were germs.  Seasonal allergic rhinitis is triggered by pollen, which can come from grasses, trees, and weeds. Perennial allergic rhinitis may be caused by:  House dust mites.  Pet dander.  Mold spores.  What are the signs or symptoms? Symptoms of this condition include:  Sneezing.  Runny or stuffy nose (nasal congestion).  Postnasal drip.  Itchy nose.  Tearing of the eyes.  Trouble sleeping.  Daytime sleepiness.  How is this diagnosed? This condition may be diagnosed based on:  Your medical  history.  A physical exam.  Tests to check for related conditions, such as: ? Asthma. ? Pink eye. ? Ear infection. ? Upper respiratory infection.  Tests to find out which allergens trigger your symptoms. These may include skin or blood tests.  How is this treated? There is no cure for this condition, but treatment can help control symptoms. Treatment may include:  Taking medicines that block allergy symptoms, such as antihistamines. Medicine may be given as a shot, nasal spray, or pill.  Avoiding the allergen.  Desensitization. This treatment involves getting ongoing shots until your body becomes less sensitive to the allergen. This treatment may be done if other treatments do not help.  If taking medicine and avoiding the allergen does not work, new, stronger medicines may be prescribed.  Follow these instructions at home:  Find out what you are allergic to. Common allergens include smoke, dust, and pollen.  Avoid the things you are allergic to. These are some things you can do to help avoid allergens: ? Replace carpet with wood, tile, or vinyl flooring. Carpet can trap dander and dust. ? Do not smoke. Do not allow smoking in your home. ? Change your heating and air conditioning filter at least once a month. ? During allergy season:  Keep windows closed as much as possible.  Plan outdoor activities when pollen counts are lowest. This is usually during the evening hours.  When coming indoors, change clothing and shower before sitting on furniture or bedding.  Take over-the-counter and prescription medicines only as told by your health care provider.  Keep all  follow-up visits as told by your health care provider. This is important. Contact a health care provider if:  You have a fever.  You develop a persistent cough.  You make whistling sounds when you breathe (you wheeze).  Your symptoms interfere with your normal daily activities. Get help right away if:  You  have shortness of breath. Summary  This condition can be managed by taking medicines as directed and avoiding allergens.  Contact your health care provider if you develop a persistent cough or fever.  During allergy season, keep windows closed as much as possible. This information is not intended to replace advice given to you by your health care provider. Make sure you discuss any questions you have with your health care provider. Document Released: 07/23/2001 Document Revised: 12/05/2016 Document Reviewed: 12/05/2016 Elsevier Interactive Patient Education  2018 Elsevier Inc[employer]  

## 2018-03-09 ENCOUNTER — Ambulatory Visit: Payer: Medicare Other | Admitting: Family Medicine

## 2018-03-09 ENCOUNTER — Other Ambulatory Visit: Payer: Self-pay

## 2018-03-09 ENCOUNTER — Encounter: Payer: Self-pay | Admitting: Family Medicine

## 2018-03-09 VITALS — BP 133/87 | HR 91 | Temp 98.7°F | Resp 17 | Ht 61.22 in | Wt 178.4 lb

## 2018-03-09 DIAGNOSIS — J4541 Moderate persistent asthma with (acute) exacerbation: Secondary | ICD-10-CM | POA: Diagnosis not present

## 2018-03-09 MED ORDER — PREDNISONE 20 MG PO TABS: 40.0000 mg | ORAL_TABLET | Freq: Every day | ORAL | 0 refills | Status: AC
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
## 2018-03-09 NOTE — Patient Instructions (Addendum)
IF you received an x-ray today, you will receive an invoice from [facility]. Please contact [facility] at [phone] with questions or concerns regarding your invoice.   IF you received labwork today, you will receive an invoice from [facility]. Please contact [facility] at [phone] with questions or concerns regarding your invoice.   Our billing staff will not be able to assist you with questions regarding bills from these companies.  You will be contacted with the lab results as soon as they are available. The fastest way to get your results is to activate your My Chart account. Instructions are located on the last page of this paperwork. If you have not heard from [place] regarding the results in 2 weeks, please contact this office.     How to Use a Nebulizer, Adult A nebulizer is a device that turns liquid medicine into a mist (vapor) that you can breathe in (inhale). You may need to use a nebulizer if you have a breathing illness, such as asthma or pneumonia. There are different kinds of nebulizers. With some, you breathe in through a mouthpiece. With others, a mask fits over your nose and mouth. Risks and complications Using a nebulizer that does not fit right or is not cleaned right can lead to the following complications:  Infection.  Eye irritation.  Delivery of too much medicine or not enough medicine.  Mouth irritation.  How to prepare before using a nebulizer Take these steps before using your nebulizer: 1. Check your medicine. Make sure it has not expired and is not damaged in any way. 2. Wash your hands with soap and water. 3. Put all of the parts of your nebulizer on a sturdy, flat surface. Make sure all of the tubing is connected. 4. Measure the liquid medicine according to instructions from your health care provider. Pour the liquid into the part of the nebulizer that holds the medicine (reservoir). 5. Attach the mouthpiece or mask. 6. Test the  nebulizer by turning it on to make sure that a spray comes out. Then, turn it off.  How to use a nebulizer 1. Sit down and relax. 2. If your nebulizer has a mask, put it over your nose and mouth. It should fit somewhat snugly, with no gaps around the nose or cheeks where medicine could escape. If you use a mouthpiece, put it in your mouth. Press your lips firmly around the mouthpiece. 3. Turn on the nebulizer. 4. Breathe out (exhale). 5. Some nebulizers have a finger valve. If yours does, cover up the air hole so the air gets to the nebulizer. 6. Once the medicine begins to mist out, take slow, deep breaths. If there is a finger valve, release it at the end of your breath. 7. Continue taking slow, deep breaths until the medicine in the nebulizer is gone and no vapor appears. Be sure to stop the machine at any time if you start coughing or if the medicine foams or bubbles. How to clean a nebulizer The nebulizer and all of its parts must be kept very clean. If the nebulizer and its parts are not cleaned properly, bacteria can grow inside of them. If you inhale the bacteria, you can get sick. Follow the manufacturer's instructions for cleaning your nebulizer. For most nebulizers, you should follow these guidelines:  Wash the nebulizer after each use. Use warm water and soap. Make sure to wash the mouthpiece or mask and the medicine area, but do not wash the tubing  or mouthpiece.  After you wash the nebulizer, place its parts on a clean towel and let them dry completely. After they dry, reconnect the pieces and turn the nebulizer on without any medicine in it. Doing this will blow air through the equipment to help dry it out.  Store the nebulizer in a clean and dust-free place.  Check the filter at least one time every week. Replace it if it looks dirty.  Contact a health care provider if:  You continue to have trouble breathing.  You have trouble using the nebulizer.  Your breathing gets  worse during a nebulizer treatment.  Your nebulizer stops working, foams, or does not create a mist after you add medicine and turn it on. This information is not intended to replace advice given to you by your health care provider. Make sure you discuss any questions you have with your health care provider. Document Released: 10/16/2009 Document Revised: 06/27/2016 Document Reviewed: 05/04/2016 Elsevier Interactive Patient Education  [employer].

## 2018-03-20 ENCOUNTER — Other Ambulatory Visit: Payer: Medicare Other

## 2018-03-26 ENCOUNTER — Ambulatory Visit: Payer: Medicare Other | Admitting: Family Medicine

## 2018-03-26 ENCOUNTER — Encounter: Payer: Self-pay | Admitting: Family Medicine

## 2018-03-26 ENCOUNTER — Other Ambulatory Visit: Payer: Self-pay

## 2018-03-26 VITALS — BP 114/76 | HR 100 | Temp 99.5°F | Resp 16 | Ht 61.22 in | Wt 177.2 lb

## 2018-03-26 DIAGNOSIS — L600 Ingrowing nail: Secondary | ICD-10-CM

## 2018-03-26 DIAGNOSIS — J4541 Moderate persistent asthma with (acute) exacerbation: Secondary | ICD-10-CM | POA: Diagnosis not present

## 2018-03-26 MED ORDER — FLUTICASONE-SALMETEROL 250-50 MCG/DOSE IN AEPB: 1.0000 | INHALATION_SPRAY | Freq: Two times a day (BID) | RESPIRATORY_TRACT | 0 refills | Status: DC
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
## 2018-03-26 NOTE — Patient Instructions (Addendum)
  Try xyzal instead of zyrtec Increase flonase to twice a day  stop flovent and change to fluticasone/salmeterol (advair) Take the Montelukast at bedtime daily Use your albuterol every 4 hours while awake then go back to as needed in a week.  If there is no improvement with perfect compliance I would referral to Allergy/Pulmonology.  Follow up in one month    IF you received an x-ray today, you will receive an invoice from [facility]. Please contact [facility] at [phone] with questions or concerns regarding your invoice.   IF you received labwork today, you will receive an invoice from [facility]. Please contact [facility] at [phone] with questions or concerns regarding your invoice.   Our billing staff will not be able to assist you with questions regarding bills from these companies.  You will be contacted with the lab results as soon as they are available. The fastest way to get your results is to activate your My Chart account. Instructions are located on the last page of this paperwork. If you have not heard from [place] regarding the results in 2 weeks, please contact this office.

## 2018-04-01 ENCOUNTER — Emergency Department (HOSPITAL_COMMUNITY)
Admission: EM | Admit: 2018-04-01 | Discharge: 2018-04-01 | Disposition: A | Discharge status: Home / Self Care | Payer: Medicare Other | Attending: Emergency Medicine | Admitting: Emergency Medicine

## 2018-04-01 ENCOUNTER — Encounter (HOSPITAL_COMMUNITY): Payer: Self-pay | Admitting: Emergency Medicine

## 2018-04-01 ENCOUNTER — Other Ambulatory Visit: Payer: Self-pay

## 2018-04-01 ENCOUNTER — Emergency Department (HOSPITAL_COMMUNITY): Payer: Medicare Other

## 2018-04-01 DIAGNOSIS — Z79899 Other long term (current) drug therapy: Secondary | ICD-10-CM | POA: Insufficient documentation

## 2018-04-01 DIAGNOSIS — M940 Chondrocostal junction syndrome [Tietze]: Secondary | ICD-10-CM

## 2018-04-01 DIAGNOSIS — R05 Cough: Secondary | ICD-10-CM | POA: Insufficient documentation

## 2018-04-01 DIAGNOSIS — R0789 Other chest pain: Secondary | ICD-10-CM | POA: Diagnosis present

## 2018-04-01 DIAGNOSIS — J45909 Unspecified asthma, uncomplicated: Secondary | ICD-10-CM | POA: Insufficient documentation

## 2018-04-01 DIAGNOSIS — R0981 Nasal congestion: Secondary | ICD-10-CM | POA: Insufficient documentation

## 2018-04-01 DIAGNOSIS — Z87891 Personal history of nicotine dependence: Secondary | ICD-10-CM | POA: Diagnosis not present

## 2018-04-01 DIAGNOSIS — E119 Type 2 diabetes mellitus without complications: Secondary | ICD-10-CM | POA: Diagnosis not present

## 2018-04-01 DIAGNOSIS — Z7984 Long term (current) use of oral hypoglycemic drugs: Secondary | ICD-10-CM | POA: Insufficient documentation

## 2018-04-01 DIAGNOSIS — I10 Essential (primary) hypertension: Secondary | ICD-10-CM | POA: Insufficient documentation

## 2018-04-01 LAB — BASIC METABOLIC PANEL
ANION GAP: 10 (ref 5–15)
BUN: 8 mg/dL (ref 6–20)
CALCIUM: 9.2 mg/dL (ref 8.9–10.3)
CO2: 25 mmol/L (ref 22–32)
Chloride: 103 mmol/L (ref 101–111)
Creatinine, Ser: 0.63 mg/dL (ref 0.44–1.00)
GFR calc Af Amer: >60 mL/min (ref >60)
GFR calc non Af Amer: >60 mL/min (ref >60)
GLUCOSE: 171 mg/dL — AB (ref 65–99)
Potassium: 4 mmol/L (ref 3.5–5.1)
Sodium: 138 mmol/L (ref 135–145)

## 2018-04-01 LAB — CBC
HCT: 39.3 % (ref 36.0–46.0)
HEMOGLOBIN: 13.1 g/dL (ref 12.0–15.0)
MCH: 25.9 pg — AB (ref 26.0–34.0)
MCHC: 33.3 g/dL (ref 30.0–36.0)
MCV: 77.7 fL — ABNORMAL LOW (ref 78.0–100.0)
Platelets: 346 10*3/uL (ref 150–400)
RBC: 5.06 MIL/uL (ref 3.87–5.11)
RDW: 14 % (ref 11.5–15.5)
WBC: 6.3 10*3/uL (ref 4.0–10.5)

## 2018-04-01 LAB — I-STAT TROPONIN, ED
TROPONIN I, POC: 0 ng/mL (ref 0.00–0.08)
Troponin i, poc: 0.02 ng/mL (ref 0.00–0.08)

## 2018-04-01 MED ORDER — KETOROLAC TROMETHAMINE 15 MG/ML IJ SOLN
30.0000 mg | Freq: Once | INTRAMUSCULAR | Status: AC
  Administered 2018-04-01: 30 mg via INTRAMUSCULAR
  Filled 2018-04-01: qty 2

## 2018-04-01 NOTE — ED Triage Notes (Signed)
Pt verbalizes recent cough; denies today; new onset left/central chest pain with nausea onset 25 minutes ago.

## 2018-04-01 NOTE — Discharge Instructions (Signed)
Please return for any problem. Follow up with your regular physician as instructed.  °
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
## 2018-04-15 ENCOUNTER — Encounter: Payer: Self-pay | Admitting: Internal Medicine

## 2018-04-22 ENCOUNTER — Ambulatory Visit: Payer: Medicare Other | Admitting: Pulmonary Disease

## 2018-04-22 ENCOUNTER — Encounter: Payer: Self-pay | Admitting: Pulmonary Disease

## 2018-04-22 ENCOUNTER — Institutional Professional Consult (permissible substitution): Payer: Medicare Other | Admitting: Pulmonary Disease

## 2018-04-22 ENCOUNTER — Ambulatory Visit
Admission: RE | Admit: 2018-04-22 | Discharge: 2018-04-22 | Disposition: A | Discharge status: Home / Self Care | Payer: Medicare Other | Source: Ambulatory Visit | Attending: Internal Medicine | Admitting: Internal Medicine

## 2018-04-22 VITALS — BP 126/84 | HR 89 | Ht 61.22 in | Wt 180.0 lb

## 2018-04-22 DIAGNOSIS — J45909 Unspecified asthma, uncomplicated: Secondary | ICD-10-CM

## 2018-04-22 DIAGNOSIS — M797 Fibromyalgia: Secondary | ICD-10-CM | POA: Diagnosis not present

## 2018-04-22 DIAGNOSIS — J4541 Moderate persistent asthma with (acute) exacerbation: Secondary | ICD-10-CM

## 2018-04-22 DIAGNOSIS — E2839 Other primary ovarian failure: Secondary | ICD-10-CM

## 2018-04-22 LAB — NITRIC OXIDE: NITRIC OXIDE: 20

## 2018-04-22 MED ORDER — BUDESONIDE-FORMOTEROL FUMARATE 160-4.5 MCG/ACT IN AERO: 2.0000 | INHALATION_SPRAY | Freq: Two times a day (BID) | RESPIRATORY_TRACT | 0 refills | Status: DC

## 2018-04-22 NOTE — Assessment & Plan Note (Addendum)
Lung function appears decreased with moderate airway obs,  we will treat as exacerbation Trial of Symbicort 160 -2 puffs twice daily, rinse mouth after use Dc advair  Stay on Singulair once daily    Prednisone 10 mg tabs  Take 2 tabs daily with food x 5ds, then 1 tab daily with food x 5ds then STOP

## 2018-04-22 NOTE — Patient Instructions (Signed)
Lung function appears decreased. Trial of Symbicort 160 -2 puffs twice daily, rinse mouth after use  Stay on Singulair once daily    Prednisone 10 mg tabs  Take 2 tabs daily with food x 5ds, then 1 tab daily with food x 5ds then STOP
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
## 2018-04-23 ENCOUNTER — Telehealth: Payer: Self-pay | Admitting: Pulmonary Disease

## 2018-04-23 ENCOUNTER — Telehealth: Payer: Self-pay | Admitting: Family Medicine

## 2018-04-23 MED ORDER — PREDNISONE 10 MG PO TABS: ORAL_TABLET | ORAL | 0 refills | Status: DC

## 2018-04-23 NOTE — Telephone Encounter (Deleted)
Copied from [place] [id number]. Topic: Quick Communication - See Telephone Encounter >> Apr 23, 2018 11:21 AM Ivar Drape wrote: CRM for notification. See Telephone encounter for: 04/23/18.

## 2018-04-23 NOTE — Telephone Encounter (Signed)
Called patient, unable to reach. Left message to give [place] a call back. I do not see where this medication was sent in yesterday. I have sent this to the patients pharmacy.

## 2018-04-23 NOTE — Assessment & Plan Note (Signed)
Fatigue may be related  Also may need to exclude OSA as cause, she reports a diagnosis in the past  -may have to reassess in the light of weight gain

## 2018-04-27 ENCOUNTER — Ambulatory Visit: Payer: Medicare Other | Admitting: Family Medicine

## 2018-05-06 ENCOUNTER — Telehealth: Payer: Self-pay | Admitting: Pulmonary Disease

## 2018-05-06 NOTE — Telephone Encounter (Signed)
Pharm [facility] on [address].

## 2018-05-06 NOTE — Telephone Encounter (Signed)
I believe that she should hold off on any further prednisone since her breathing has improved. If her pain in her chest persists then we may need to consider other causes, other testing to figure it out. Please have her call us if the pain is not gone by Friday.

## 2018-05-06 NOTE — Telephone Encounter (Signed)
Called and spoke to pt.  Pt stated she was advised to f/u with RA regarding symptoms   Pt stated her breathing improved with prednisone, however she is still experiencing discomfort behind her breastbone, increased fatigue & congestion in throat. Completed prednisone taper on 05/04/18. Pt is questioning if prednisone taper should be extended.   RB please advise, as RA is unavailable. Thanks

## 2018-05-06 NOTE — Telephone Encounter (Signed)
Called and spoke with patient regarding RB recommendations listed below.  Pt advised that she will call Friday morning if chest pain persists.  Pt verbalized understanding and denied any questions or concerns at this time.  Nothing further needed.

## 2018-05-11 LAB — LIPID PANEL
CHOLESTEROL: 168 (ref 0–200)
HDL: 32 — AB (ref 35–70)
LDL CALC: 89
LDL/HDL RATIO: 2.8
TRIGLYCERIDES: 235 — AB (ref 40–160)

## 2018-05-11 LAB — HEPATIC FUNCTION PANEL
ALT: 16 (ref 7–35)
AST: 14 (ref 13–35)
Alkaline Phosphatase: 84 (ref 25–125)
BILIRUBIN, TOTAL: 0.1

## 2018-05-11 LAB — BASIC METABOLIC PANEL
BUN: 10 (ref 4–21)
CREATININE: 0.8 (ref 0.5–1.1)
Glucose: 140
Potassium: 4.6 (ref 3.4–5.3)
Sodium: 139 (ref 137–147)

## 2018-05-11 LAB — HEMOGLOBIN A1C: HEMOGLOBIN A1C: 8.3 % — AB (ref 4.0–5.6)

## 2018-05-24 ENCOUNTER — Encounter (HOSPITAL_COMMUNITY): Payer: Self-pay | Admitting: Emergency Medicine

## 2018-05-24 ENCOUNTER — Emergency Department (HOSPITAL_COMMUNITY): Payer: Medicare Other

## 2018-05-24 ENCOUNTER — Emergency Department (HOSPITAL_COMMUNITY)
Admission: EM | Admit: 2018-05-24 | Discharge: 2018-05-24 | Disposition: A | Discharge status: Home / Self Care | Payer: Medicare Other | Attending: Emergency Medicine | Admitting: Emergency Medicine

## 2018-05-24 DIAGNOSIS — Z79899 Other long term (current) drug therapy: Secondary | ICD-10-CM | POA: Insufficient documentation

## 2018-05-24 DIAGNOSIS — E119 Type 2 diabetes mellitus without complications: Secondary | ICD-10-CM | POA: Diagnosis not present

## 2018-05-24 DIAGNOSIS — R42 Dizziness and giddiness: Secondary | ICD-10-CM | POA: Insufficient documentation

## 2018-05-24 DIAGNOSIS — J45909 Unspecified asthma, uncomplicated: Secondary | ICD-10-CM | POA: Insufficient documentation

## 2018-05-24 DIAGNOSIS — R200 Anesthesia of skin: Secondary | ICD-10-CM | POA: Diagnosis present

## 2018-05-24 DIAGNOSIS — Z87891 Personal history of nicotine dependence: Secondary | ICD-10-CM | POA: Insufficient documentation

## 2018-05-24 DIAGNOSIS — R202 Paresthesia of skin: Secondary | ICD-10-CM | POA: Diagnosis not present

## 2018-05-24 DIAGNOSIS — Z7984 Long term (current) use of oral hypoglycemic drugs: Secondary | ICD-10-CM | POA: Diagnosis not present

## 2018-05-24 DIAGNOSIS — I10 Essential (primary) hypertension: Secondary | ICD-10-CM | POA: Insufficient documentation

## 2018-05-24 LAB — CBG MONITORING, ED: Glucose-Capillary: 160 mg/dL — ABNORMAL HIGH (ref 70–99)

## 2018-05-24 LAB — CBC
HCT: 37.1 % (ref 36.0–46.0)
Hemoglobin: 12.4 g/dL (ref 12.0–15.0)
MCH: 26.2 pg (ref 26.0–34.0)
MCHC: 33.4 g/dL (ref 30.0–36.0)
MCV: 78.4 fL (ref 78.0–100.0)
PLATELETS: 300 10*3/uL (ref 150–400)
RBC: 4.73 MIL/uL (ref 3.87–5.11)
RDW: 14 % (ref 11.5–15.5)
WBC: 5.8 10*3/uL (ref 4.0–10.5)

## 2018-05-24 LAB — URINALYSIS, ROUTINE W REFLEX MICROSCOPIC
BILIRUBIN URINE: NEGATIVE
GLUCOSE, UA: 150 mg/dL — AB
HGB URINE DIPSTICK: NEGATIVE
Ketones, ur: NEGATIVE mg/dL
Leukocytes, UA: NEGATIVE
NITRITE: NEGATIVE
Protein, ur: NEGATIVE mg/dL
Specific Gravity, Urine: 1.023 (ref 1.005–1.030)
pH: 6 (ref 5.0–8.0)

## 2018-05-24 LAB — BASIC METABOLIC PANEL
ANION GAP: 10 (ref 5–15)
BUN: 10 mg/dL (ref 8–23)
CALCIUM: 9.1 mg/dL (ref 8.9–10.3)
CO2: 26 mmol/L (ref 22–32)
CREATININE: 0.61 mg/dL (ref 0.44–1.00)
Chloride: 108 mmol/L (ref 98–111)
Glucose, Bld: 177 mg/dL — ABNORMAL HIGH (ref 70–99)
Potassium: 3.6 mmol/L (ref 3.5–5.1)
Sodium: 144 mmol/L (ref 135–145)

## 2018-05-24 NOTE — Discharge Instructions (Addendum)
There were no serious causes found to explain the numbness.  The MRI did not show a stroke, or brain tumor that would be concerning as a cause for numbness.  Sure that you are getting plenty of rest, drinking a lot of fluids and eating 3 meals a day.  Call the neurology office for a follow-up appointment for further evaluation and treatment.

## 2018-05-24 NOTE — ED Notes (Signed)
Patient transported to MRI 
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
## 2018-05-24 NOTE — ED Triage Notes (Addendum)
Patient here from home with complaints of dizziness and left sided facial numbness that started last Thursday. Reports that she called the nurse hotline and was advised to come here. Also reports sudden problems with memory.

## 2018-05-29 DIAGNOSIS — R42 Dizziness and giddiness: Secondary | ICD-10-CM

## 2018-05-29 DIAGNOSIS — R413 Other amnesia: Secondary | ICD-10-CM | POA: Insufficient documentation

## 2018-05-29 HISTORY — DX: Dizziness and giddiness: R42

## 2018-06-22 ENCOUNTER — Encounter: Payer: Self-pay | Admitting: Neurology

## 2018-06-22 ENCOUNTER — Telehealth: Payer: Self-pay | Admitting: Neurology

## 2018-06-22 ENCOUNTER — Ambulatory Visit: Payer: Medicare Other | Admitting: Neurology

## 2018-06-22 VITALS — BP 150/80 | HR 88 | Ht 61.0 in | Wt 182.0 lb

## 2018-06-22 DIAGNOSIS — I951 Orthostatic hypotension: Secondary | ICD-10-CM | POA: Diagnosis not present

## 2018-06-22 DIAGNOSIS — H81399 Other peripheral vertigo, unspecified ear: Secondary | ICD-10-CM

## 2018-06-22 DIAGNOSIS — R42 Dizziness and giddiness: Secondary | ICD-10-CM

## 2018-06-22 NOTE — Telephone Encounter (Signed)
[facility] Medicare order sent to GI. No auth they will reach out to to the pt to schedule.
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
## 2018-06-22 NOTE — Patient Instructions (Addendum)
Please try to hydrate well with water. Your blood pressure dropped more than 30 points for the top number, which can cause symptoms of dizziness.  Please talk to your primary care about doing physical therapy, as it helped you in the past.  Your neurological exam is non focal. No signs of parkinsonism.  For completion, We will do a brain scan, called MRI with and without contrast, and call you with the test results. We will have to schedule you for this on a separate date. This test requires authorization from your insurance, and we will take care of the insurance process. So long as your MRI shows age-appropriate findings, I will see you back as needed.

## 2018-06-29 ENCOUNTER — Telehealth: Payer: Self-pay | Admitting: Neurology

## 2018-06-29 NOTE — Telephone Encounter (Signed)
Patient is scheduled for oral surgery on 07-01-18. She received a call from [place] to schedule an MRI. How long should she wait after her oral surgery before she can have an MRI?

## 2018-06-30 NOTE — Telephone Encounter (Signed)
I called pt and advised her of Dr. Guadelupe Sabin recommendations regarding her MRI and oral surgery. Pt will discuss this further with her surgeon tomorrow. Pt verbalized understanding of the above recommendations.

## 2018-06-30 NOTE — Telephone Encounter (Signed)
Oral surgery should not have any bearing on MRI, can double check with her surgeon and anesthesiologist.

## 2018-06-30 NOTE — Telephone Encounter (Signed)
I called pt. She will have an injured tooth removed tomorrow and will be sedated via IV medications. She is wondering how long she should wait after this surgery tomorrow before she should have her MRI?

## 2018-07-09 ENCOUNTER — Ambulatory Visit
Admission: RE | Admit: 2018-07-09 | Discharge: 2018-07-09 | Disposition: A | Discharge status: Home / Self Care | Payer: Medicare Other | Source: Ambulatory Visit | Attending: Neurology | Admitting: Neurology

## 2018-07-09 DIAGNOSIS — R42 Dizziness and giddiness: Secondary | ICD-10-CM

## 2018-07-09 DIAGNOSIS — I951 Orthostatic hypotension: Secondary | ICD-10-CM

## 2018-07-09 DIAGNOSIS — H81399 Other peripheral vertigo, unspecified ear: Secondary | ICD-10-CM

## 2018-07-09 MED ORDER — GADOBENATE DIMEGLUMINE 529 MG/ML IV SOLN
17.0000 mL | Freq: Once | INTRAVENOUS | Status: AC | PRN
  Administered 2018-07-09: 12 mL via INTRAVENOUS

## 2018-07-10 ENCOUNTER — Telehealth: Payer: Self-pay | Admitting: *Deleted

## 2018-07-10 NOTE — Telephone Encounter (Signed)
-----   Message from [name], MD sent at 07/10/2018 11:05 AM EDT ----- MRI brain shows reassuring and age-appropriate findings. Please update pt. She is encouraged to FU with PCP.  Michel Bickers

## 2018-07-10 NOTE — Telephone Encounter (Signed)
Called and spoke with patient about MRI results per Dr. Rexene Alberts note. Pt verbalized understanding and appreciation.

## 2018-07-10 NOTE — Progress Notes (Signed)
MRI brain shows reassuring and age-appropriate findings. Please update pt. She is encouraged to FU with PCP.  Michel Bickers

## 2018-08-07 ENCOUNTER — Ambulatory Visit: Payer: Medicare Other | Admitting: Pulmonary Disease

## 2018-08-09 ENCOUNTER — Encounter: Payer: Self-pay | Admitting: Internal Medicine

## 2018-08-09 DIAGNOSIS — R42 Dizziness and giddiness: Secondary | ICD-10-CM

## 2018-08-09 DIAGNOSIS — R413 Other amnesia: Secondary | ICD-10-CM

## 2018-08-10 DIAGNOSIS — R42 Dizziness and giddiness: Secondary | ICD-10-CM

## 2018-08-10 DIAGNOSIS — R102 Pelvic and perineal pain: Secondary | ICD-10-CM | POA: Insufficient documentation

## 2018-08-10 DIAGNOSIS — R109 Unspecified abdominal pain: Secondary | ICD-10-CM | POA: Insufficient documentation

## 2018-08-10 DIAGNOSIS — D259 Leiomyoma of uterus, unspecified: Secondary | ICD-10-CM | POA: Insufficient documentation

## 2018-08-10 DIAGNOSIS — N3946 Mixed incontinence: Secondary | ICD-10-CM | POA: Insufficient documentation

## 2018-08-10 DIAGNOSIS — R0789 Other chest pain: Secondary | ICD-10-CM

## 2018-08-10 DIAGNOSIS — H9190 Unspecified hearing loss, unspecified ear: Secondary | ICD-10-CM | POA: Insufficient documentation

## 2018-08-10 DIAGNOSIS — G479 Sleep disorder, unspecified: Secondary | ICD-10-CM | POA: Insufficient documentation

## 2018-08-10 DIAGNOSIS — G4733 Obstructive sleep apnea (adult) (pediatric): Secondary | ICD-10-CM | POA: Insufficient documentation

## 2018-08-10 DIAGNOSIS — N3281 Overactive bladder: Secondary | ICD-10-CM | POA: Insufficient documentation

## 2018-08-10 DIAGNOSIS — J4540 Moderate persistent asthma, uncomplicated: Secondary | ICD-10-CM | POA: Insufficient documentation

## 2018-08-10 HISTORY — DX: Dizziness and giddiness: R42

## 2018-08-10 HISTORY — DX: Pelvic and perineal pain: R10.2

## 2018-08-10 HISTORY — DX: Other chest pain: R07.89

## 2018-08-10 HISTORY — DX: Unspecified abdominal pain: R10.9

## 2018-08-11 ENCOUNTER — Ambulatory Visit: Payer: Medicare Other | Admitting: Pulmonary Disease

## 2018-08-11 ENCOUNTER — Encounter: Payer: Self-pay | Admitting: Pulmonary Disease

## 2018-08-11 DIAGNOSIS — J4541 Moderate persistent asthma with (acute) exacerbation: Secondary | ICD-10-CM

## 2018-08-11 DIAGNOSIS — J4540 Moderate persistent asthma, uncomplicated: Secondary | ICD-10-CM

## 2018-08-11 MED ORDER — PREDNISONE 10 MG PO TABS: ORAL_TABLET | ORAL | 0 refills | Status: DC

## 2018-08-11 MED ORDER — ALBUTEROL SULFATE (2.5 MG/3ML) 0.083% IN NEBU: 2.5000 mg | INHALATION_SOLUTION | Freq: Three times a day (TID) | RESPIRATORY_TRACT | 5 refills | Status: DC | PRN

## 2018-08-11 MED ORDER — AZITHROMYCIN 250 MG PO TABS: ORAL_TABLET | ORAL | 0 refills | Status: DC

## 2018-08-11 MED ORDER — BUDESONIDE-FORMOTEROL FUMARATE 160-4.5 MCG/ACT IN AERO: 2.0000 | INHALATION_SPRAY | Freq: Two times a day (BID) | RESPIRATORY_TRACT | 3 refills | Status: DC

## 2018-08-11 NOTE — Progress Notes (Signed)
   Subjective:    Patient ID: Pam [name], female    DOB: 1951-08-15, 67 y.o.   MRN: [id number]  HPI  67 yo never smoker, diabetic for FU of asthma & fatigue She has fibromyalgia for > 10 yrs. 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 She is on xyzal, claritin - allergy shots deferred.  Chief Complaint  Patient presents with  . Follow-up    States she went out of town and was around a dog, since then she has been SOB, chest tight, hoarse voice, has not taken symbicort. some throat irritation, cough with yellow mucous. States the coughing spells make her feel light headed.    She had symptoms ongoing since 02/2018, on her last visit with [place] was given low-dose prednisone and symptoms improved.  Stayed in remission for a month until she visited her niece who had her dog and stayed with him and came back with a cough and wheezing. She now reports productive cough with yellow sputum, used her albuterol neb last night. Was not taking Symbicort maintenance inhaler is prescribed and feels like she had a run out of her albuterol MDI. She has been taking her Singulair.  She is contemplating moving to [facility] due to the humid weather and frequent exacerbations.   Significant tests/ events reviewed  04/2011 spiro >> FEV 1 72%, FVC 115%, 11 % improved with BD  Spirometry 04/2018 >> ratio 60, FEV1 54%, FVC 71 %  CT chest 11/2016 reactive mediastinal LNs  Allergy testing POS forgrasses, weeds, trees, dust mites, cat, dog, horse, cockroach.   FENO 04/2018 20  Review of Systems neg for any significant sore throat, dysphagia, itching, sneezing, nasal congestion or excess/ purulent secretions, fever, chills, sweats, unintended wt loss, pleuritic or exertional cp, hempoptysis, orthopnea pnd or change in chronic leg swelling. Also denies presyncope,  palpitations, heartburn, abdominal pain, nausea, vomiting, diarrhea or change in bowel or urinary habits, dysuria,hematuria, rash, arthralgias, visual complaints, headache, numbness weakness or ataxia.     Objective:   Physical Exam   Gen. Pleasant, well-nourished, in no distress ENT - no thrush, no post nasal drip Neck: No JVD, no thyromegaly, no carotid bruits Lungs: no use of accessory muscles, no dullness to percussion, no rales faint exp rhonchi  Cardiovascular: Rhythm regular, heart sounds  normal, no murmurs or gallops, no peripheral edema Musculoskeletal: No deformities, no cyanosis or clubbing         Assessment & Plan:

## 2018-08-11 NOTE — Patient Instructions (Signed)
Z-Pak Prednisone 10 mg tabs  Take 2 tabs daily with food x 5ds, then 1 tab daily with food x 5ds then STOP  Refills on Symbicort 160 and albuterol Stay on Singulair Symbicort as your maintenance medication, take this twice every day

## 2018-08-11 NOTE — Assessment & Plan Note (Signed)
  Refills on Symbicort 160 and albuterol Stay on Singulair Symbicort as your maintenance medication, take this twice every day

## 2018-08-11 NOTE — Assessment & Plan Note (Signed)
Z-Pak Prednisone 10 mg tabs  Take 2 tabs daily with food x 5ds, then 1 tab daily with food x 5ds then STOP  

## 2018-08-13 ENCOUNTER — Encounter: Payer: Self-pay | Admitting: Internal Medicine

## 2018-08-13 ENCOUNTER — Ambulatory Visit: Payer: Medicare Other | Admitting: Internal Medicine

## 2018-08-13 VITALS — BP 162/84 | HR 81 | Temp 98.6°F | Ht 61.0 in | Wt 178.0 lb

## 2018-08-13 DIAGNOSIS — E1165 Type 2 diabetes mellitus with hyperglycemia: Secondary | ICD-10-CM | POA: Diagnosis not present

## 2018-08-13 DIAGNOSIS — J4541 Moderate persistent asthma with (acute) exacerbation: Secondary | ICD-10-CM

## 2018-08-13 DIAGNOSIS — M797 Fibromyalgia: Secondary | ICD-10-CM | POA: Diagnosis not present

## 2018-08-13 DIAGNOSIS — I10 Essential (primary) hypertension: Secondary | ICD-10-CM | POA: Diagnosis not present

## 2018-08-13 MED ORDER — IPRATROPIUM-ALBUTEROL 0.5-2.5 (3) MG/3ML IN SOLN: 3.0000 mL | Freq: Once | RESPIRATORY_TRACT | Status: DC

## 2018-08-14 LAB — CMP14+EGFR
A/G RATIO: 1.3 (ref 1.2–2.2)
ALBUMIN: 4.3 g/dL (ref 3.6–4.8)
ALT: 14 IU/L (ref 0–32)
AST: 12 IU/L (ref 0–40)
Alkaline Phosphatase: 89 IU/L (ref 39–117)
BUN / CREAT RATIO: 13 (ref 12–28)
BUN: 9 mg/dL (ref 8–27)
Bilirubin Total: <0.2 mg/dL (ref 0.0–1.2)
CHLORIDE: 100 mmol/L (ref 96–106)
CO2: 21 mmol/L (ref 20–29)
Calcium: 9.6 mg/dL (ref 8.7–10.3)
Creatinine, Ser: 0.72 mg/dL (ref 0.57–1.00)
GFR, EST AFRICAN AMERICAN: 100 mL/min/{1.73_m2} (ref >59)
GFR, EST NON AFRICAN AMERICAN: 86 mL/min/{1.73_m2} (ref >59)
GLOBULIN, TOTAL: 3.3 g/dL (ref 1.5–4.5)
Glucose: 261 mg/dL — ABNORMAL HIGH (ref 65–99)
POTASSIUM: 4.2 mmol/L (ref 3.5–5.2)
Sodium: 138 mmol/L (ref 134–144)
TOTAL PROTEIN: 7.6 g/dL (ref 6.0–8.5)

## 2018-08-14 LAB — HEMOGLOBIN A1C
Est. average glucose Bld gHb Est-mCnc: 192 mg/dL
Hgb A1c MFr Bld: 8.3 % — ABNORMAL HIGH (ref 4.8–5.6)

## 2018-08-16 ENCOUNTER — Encounter: Payer: Self-pay | Admitting: Internal Medicine
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
## 2018-08-17 NOTE — Progress Notes (Signed)
Here are your results:  Your liver and kidney function are normal. Your sugar was quite high, 261. The prednisone of course contributes to your elevated blood sugars. Your hba1c is 8.3, our goal is less than 7.8.  I expect your sugars to gradually come down.   How are you feeling now?   Sincerely,    Terrilyn Tyner N. Baird [facility], MD

## 2018-08-21 ENCOUNTER — Telehealth: Payer: Self-pay | Admitting: Pulmonary Disease

## 2018-08-21 MED ORDER — DOXYCYCLINE HYCLATE 100 MG PO TABS: 100.0000 mg | ORAL_TABLET | Freq: Two times a day (BID) | ORAL | 0 refills | Status: DC

## 2018-08-21 NOTE — Telephone Encounter (Signed)
Called and spoke with patient.  She stated that she thinks she is doing better then her 10/1 appointment with Dr. Elsworth Soho, but she is still hoarse, and has a productive cough, with yellow phlegm. She has finished all her antibiotic and has continued recommendations from [place].  She declined a OV today with a NP.   Dr. Elsworth Soho, please advise

## 2018-08-21 NOTE — Telephone Encounter (Signed)
Called and spoke with pt to let her know we were sending Rx of doxy to her pharmacy.  Asked pt if she was still wheezing and per pt, she is no longer wheezing but is having tightness in her chest.  RA, please advise if you still want to hold off of the pred Rx since pt is not wheezing and just to send the Rx doxy.  Thanks!

## 2018-08-21 NOTE — Telephone Encounter (Signed)
Doxycycline 100 mg daily for 7 days. If still wheezing, repeat course of prednisone Prednisone 10 mg tabs  Take 2 tabs daily with food x 5ds, then 1 tab daily with food x 5ds then STOP  If no wheezing, does not need more prednisone

## 2018-08-21 NOTE — Telephone Encounter (Signed)
Rx for doxy has been sent to pt's preferred pharmacy.

## 2018-09-02 ENCOUNTER — Other Ambulatory Visit: Payer: Self-pay | Admitting: Internal Medicine

## 2018-09-02 DIAGNOSIS — R7309 Other abnormal glucose: Secondary | ICD-10-CM

## 2018-09-24 ENCOUNTER — Ambulatory Visit: Payer: Medicare Other | Admitting: Internal Medicine

## 2018-09-24 ENCOUNTER — Other Ambulatory Visit: Payer: Self-pay | Admitting: Internal Medicine

## 2018-09-24 VITALS — BP 126/70 | HR 85 | Temp 98.2°F | Ht 61.0 in | Wt 180.0 lb

## 2018-09-24 DIAGNOSIS — M26629 Arthralgia of temporomandibular joint, unspecified side: Secondary | ICD-10-CM

## 2018-09-24 DIAGNOSIS — E66811 Obesity, class 1: Secondary | ICD-10-CM

## 2018-09-24 DIAGNOSIS — M3320 Polymyositis, organ involvement unspecified: Secondary | ICD-10-CM | POA: Diagnosis not present

## 2018-09-24 DIAGNOSIS — M792 Neuralgia and neuritis, unspecified: Secondary | ICD-10-CM

## 2018-09-24 DIAGNOSIS — E1165 Type 2 diabetes mellitus with hyperglycemia: Secondary | ICD-10-CM

## 2018-09-24 DIAGNOSIS — E6609 Other obesity due to excess calories: Secondary | ICD-10-CM | POA: Diagnosis not present

## 2018-09-24 DIAGNOSIS — Z6834 Body mass index (BMI) 34.0-34.9, adult: Secondary | ICD-10-CM

## 2018-09-24 NOTE — Patient Instructions (Signed)

## 2018-09-26 ENCOUNTER — Other Ambulatory Visit: Payer: Self-pay | Admitting: Internal Medicine

## 2018-09-26 DIAGNOSIS — R7309 Other abnormal glucose: Secondary | ICD-10-CM

## 2018-09-27 ENCOUNTER — Encounter: Payer: Self-pay | Admitting: Internal Medicine
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
## 2018-09-29 NOTE — Addendum Note (Signed)
Addended by: Maximino Greenland on: 09/29/2018 02:33 PM   Modules accepted: Orders

## 2018-10-12 ENCOUNTER — Ambulatory Visit: Payer: Medicare Other | Admitting: Adult Health

## 2018-10-12 ENCOUNTER — Encounter: Payer: Self-pay | Admitting: Adult Health

## 2018-10-12 VITALS — BP 118/76 | HR 92 | Ht 61.0 in | Wt 181.4 lb

## 2018-10-12 DIAGNOSIS — J45909 Unspecified asthma, uncomplicated: Secondary | ICD-10-CM

## 2018-10-12 DIAGNOSIS — J4541 Moderate persistent asthma with (acute) exacerbation: Secondary | ICD-10-CM | POA: Diagnosis not present

## 2018-10-12 NOTE — Assessment & Plan Note (Signed)
Improved control of Asthma with significant improvement in lung function on Spriometry c/w asthma.   Will continue current regimen . Once she is moved , if doing well in new enviroment could consider deescalating therapy .  She wants to see [place] one last time before her move.   Plan  Patient Instructions  Continue on Symbicort 2 puffs Twice daily  , brush/rinse and gargle after use.  Continue on Xyzal daily  Continue on Singulair daily .  Continue on Flonase 2 puffs daily .  Follow up in 4 weeks prior to your move and As needed
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
## 2018-10-12 NOTE — Patient Instructions (Addendum)
Continue on Symbicort 2 puffs Twice daily  , brush/rinse and gargle after use.  Continue on Xyzal daily  Continue on Singulair daily .  Continue on Flonase 2 puffs daily .  Follow up in 4 weeks prior to your move with Dr. Elsworth Soho  Or [place] NP and As needed

## 2018-10-13 LAB — HM DIABETES EYE EXAM

## 2018-10-20 ENCOUNTER — Other Ambulatory Visit: Payer: Self-pay | Admitting: Family Medicine

## 2018-10-20 ENCOUNTER — Ambulatory Visit: Payer: Medicare Other | Admitting: Internal Medicine

## 2018-10-20 ENCOUNTER — Encounter: Payer: Self-pay | Admitting: Internal Medicine

## 2018-10-20 VITALS — BP 136/72 | HR 93 | Temp 98.7°F | Ht 61.0 in | Wt 181.0 lb

## 2018-10-20 DIAGNOSIS — E6609 Other obesity due to excess calories: Secondary | ICD-10-CM | POA: Diagnosis not present

## 2018-10-20 DIAGNOSIS — M792 Neuralgia and neuritis, unspecified: Secondary | ICD-10-CM | POA: Diagnosis not present

## 2018-10-20 DIAGNOSIS — D582 Other hemoglobinopathies: Secondary | ICD-10-CM | POA: Diagnosis not present

## 2018-10-20 DIAGNOSIS — Z6834 Body mass index (BMI) 34.0-34.9, adult: Secondary | ICD-10-CM

## 2018-10-20 DIAGNOSIS — J4541 Moderate persistent asthma with (acute) exacerbation: Secondary | ICD-10-CM

## 2018-10-20 DIAGNOSIS — E66811 Obesity, class 1: Secondary | ICD-10-CM

## 2018-10-20 DIAGNOSIS — E1165 Type 2 diabetes mellitus with hyperglycemia: Secondary | ICD-10-CM

## 2018-10-20 HISTORY — DX: Other hemoglobinopathies: D58.2

## 2018-10-20 LAB — HEMOGLOBIN A1C
Est. average glucose Bld gHb Est-mCnc: 192 mg/dL
HEMOGLOBIN A1C: 8.3 % — AB (ref 4.8–5.6)

## 2018-10-20 NOTE — Patient Instructions (Signed)
Trigeminal Neuralgia Trigeminal neuralgia is a nerve disorder that causes attacks of severe facial pain. The attacks last from a few seconds to several minutes. They can happen for days, weeks, or months and then go away for months or years. Trigeminal neuralgia is also called tic douloureux. What are the causes? This condition is caused by damage to a nerve in the face that is called the trigeminal nerve. An attack can be triggered by:  Talking.  Chewing.  Putting on makeup.  Washing your face.  Shaving your face.  Brushing your teeth.  Touching your face.  What increases the risk? This condition is more likely to develop in:  Women.  People who are 50 years of age or older.  What are the signs or symptoms? The main symptom of this condition is pain in the jaw, lips, eyes, nose, scalp, forehead, and face. The pain may be intense, stabbing, electric, or shock-like. How is this diagnosed? This condition is diagnosed with a physical exam. A CT scan or MRI may be done to rule out other conditions that can cause facial pain. How is this treated? This condition may be treated with:  Avoiding the things that trigger your attacks.  Pain medicine.  Surgery. This may be done in severe cases if other medical treatment does not provide relief.  Follow these instructions at home:  Take over-the-counter and prescription medicines only as told by your health care provider.  If you wish to get pregnant, talk with your health care provider before you start trying to get pregnant.  Avoid the things that trigger your attacks. It may help to: ? Chew on the unaffected side of your mouth. ? Avoid touching your face. ? Avoid blasts of hot or cold air. Contact a health care provider if:  Your pain medicine is not helping.  You develop new, unexplained symptoms, such as: ? Double vision. ? Facial weakness. ? Changes in hearing or balance.  You become pregnant. Get help right away  if:  Your pain is unbearable, and your pain medicine does not help. This information is not intended to replace advice given to you by your health care provider. Make sure you discuss any questions you have with your health care provider. Document Released: 10/25/2000 Document Revised: 06/30/2016 Document Reviewed: 02/20/2015 Elsevier Interactive Patient Education  2018 [employer]  

## 2018-10-21 ENCOUNTER — Other Ambulatory Visit: Payer: Self-pay | Admitting: Family Medicine

## 2018-10-21 DIAGNOSIS — J4541 Moderate persistent asthma with (acute) exacerbation: Secondary | ICD-10-CM

## 2018-10-21 NOTE — Telephone Encounter (Signed)
Requested medication (s) are due for refill today: Yes  Requested medication (s) are on the active medication list: Yes  Last refill:  10/16/17  Future visit scheduled: Yes  Notes to clinic:  Prescription has expired.Thanks.    Requested Prescriptions  Pending Prescriptions Disp Refills   FLOVENT HFA 110 MCG/ACT inhaler [Pharmacy Med Name: [facility] HFA 110 MCG INHALER] 12 Inhaler 5    Sig: TAKE 2 PUFFS BY MOUTH TWICE A DAY     Pulmonology:  Corticosteroids Passed - 10/20/2018 12:46 AM      Passed - Valid encounter within last 12 months    Recent Outpatient Visits          6 months ago Ingrown toenail of left foot   Primary Care at [facility], Zoe A, MD   7 months ago Moderate persistent asthma with acute exacerbation   Primary Care at [facility], [place] A, MD   7 months ago Moderate persistent asthma with acute exacerbation   Primary Care at [facility], [place] A, MD   1 year ago Moderate persistent asthma with acute exacerbation   Primary Care at [facility], Arlie Solomons, MD   1 year ago Skin pain   Primary Care at [place], Dorian Heckle, [place]      Future Appointments            In 1 month Glendale Chard, MD [facility]   In 4 months  [facility]   In 4 months [facility], Sandrea Matte [facility]

## 2018-10-22 NOTE — Progress Notes (Signed)
Your hba1c is 8.3, same as two months ago. Our goal is less than 7.9. Pls increase activity. Chair exercises do count! Happy holidays!  Sincerely,    Jolene Guyett N. Baird [facility], MD

## 2018-10-22 NOTE — Telephone Encounter (Signed)
Requested medication (s) are due for refill today: yes  Requested medication (s) are on the active medication list: yes- historical med  Last refill:  10/16/17  Future visit scheduled: no  Notes to clinic:  Historical med   Requested Prescriptions  Pending Prescriptions Disp Refills   montelukast (SINGULAIR) 10 MG tablet [Pharmacy Med Name: MONTELUKAST SOD 10 MG TABLET] 90 tablet 3    Sig: TAKE 1 TABLET BY MOUTH EVERYDAY AT BEDTIME     Pulmonology:  Leukotriene Inhibitors Passed - 10/21/2018  2:18 AM      Passed - Valid encounter within last 12 months    Recent Outpatient Visits          7 months ago Ingrown toenail of left foot   Primary Care at [facility], Zoe A, MD   7 months ago Moderate persistent asthma with acute exacerbation   Primary Care at [facility], [place] A, MD   7 months ago Moderate persistent asthma with acute exacerbation   Primary Care at [facility], Arlie Solomons, MD   1 year ago Moderate persistent asthma with acute exacerbation   Primary Care at [facility], Arlie Solomons, MD   1 year ago Skin pain   Primary Care at [place], Dorian Heckle, [place]      Future Appointments            In 1 month Glendale Chard, MD [facility]   In 4 months  [facility]   In 4 months [facility], Sandrea Matte [facility]

## 2018-10-23 ENCOUNTER — Encounter: Payer: Self-pay | Admitting: Internal Medicine

## 2018-10-23 NOTE — Telephone Encounter (Signed)
LVM for pt to call the office and schedule an appt to obtain refills. When pt calls back, please schedule at her convenience. Thank you!

## 2018-10-23 NOTE — Telephone Encounter (Signed)
No further refill without office visit °

## 2018-10-25 ENCOUNTER — Encounter: Payer: Self-pay | Admitting: Internal Medicine
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
## 2018-11-06 ENCOUNTER — Encounter: Payer: Self-pay | Admitting: Internal Medicine

## 2018-11-12 ENCOUNTER — Other Ambulatory Visit: Payer: Self-pay

## 2018-11-12 MED ORDER — SEMAGLUTIDE (1 MG/DOSE) 2 MG/1.5ML ~~LOC~~ SOPN: 1.0000 mg | PEN_INJECTOR | SUBCUTANEOUS | 2 refills | Status: DC

## 2018-11-19 ENCOUNTER — Other Ambulatory Visit: Payer: Self-pay | Admitting: Family Medicine

## 2018-11-19 DIAGNOSIS — J4541 Moderate persistent asthma with (acute) exacerbation: Secondary | ICD-10-CM

## 2018-11-19 NOTE — Telephone Encounter (Signed)
Requested Prescriptions  Pending Prescriptions Disp Refills  . montelukast (SINGULAIR) 10 MG tablet [Pharmacy Med Name: MONTELUKAST SOD 10 MG TABLET] 30 tablet 3    Sig: TAKE 1 TABLET BY MOUTH EVERYDAY AT BEDTIME     Pulmonology:  Leukotriene Inhibitors Passed - 11/19/2018  1:33 PM      Passed - Valid encounter within last 12 months    Recent Outpatient Visits          7 months ago Ingrown toenail of left foot   Primary Care at [facility], [place], MD   8 months ago Moderate persistent asthma with acute exacerbation   Primary Care at [facility], [place], MD   8 months ago Moderate persistent asthma with acute exacerbation   Primary Care at [facility], [place] A, MD   1 year ago Moderate persistent asthma with acute exacerbation   Primary Care at [facility], Arlie Solomons, MD   1 year ago Skin pain   Primary Care at [place], Dorian Heckle, [place]      Future Appointments            In 1 week Glendale Chard, MD [facility]   In 3 months  [facility]   In 3 months [facility]worth, Sandrea Matte [facility]

## 2018-12-01 ENCOUNTER — Encounter: Payer: Self-pay | Admitting: Internal Medicine

## 2018-12-01 ENCOUNTER — Ambulatory Visit: Payer: Medicare Other | Admitting: Internal Medicine

## 2018-12-01 VITALS — BP 122/74 | HR 93 | Temp 98.1°F | Ht 60.0 in | Wt 180.4 lb

## 2018-12-01 DIAGNOSIS — E1165 Type 2 diabetes mellitus with hyperglycemia: Secondary | ICD-10-CM

## 2018-12-01 DIAGNOSIS — Z6835 Body mass index (BMI) 35.0-35.9, adult: Secondary | ICD-10-CM | POA: Diagnosis not present

## 2018-12-01 DIAGNOSIS — K5900 Constipation, unspecified: Secondary | ICD-10-CM
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
## 2018-12-01 NOTE — Patient Instructions (Signed)
Coping With Diabetes Diabetes (type 1 diabetes mellitus or type 2 diabetes mellitus) is a condition in which the body does not have enough of a hormone called insulin, or the body does not respond properly to insulin. Normally, insulin allows sugars (glucose) to enter cells in the body. The cells use glucose for energy. With diabetes, extra glucose builds up in the blood instead of going into cells, which results in high blood glucose (hyperglycemia). How to manage lifestyle changes Managing diabetes includes medical treatments as well as lifestyle changes. If diabetes is not managed well, serious physical and emotional complications can occur. Taking good care of yourself means that you are responsible for:  Monitoring glucose regularly.  Eating a healthy diet.  Exercising regularly.  Meeting with health care providers.  Taking medicines as directed. Some people may feel a lot of stress about managing their diabetes. This is known as emotional distress, and it is very common. Living with diabetes can place you at risk for emotional distress, depression, or anxiety. These disorders can be confusing and can make diabetes management more difficult. How to recognize stress Emotional distress Symptoms of emotional distress include:  Anger about having a diagnosis of diabetes.  Fear or frustration about your diagnosis and the changes you need to make to manage the condition.  Being overly worried about the care that you need or the cost of the care you need.  Feeling like you caused your condition by doing something wrong.  Fear of unpredictable situations, like low or high blood glucose.  Feeling judged by your health care providers.  Feeling very alone with the disease.  Getting too tired or "burned out" with the demands of daily care. Depression Having diabetes means that you are at a higher risk for depression. Having depression also means that you are at a higher risk for diabetes.  Your health care provider may test (screen) you for symptoms of depression. It is important to recognize depression symptoms and to start treatment for it soon after it is diagnosed. The following are some symptoms of depression:  Loss of interest in things that you used to enjoy.  Trouble sleeping, or often waking up early and not being able to get back to sleep.  A change in appetite.  Feeling tired most of the day.  Feeling nervous and anxious.  Feeling guilty and worrying that you are a burden to others.  Feeling depressed more often than you do not feel that way.  Thoughts of hurting yourself or feeling that you want to die. If you have any of these symptoms for 2 weeks or longer, reach out to a health care provider. Where to find support   Ask your health care provider to recommend a therapist who understands both depression and diabetes.  Search for information and support from the American Diabetes Association: www.diabetes.org  Find a certified diabetes educator and make an appointment through [facility] [employer]: www.diabeteseducator.org Follow these instructions at home: Managing emotional distress The following are some ways to manage emotional distress:  Talk with your health care provider or certified diabetes educator. Consider working with a counselor or therapist.  Learn as much as you can about diabetes and its treatment. Meet with a certified diabetes educator or take a class to learn how to manage your condition.  Keep a journal of your thoughts and concerns.  Accept that some things are out of your control.  Talk with other people who have diabetes. It can help  to talk with others about the emotional distress that you feel. °· Find ways to manage stress that work for you. These may include art or music therapy, exercise, meditation, and hobbies. °· Seek support from spiritual leaders, family, and friends. °General  instructions °· Follow your diabetes management plan. °· Keep all follow-up visits as told by your health care provider. This is important. °Get help right away if: °· You have thoughts about hurting yourself or others. °If you ever feel like you may hurt yourself or others, or have thoughts about taking your own life, get help right away. You can go to your nearest emergency department or call: °· Your local emergency services (911 in the [place] °· A suicide crisis helpline, such as the National Suicide Prevention Lifeline at 1-800-273-8255. This is open 24 hours a day. °Summary °· Diabetes (type 1 diabetes mellitus or type 2 diabetes mellitus) is a condition in which the body does not have enough of a hormone called insulin, or the body does not respond properly to insulin. °· Living with diabetes puts you at risk for medical issues, and it also puts you at risk for emotional issues such as emotional distress, depression, and anxiety. °· Recognizing the symptoms of emotional distress and depression may help you avoid problems with your diabetes control. It is important to start treatment for emotional distress and depression soon after they are diagnosed. °· Having diabetes means that you are at a higher risk for depression. Ask your health care provider to recommend a therapist who understands both depression and diabetes. °· If you experience symptoms of emotional distress or depression, it is important to discuss this with your health care provider, certified diabetes educator, or therapist. °This information is not intended to replace advice given to you by your health care provider. Make sure you discuss any questions you have with your health care provider. °Document Released: 03/13/2017 Document Revised: 03/13/2017 Document Reviewed: 03/13/2017 °Elsevier Interactive Patient Education © 2019 [employer] ° °

## 2018-12-09 ENCOUNTER — Encounter: Payer: Self-pay | Admitting: Internal Medicine

## 2018-12-11 ENCOUNTER — Other Ambulatory Visit: Payer: Self-pay | Admitting: Internal Medicine

## 2018-12-21 ENCOUNTER — Institutional Professional Consult (permissible substitution): Payer: Medicare Other | Admitting: Neurology

## 2018-12-31 ENCOUNTER — Ambulatory Visit: Payer: Medicare Other | Admitting: Internal Medicine

## 2018-12-31 ENCOUNTER — Encounter: Payer: Self-pay | Admitting: Internal Medicine

## 2018-12-31 VITALS — BP 114/68 | HR 103 | Temp 97.4°F | Ht 60.0 in | Wt 180.4 lb

## 2018-12-31 DIAGNOSIS — K5909 Other constipation: Secondary | ICD-10-CM | POA: Diagnosis not present

## 2018-12-31 DIAGNOSIS — E1165 Type 2 diabetes mellitus with hyperglycemia: Secondary | ICD-10-CM | POA: Diagnosis not present

## 2018-12-31 DIAGNOSIS — M3320 Polymyositis, organ involvement unspecified: Secondary | ICD-10-CM

## 2018-12-31 DIAGNOSIS — D582 Other hemoglobinopathies: Secondary | ICD-10-CM | POA: Diagnosis not present

## 2018-12-31 DIAGNOSIS — Z6835 Body mass index (BMI) 35.0-35.9, adult: Secondary | ICD-10-CM

## 2018-12-31 LAB — POCT UA - MICROALBUMIN
Albumin/Creatinine Ratio, Urine, POC: <30
Creatinine, POC: 300 mg/dL
Microalbumin Ur, POC: 80 mg/L

## 2019-01-01 LAB — BMP8+EGFR
BUN/Creatinine Ratio: 15 (ref 12–28)
BUN: 11 mg/dL (ref 8–27)
CO2: 23 mmol/L (ref 20–29)
Calcium: 10 mg/dL (ref 8.7–10.3)
Chloride: 105 mmol/L (ref 96–106)
Creatinine, Ser: 0.72 mg/dL (ref 0.57–1.00)
GFR calc Af Amer: 100 mL/min/{1.73_m2} (ref >59)
GFR calc non Af Amer: 86 mL/min/{1.73_m2} (ref >59)
Glucose: 132 mg/dL — ABNORMAL HIGH (ref 65–99)
Potassium: 4.2 mmol/L (ref 3.5–5.2)
Sodium: 143 mmol/L (ref 134–144)

## 2019-01-01 LAB — HEMOGLOBIN A1C
Est. average glucose Bld gHb Est-mCnc: 174 mg/dL
Hgb A1c MFr Bld: 7.7 % — ABNORMAL HIGH (ref 4.8–5.6)
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
## 2019-01-05 ENCOUNTER — Encounter: Payer: Self-pay | Admitting: Internal Medicine

## 2019-01-29 ENCOUNTER — Other Ambulatory Visit: Payer: Self-pay

## 2019-01-29 MED ORDER — SEMAGLUTIDE (1 MG/DOSE) 2 MG/1.5ML ~~LOC~~ SOPN: 1.0000 mg | PEN_INJECTOR | SUBCUTANEOUS | 0 refills | Status: DC

## 2019-02-09 ENCOUNTER — Other Ambulatory Visit: Payer: Self-pay | Admitting: Internal Medicine

## 2019-02-13 DIAGNOSIS — K219 Gastro-esophageal reflux disease without esophagitis: Secondary | ICD-10-CM | POA: Insufficient documentation

## 2019-02-13 DIAGNOSIS — N6011 Diffuse cystic mastopathy of right breast: Secondary | ICD-10-CM | POA: Insufficient documentation

## 2019-02-21 ENCOUNTER — Other Ambulatory Visit: Payer: Self-pay | Admitting: Internal Medicine

## 2019-02-23 ENCOUNTER — Other Ambulatory Visit: Payer: Self-pay | Admitting: Internal Medicine

## 2019-03-14 ENCOUNTER — Other Ambulatory Visit: Payer: Self-pay | Admitting: Internal Medicine

## 2019-03-18 ENCOUNTER — Ambulatory Visit: Payer: Medicare Other

## 2019-03-18 ENCOUNTER — Encounter: Payer: Medicare Other | Admitting: Internal Medicine

## 2019-04-01 ENCOUNTER — Other Ambulatory Visit: Payer: Self-pay | Admitting: Internal Medicine

## 2019-04-17 ENCOUNTER — Other Ambulatory Visit: Payer: Self-pay | Admitting: Internal Medicine

## 2019-05-08 ENCOUNTER — Other Ambulatory Visit: Payer: Self-pay | Admitting: Internal Medicine

## 2019-08-25 IMAGING — MR MR HEAD W/O CM
9 of 11 series · 33 of 48 positions shown · non-contrast
Comparison: Previous MRI from 11/26/2008.

CLINICAL DATA: Initial evaluation for acute headache, memory loss.

EXAM:
MRI HEAD WITHOUT CONTRAST
TECHNIQUE: Multiplanar, multiecho pulse sequences of the brain and surrounding
structures were obtained without intravenous contrast.

[Series 3: DWI · axial · 3.0mm · 0.94mm/px · z∈[-109,+26]mm · 7 of 94 slices shown (1 of 2)]
[im 1/94]
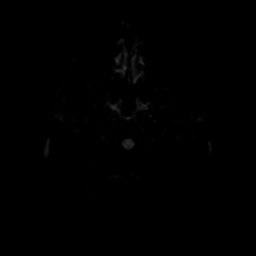
[im 16/94]
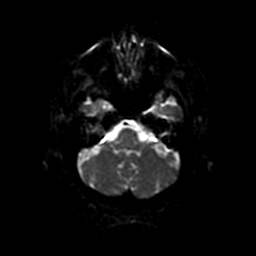
[im 32/94]
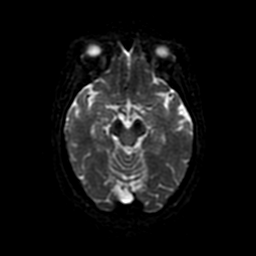
[im 47/94]
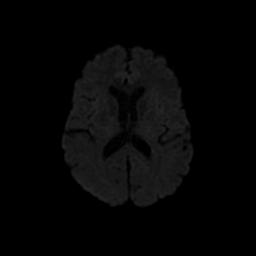
[im 63/94]
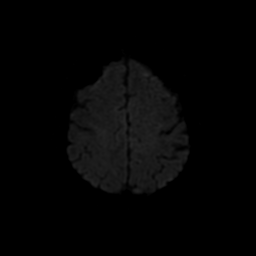
[im 78/94]
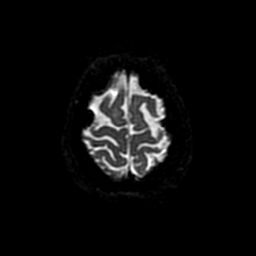
[im 94/94]
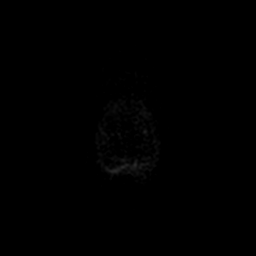

[Series 4: DWI · coronal · 4.0mm · 0.94mm/px · 6 of 72 slices shown (2 of 2)]
[im 1/72]
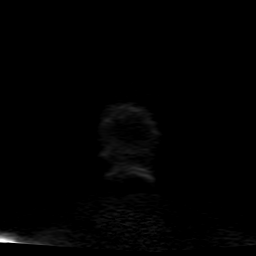
[im 15/72]
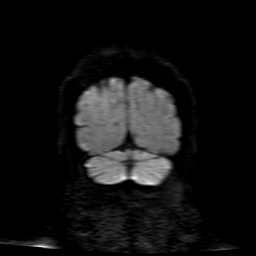
[im 29/72]
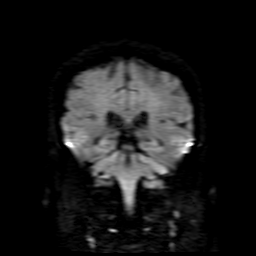
[im 43/72]
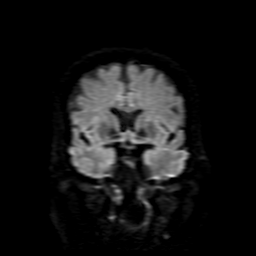
[im 57/72]
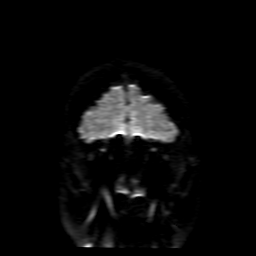
[im 72/72]
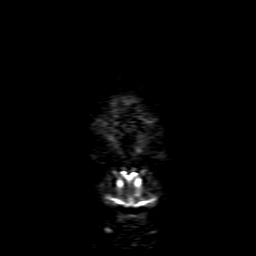

[Series 5: FLAIR · sagittal · 5.0mm · 0.47mm/px · 2 of 23 slices shown (1 of 2)]
[im 1/23]
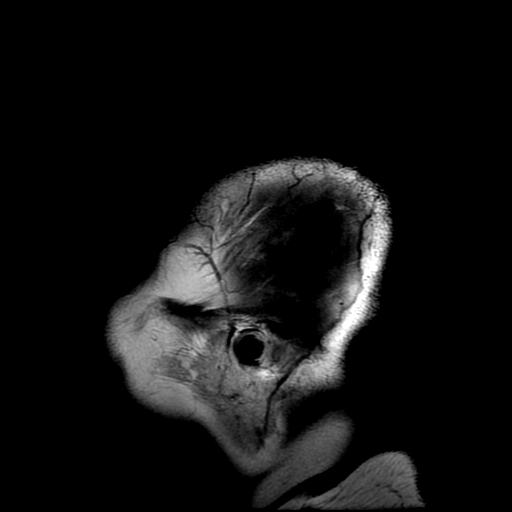
[im 23/23]
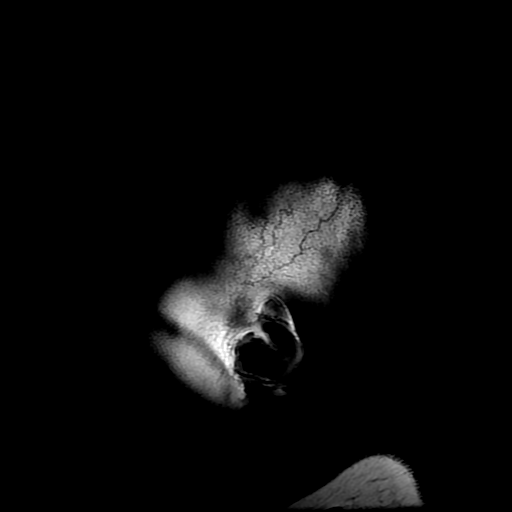

[Series 7: T2 · axial · 5.0mm · 0.47mm/px · z∈[-120,+22]mm · 2 of 25 slices shown (1 of 2)]
[im 1/25]
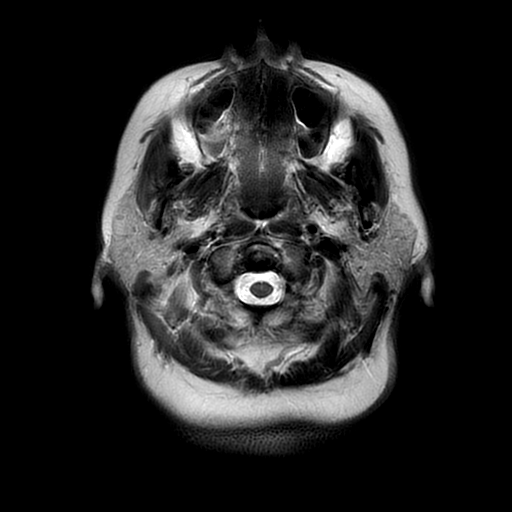
[im 25/25]
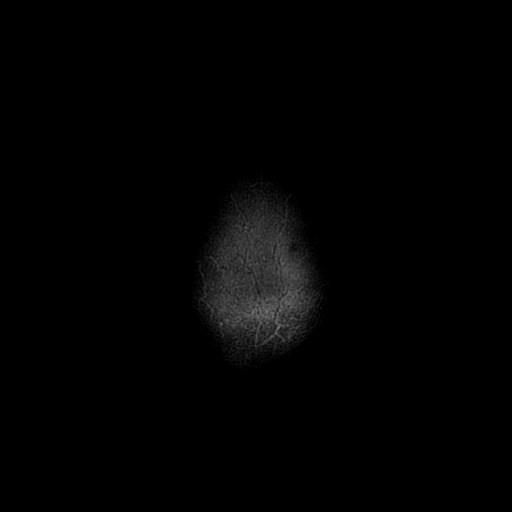

[Series 8: FLAIR · axial · 5.0mm · 0.47mm/px · z∈[-120,+22]mm · 2 of 25 slices shown (2 of 2)]
[im 1/25]
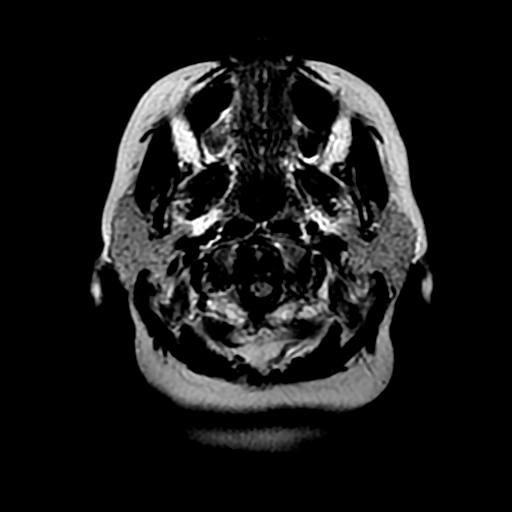
[im 25/25]
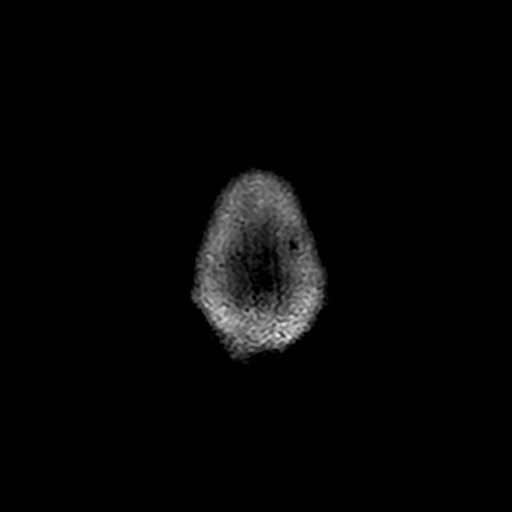

[Series 9: (person_name) · axial · 3.0mm · 0.47mm/px · z∈[-126,-44]mm · 5 of 100 slices shown]
[im 1/100]
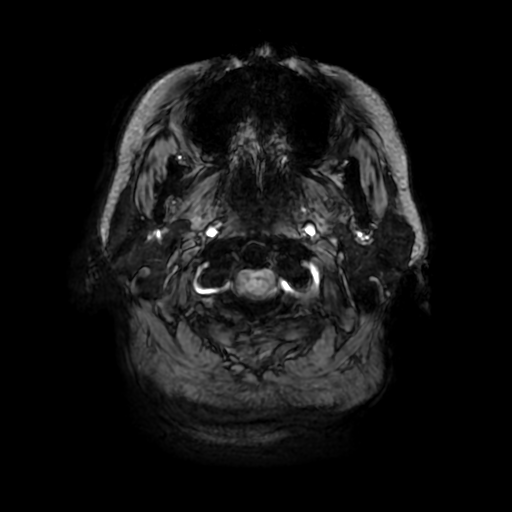
[im 15/100]
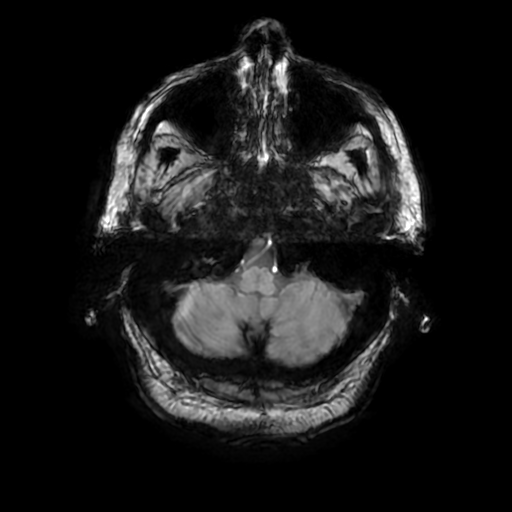
[im 29/100]
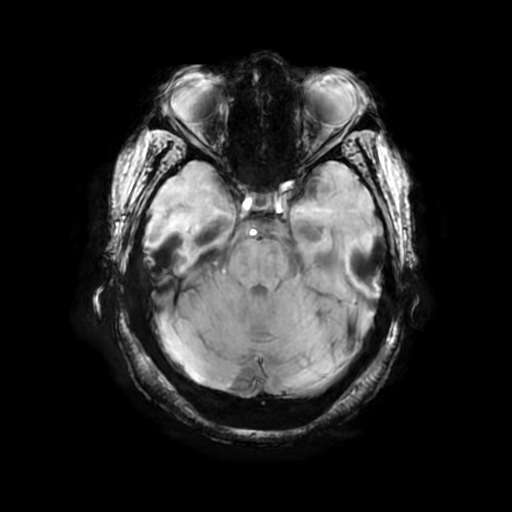
[im 43/100]
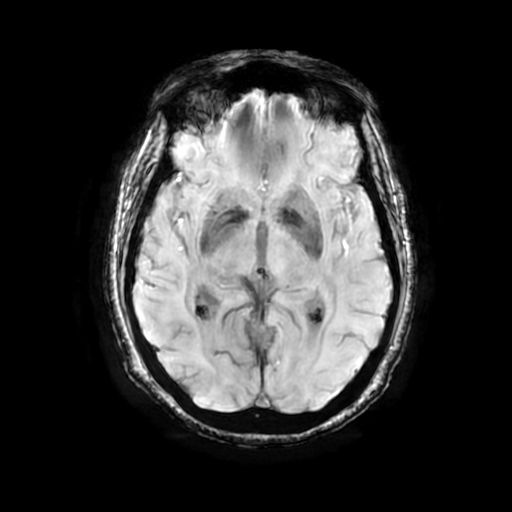
[im 57/100]
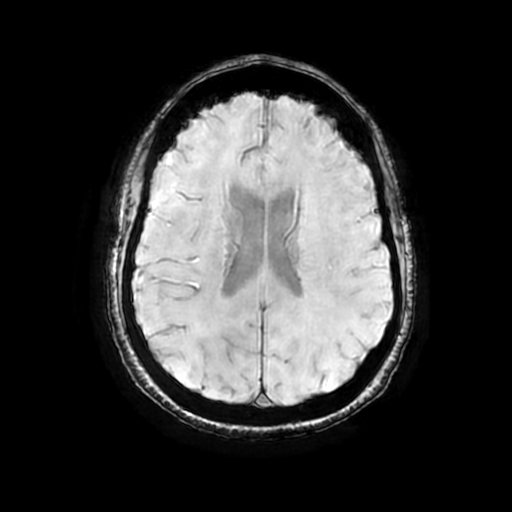

[Series 11: T2 · coronal · 5.0mm · 0.39mm/px · 2 of 29 slices shown (2 of 2)]
[im 1/29]
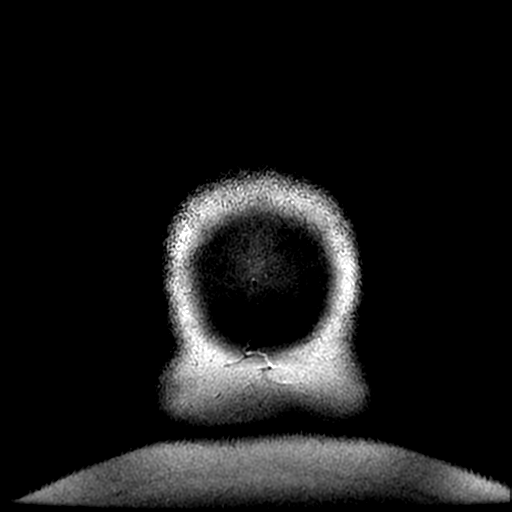
[im 29/29]
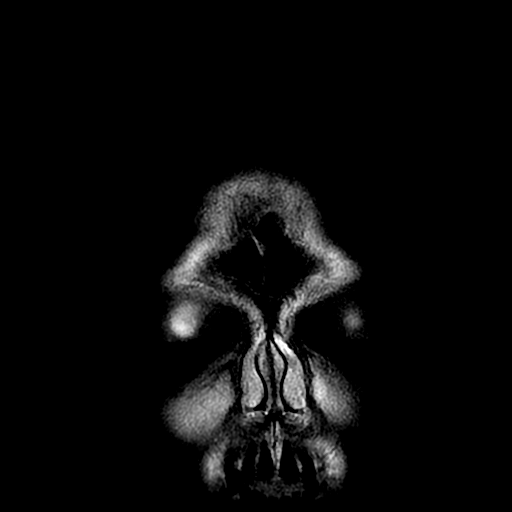

[Series 350: ADC · axial · 3.0mm · 0.94mm/px · z∈[-109,+26]mm · 4 of 47 slices shown (1 of 2)]
[im 1/47]
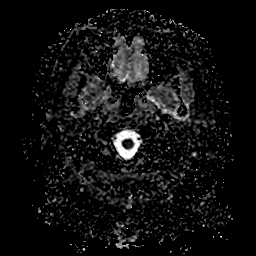
[im 16/47]
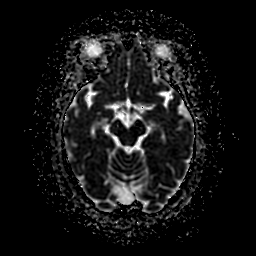
[im 31/47]
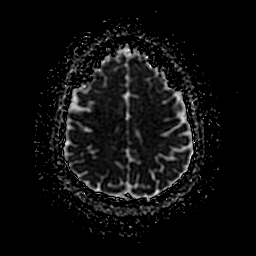
[im 47/47]
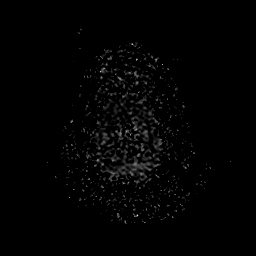

[Series 450: ADC · coronal · 4.0mm · 0.94mm/px · 3 of 36 slices shown (2 of 2)]
[im 1/36]
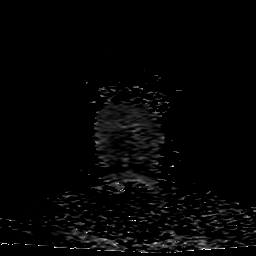
[im 18/36]
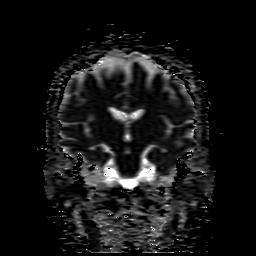
[im 36/36]
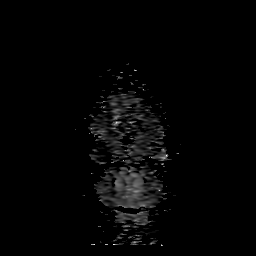

[33 of 48 positions shown; findings below may reference images not displayed]

FINDINGS: Brain: Cerebral volume within normal limits for age. Minimal
T2/FLAIR hyperintensity noted within the periventricular white
matter, most like related to chronic micro vessel ischemic changes,
minimal for age.

No abnormal foci of restricted diffusion to suggest acute or
subacute ischemia. Gray-white matter differentiation maintained. No
evidence for chronic infarction. No evidence for acute or chronic
intracranial hemorrhage.

No mass lesion, midline shift or mass effect. No hydrocephalus. No
extra-axial fluid collection. Major dural sinuses are grossly
patent.

Pituitary gland suprasellar region within normal limits. Midline
structures intact and normal.

Vascular: Major intracranial vascular flow voids are maintained.

Skull and upper cervical spine: Craniocervical junction within
normal limits. Visualized upper cervical spine unremarkable. Bone
marrow signal intensity normal. No scalp soft tissue abnormality.

Sinuses/Orbits: Globes and orbital soft tissues within normal
limits. Paranasal sinuses are clear. No mastoid effusion. Inner ear
structures normal.

Other: None.
IMPRESSION: 1. No acute intracranial abnormality.
2. Minimal chronic microvascular ischemic disease for age. Otherwise
normal brain MRI.

## 2019-12-24 DIAGNOSIS — R0789 Other chest pain: Secondary | ICD-10-CM | POA: Diagnosis not present

## 2019-12-27 DIAGNOSIS — E1165 Type 2 diabetes mellitus with hyperglycemia: Secondary | ICD-10-CM | POA: Diagnosis not present

## 2019-12-30 DIAGNOSIS — R29818 Other symptoms and signs involving the nervous system: Secondary | ICD-10-CM | POA: Diagnosis not present

## 2019-12-30 DIAGNOSIS — I672 Cerebral atherosclerosis: Secondary | ICD-10-CM | POA: Diagnosis not present

## 2019-12-30 DIAGNOSIS — I10 Essential (primary) hypertension: Secondary | ICD-10-CM | POA: Diagnosis not present

## 2019-12-30 DIAGNOSIS — M1990 Unspecified osteoarthritis, unspecified site: Secondary | ICD-10-CM | POA: Diagnosis not present

## 2019-12-30 DIAGNOSIS — R0789 Other chest pain: Secondary | ICD-10-CM | POA: Diagnosis not present

## 2019-12-30 DIAGNOSIS — N390 Urinary tract infection, site not specified: Secondary | ICD-10-CM | POA: Diagnosis not present

## 2019-12-30 DIAGNOSIS — R531 Weakness: Secondary | ICD-10-CM | POA: Diagnosis not present

## 2019-12-30 DIAGNOSIS — E119 Type 2 diabetes mellitus without complications: Secondary | ICD-10-CM | POA: Diagnosis not present

## 2019-12-30 DIAGNOSIS — H409 Unspecified glaucoma: Secondary | ICD-10-CM | POA: Diagnosis not present

## 2019-12-30 DIAGNOSIS — I6789 Other cerebrovascular disease: Secondary | ICD-10-CM | POA: Diagnosis not present

## 2019-12-30 DIAGNOSIS — I639 Cerebral infarction, unspecified: Secondary | ICD-10-CM | POA: Diagnosis not present

## 2019-12-30 DIAGNOSIS — R079 Chest pain, unspecified: Secondary | ICD-10-CM | POA: Diagnosis not present

## 2019-12-30 DIAGNOSIS — R29810 Facial weakness: Secondary | ICD-10-CM | POA: Diagnosis not present

## 2019-12-30 DIAGNOSIS — R4182 Altered mental status, unspecified: Secondary | ICD-10-CM | POA: Diagnosis not present

## 2019-12-30 DIAGNOSIS — E785 Hyperlipidemia, unspecified: Secondary | ICD-10-CM | POA: Diagnosis not present

## 2019-12-30 DIAGNOSIS — M8580 Other specified disorders of bone density and structure, unspecified site: Secondary | ICD-10-CM | POA: Diagnosis not present

## 2019-12-31 DIAGNOSIS — I272 Pulmonary hypertension, unspecified: Secondary | ICD-10-CM | POA: Diagnosis not present

## 2019-12-31 DIAGNOSIS — I639 Cerebral infarction, unspecified: Secondary | ICD-10-CM | POA: Diagnosis not present

## 2019-12-31 DIAGNOSIS — R079 Chest pain, unspecified: Secondary | ICD-10-CM | POA: Diagnosis not present

## 2019-12-31 DIAGNOSIS — Z716 Tobacco abuse counseling: Secondary | ICD-10-CM | POA: Diagnosis not present

## 2019-12-31 DIAGNOSIS — I10 Essential (primary) hypertension: Secondary | ICD-10-CM | POA: Diagnosis not present

## 2019-12-31 DIAGNOSIS — I6789 Other cerebrovascular disease: Secondary | ICD-10-CM | POA: Diagnosis not present

## 2019-12-31 DIAGNOSIS — N390 Urinary tract infection, site not specified: Secondary | ICD-10-CM | POA: Diagnosis not present

## 2019-12-31 DIAGNOSIS — F17200 Nicotine dependence, unspecified, uncomplicated: Secondary | ICD-10-CM | POA: Diagnosis not present

## 2020-01-01 DIAGNOSIS — M19012 Primary osteoarthritis, left shoulder: Secondary | ICD-10-CM | POA: Diagnosis not present

## 2020-01-01 DIAGNOSIS — F17200 Nicotine dependence, unspecified, uncomplicated: Secondary | ICD-10-CM | POA: Diagnosis not present

## 2020-01-01 DIAGNOSIS — Z716 Tobacco abuse counseling: Secondary | ICD-10-CM | POA: Diagnosis not present

## 2020-01-01 DIAGNOSIS — I639 Cerebral infarction, unspecified: Secondary | ICD-10-CM | POA: Diagnosis not present

## 2020-01-01 DIAGNOSIS — N390 Urinary tract infection, site not specified: Secondary | ICD-10-CM | POA: Diagnosis not present

## 2020-01-01 DIAGNOSIS — I6789 Other cerebrovascular disease: Secondary | ICD-10-CM | POA: Diagnosis not present

## 2020-01-01 DIAGNOSIS — M25512 Pain in left shoulder: Secondary | ICD-10-CM | POA: Diagnosis not present

## 2020-01-01 DIAGNOSIS — R0789 Other chest pain: Secondary | ICD-10-CM | POA: Diagnosis not present

## 2020-01-01 DIAGNOSIS — I10 Essential (primary) hypertension: Secondary | ICD-10-CM | POA: Diagnosis not present

## 2020-01-01 DIAGNOSIS — R079 Chest pain, unspecified: Secondary | ICD-10-CM | POA: Diagnosis not present

## 2020-01-02 DIAGNOSIS — Z716 Tobacco abuse counseling: Secondary | ICD-10-CM | POA: Diagnosis not present

## 2020-01-02 DIAGNOSIS — I6789 Other cerebrovascular disease: Secondary | ICD-10-CM | POA: Diagnosis not present

## 2020-01-02 DIAGNOSIS — F17200 Nicotine dependence, unspecified, uncomplicated: Secondary | ICD-10-CM | POA: Diagnosis not present

## 2020-01-02 DIAGNOSIS — I10 Essential (primary) hypertension: Secondary | ICD-10-CM | POA: Diagnosis not present

## 2020-01-02 DIAGNOSIS — N390 Urinary tract infection, site not specified: Secondary | ICD-10-CM | POA: Diagnosis not present

## 2020-01-02 DIAGNOSIS — R079 Chest pain, unspecified: Secondary | ICD-10-CM | POA: Diagnosis not present

## 2020-01-03 DIAGNOSIS — E1165 Type 2 diabetes mellitus with hyperglycemia: Secondary | ICD-10-CM | POA: Diagnosis not present

## 2020-01-04 DIAGNOSIS — H409 Unspecified glaucoma: Secondary | ICD-10-CM | POA: Diagnosis not present

## 2020-01-04 DIAGNOSIS — M797 Fibromyalgia: Secondary | ICD-10-CM | POA: Diagnosis not present

## 2020-01-04 DIAGNOSIS — I69354 Hemiplegia and hemiparesis following cerebral infarction affecting left non-dominant side: Secondary | ICD-10-CM | POA: Diagnosis not present

## 2020-01-04 DIAGNOSIS — G8929 Other chronic pain: Secondary | ICD-10-CM | POA: Diagnosis not present

## 2020-01-04 DIAGNOSIS — E119 Type 2 diabetes mellitus without complications: Secondary | ICD-10-CM | POA: Diagnosis not present

## 2020-01-04 DIAGNOSIS — N390 Urinary tract infection, site not specified: Secondary | ICD-10-CM | POA: Diagnosis not present

## 2020-01-04 DIAGNOSIS — I69392 Facial weakness following cerebral infarction: Secondary | ICD-10-CM | POA: Diagnosis not present

## 2020-01-04 DIAGNOSIS — I10 Essential (primary) hypertension: Secondary | ICD-10-CM | POA: Diagnosis not present

## 2020-01-04 DIAGNOSIS — J45909 Unspecified asthma, uncomplicated: Secondary | ICD-10-CM | POA: Diagnosis not present

## 2020-01-07 DIAGNOSIS — R0789 Other chest pain: Secondary | ICD-10-CM | POA: Diagnosis not present

## 2020-01-12 DIAGNOSIS — E119 Type 2 diabetes mellitus without complications: Secondary | ICD-10-CM | POA: Diagnosis not present

## 2020-01-12 DIAGNOSIS — I69392 Facial weakness following cerebral infarction: Secondary | ICD-10-CM | POA: Diagnosis not present

## 2020-01-12 DIAGNOSIS — J45909 Unspecified asthma, uncomplicated: Secondary | ICD-10-CM | POA: Diagnosis not present

## 2020-01-12 DIAGNOSIS — Z794 Long term (current) use of insulin: Secondary | ICD-10-CM | POA: Diagnosis not present

## 2020-01-12 DIAGNOSIS — H409 Unspecified glaucoma: Secondary | ICD-10-CM | POA: Diagnosis not present

## 2020-01-12 DIAGNOSIS — M797 Fibromyalgia: Secondary | ICD-10-CM | POA: Diagnosis not present

## 2020-01-12 DIAGNOSIS — H25813 Combined forms of age-related cataract, bilateral: Secondary | ICD-10-CM | POA: Diagnosis not present

## 2020-01-12 DIAGNOSIS — I10 Essential (primary) hypertension: Secondary | ICD-10-CM | POA: Diagnosis not present

## 2020-01-12 DIAGNOSIS — N390 Urinary tract infection, site not specified: Secondary | ICD-10-CM | POA: Diagnosis not present

## 2020-01-12 DIAGNOSIS — H16223 Keratoconjunctivitis sicca, not specified as Sjogren's, bilateral: Secondary | ICD-10-CM | POA: Diagnosis not present

## 2020-01-12 DIAGNOSIS — G8929 Other chronic pain: Secondary | ICD-10-CM | POA: Diagnosis not present

## 2020-01-12 DIAGNOSIS — I69354 Hemiplegia and hemiparesis following cerebral infarction affecting left non-dominant side: Secondary | ICD-10-CM | POA: Diagnosis not present

## 2020-01-26 DIAGNOSIS — R200 Anesthesia of skin: Secondary | ICD-10-CM | POA: Diagnosis not present

## 2020-01-26 DIAGNOSIS — M542 Cervicalgia: Secondary | ICD-10-CM | POA: Diagnosis not present

## 2020-01-26 DIAGNOSIS — J45909 Unspecified asthma, uncomplicated: Secondary | ICD-10-CM | POA: Diagnosis not present

## 2020-01-26 DIAGNOSIS — R202 Paresthesia of skin: Secondary | ICD-10-CM | POA: Diagnosis not present

## 2020-01-26 DIAGNOSIS — E785 Hyperlipidemia, unspecified: Secondary | ICD-10-CM | POA: Diagnosis not present

## 2020-01-26 DIAGNOSIS — M797 Fibromyalgia: Secondary | ICD-10-CM | POA: Diagnosis not present

## 2020-01-26 DIAGNOSIS — M79602 Pain in left arm: Secondary | ICD-10-CM | POA: Diagnosis not present

## 2020-01-26 DIAGNOSIS — M546 Pain in thoracic spine: Secondary | ICD-10-CM | POA: Diagnosis not present

## 2020-01-28 DIAGNOSIS — N6011 Diffuse cystic mastopathy of right breast: Secondary | ICD-10-CM | POA: Diagnosis not present

## 2020-01-28 DIAGNOSIS — J301 Allergic rhinitis due to pollen: Secondary | ICD-10-CM | POA: Diagnosis not present

## 2020-01-28 DIAGNOSIS — L309 Dermatitis, unspecified: Secondary | ICD-10-CM | POA: Diagnosis not present

## 2020-01-28 DIAGNOSIS — Z0000 Encounter for general adult medical examination without abnormal findings: Secondary | ICD-10-CM | POA: Diagnosis not present

## 2020-01-28 DIAGNOSIS — K219 Gastro-esophageal reflux disease without esophagitis: Secondary | ICD-10-CM | POA: Diagnosis not present

## 2020-01-28 DIAGNOSIS — E119 Type 2 diabetes mellitus without complications: Secondary | ICD-10-CM | POA: Diagnosis not present

## 2020-01-28 DIAGNOSIS — J4530 Mild persistent asthma, uncomplicated: Secondary | ICD-10-CM | POA: Diagnosis not present

## 2020-01-28 DIAGNOSIS — R0789 Other chest pain: Secondary | ICD-10-CM | POA: Diagnosis not present

## 2020-01-28 DIAGNOSIS — M797 Fibromyalgia: Secondary | ICD-10-CM | POA: Diagnosis not present

## 2020-02-01 DIAGNOSIS — M25512 Pain in left shoulder: Secondary | ICD-10-CM | POA: Diagnosis not present

## 2020-02-01 DIAGNOSIS — M546 Pain in thoracic spine: Secondary | ICD-10-CM | POA: Diagnosis not present

## 2020-02-01 DIAGNOSIS — M5013 Cervical disc disorder with radiculopathy, cervicothoracic region: Secondary | ICD-10-CM | POA: Diagnosis not present

## 2020-02-01 DIAGNOSIS — M542 Cervicalgia: Secondary | ICD-10-CM | POA: Diagnosis not present

## 2020-02-02 DIAGNOSIS — E1165 Type 2 diabetes mellitus with hyperglycemia: Secondary | ICD-10-CM | POA: Diagnosis not present

## 2020-02-11 DIAGNOSIS — M25512 Pain in left shoulder: Secondary | ICD-10-CM | POA: Diagnosis not present

## 2020-02-11 DIAGNOSIS — M47816 Spondylosis without myelopathy or radiculopathy, lumbar region: Secondary | ICD-10-CM | POA: Diagnosis not present

## 2020-02-11 DIAGNOSIS — M47812 Spondylosis without myelopathy or radiculopathy, cervical region: Secondary | ICD-10-CM | POA: Diagnosis not present

## 2020-02-11 DIAGNOSIS — M7062 Trochanteric bursitis, left hip: Secondary | ICD-10-CM | POA: Diagnosis not present

## 2020-02-11 DIAGNOSIS — M47814 Spondylosis without myelopathy or radiculopathy, thoracic region: Secondary | ICD-10-CM | POA: Diagnosis not present

## 2020-02-11 DIAGNOSIS — M25511 Pain in right shoulder: Secondary | ICD-10-CM | POA: Diagnosis not present

## 2020-02-11 DIAGNOSIS — Z79899 Other long term (current) drug therapy: Secondary | ICD-10-CM | POA: Diagnosis not present

## 2020-02-21 DIAGNOSIS — E1140 Type 2 diabetes mellitus with diabetic neuropathy, unspecified: Secondary | ICD-10-CM | POA: Diagnosis not present

## 2020-02-21 DIAGNOSIS — L600 Ingrowing nail: Secondary | ICD-10-CM | POA: Diagnosis not present

## 2020-02-21 DIAGNOSIS — M79602 Pain in left arm: Secondary | ICD-10-CM | POA: Diagnosis not present

## 2020-02-21 DIAGNOSIS — R202 Paresthesia of skin: Secondary | ICD-10-CM | POA: Diagnosis not present

## 2020-02-21 DIAGNOSIS — R200 Anesthesia of skin: Secondary | ICD-10-CM | POA: Diagnosis not present

## 2020-02-23 DIAGNOSIS — M47812 Spondylosis without myelopathy or radiculopathy, cervical region: Secondary | ICD-10-CM | POA: Diagnosis not present

## 2020-02-23 DIAGNOSIS — M4802 Spinal stenosis, cervical region: Secondary | ICD-10-CM | POA: Diagnosis not present

## 2020-02-23 DIAGNOSIS — M4804 Spinal stenosis, thoracic region: Secondary | ICD-10-CM | POA: Diagnosis not present

## 2020-02-23 DIAGNOSIS — M40204 Unspecified kyphosis, thoracic region: Secondary | ICD-10-CM | POA: Diagnosis not present

## 2020-02-23 DIAGNOSIS — M5021 Other cervical disc displacement,  high cervical region: Secondary | ICD-10-CM | POA: Diagnosis not present

## 2020-02-23 DIAGNOSIS — M5134 Other intervertebral disc degeneration, thoracic region: Secondary | ICD-10-CM | POA: Diagnosis not present

## 2020-03-01 DIAGNOSIS — E1165 Type 2 diabetes mellitus with hyperglycemia: Secondary | ICD-10-CM | POA: Diagnosis not present

## 2020-03-03 DIAGNOSIS — M25511 Pain in right shoulder: Secondary | ICD-10-CM | POA: Diagnosis not present

## 2020-03-03 DIAGNOSIS — M47814 Spondylosis without myelopathy or radiculopathy, thoracic region: Secondary | ICD-10-CM | POA: Diagnosis not present

## 2020-03-03 DIAGNOSIS — M47816 Spondylosis without myelopathy or radiculopathy, lumbar region: Secondary | ICD-10-CM | POA: Diagnosis not present

## 2020-03-03 DIAGNOSIS — M25512 Pain in left shoulder: Secondary | ICD-10-CM | POA: Diagnosis not present

## 2020-03-03 DIAGNOSIS — M47812 Spondylosis without myelopathy or radiculopathy, cervical region: Secondary | ICD-10-CM | POA: Diagnosis not present

## 2020-03-03 DIAGNOSIS — M7062 Trochanteric bursitis, left hip: Secondary | ICD-10-CM | POA: Diagnosis not present

## 2020-03-06 DIAGNOSIS — E1165 Type 2 diabetes mellitus with hyperglycemia: Secondary | ICD-10-CM | POA: Diagnosis not present

## 2020-03-07 DIAGNOSIS — M542 Cervicalgia: Secondary | ICD-10-CM | POA: Diagnosis not present

## 2020-03-07 DIAGNOSIS — M25512 Pain in left shoulder: Secondary | ICD-10-CM | POA: Diagnosis not present

## 2020-03-07 DIAGNOSIS — M5013 Cervical disc disorder with radiculopathy, cervicothoracic region: Secondary | ICD-10-CM | POA: Diagnosis not present

## 2020-03-07 DIAGNOSIS — M546 Pain in thoracic spine: Secondary | ICD-10-CM | POA: Diagnosis not present

## 2020-03-11 DIAGNOSIS — M542 Cervicalgia: Secondary | ICD-10-CM | POA: Diagnosis not present

## 2020-03-11 DIAGNOSIS — M5013 Cervical disc disorder with radiculopathy, cervicothoracic region: Secondary | ICD-10-CM | POA: Diagnosis not present

## 2020-03-11 DIAGNOSIS — M546 Pain in thoracic spine: Secondary | ICD-10-CM | POA: Diagnosis not present

## 2020-03-11 DIAGNOSIS — M25512 Pain in left shoulder: Secondary | ICD-10-CM | POA: Diagnosis not present

## 2020-03-15 DIAGNOSIS — M546 Pain in thoracic spine: Secondary | ICD-10-CM | POA: Diagnosis not present

## 2020-03-15 DIAGNOSIS — M542 Cervicalgia: Secondary | ICD-10-CM | POA: Diagnosis not present

## 2020-03-15 DIAGNOSIS — M25512 Pain in left shoulder: Secondary | ICD-10-CM | POA: Diagnosis not present

## 2020-03-15 DIAGNOSIS — M5013 Cervical disc disorder with radiculopathy, cervicothoracic region: Secondary | ICD-10-CM | POA: Diagnosis not present

## 2020-03-17 DIAGNOSIS — R0789 Other chest pain: Secondary | ICD-10-CM | POA: Diagnosis not present

## 2020-03-17 DIAGNOSIS — M25512 Pain in left shoulder: Secondary | ICD-10-CM | POA: Diagnosis not present

## 2020-03-17 DIAGNOSIS — M542 Cervicalgia: Secondary | ICD-10-CM | POA: Diagnosis not present

## 2020-03-17 DIAGNOSIS — M546 Pain in thoracic spine: Secondary | ICD-10-CM | POA: Diagnosis not present

## 2020-03-17 DIAGNOSIS — M5013 Cervical disc disorder with radiculopathy, cervicothoracic region: Secondary | ICD-10-CM | POA: Diagnosis not present

## 2020-03-20 DIAGNOSIS — M25512 Pain in left shoulder: Secondary | ICD-10-CM | POA: Diagnosis not present

## 2020-03-20 DIAGNOSIS — M5013 Cervical disc disorder with radiculopathy, cervicothoracic region: Secondary | ICD-10-CM | POA: Diagnosis not present

## 2020-03-20 DIAGNOSIS — M546 Pain in thoracic spine: Secondary | ICD-10-CM | POA: Diagnosis not present

## 2020-03-20 DIAGNOSIS — M542 Cervicalgia: Secondary | ICD-10-CM | POA: Diagnosis not present

## 2020-03-22 DIAGNOSIS — M542 Cervicalgia: Secondary | ICD-10-CM | POA: Diagnosis not present

## 2020-03-22 DIAGNOSIS — M5013 Cervical disc disorder with radiculopathy, cervicothoracic region: Secondary | ICD-10-CM | POA: Diagnosis not present

## 2020-03-22 DIAGNOSIS — J4540 Moderate persistent asthma, uncomplicated: Secondary | ICD-10-CM | POA: Diagnosis not present

## 2020-03-22 DIAGNOSIS — M25512 Pain in left shoulder: Secondary | ICD-10-CM | POA: Diagnosis not present

## 2020-03-22 DIAGNOSIS — M546 Pain in thoracic spine: Secondary | ICD-10-CM | POA: Diagnosis not present

## 2020-03-22 DIAGNOSIS — J301 Allergic rhinitis due to pollen: Secondary | ICD-10-CM | POA: Diagnosis not present

## 2020-03-27 DIAGNOSIS — M5013 Cervical disc disorder with radiculopathy, cervicothoracic region: Secondary | ICD-10-CM | POA: Diagnosis not present

## 2020-03-27 DIAGNOSIS — M542 Cervicalgia: Secondary | ICD-10-CM | POA: Diagnosis not present

## 2020-03-27 DIAGNOSIS — M546 Pain in thoracic spine: Secondary | ICD-10-CM | POA: Diagnosis not present

## 2020-03-27 DIAGNOSIS — M25512 Pain in left shoulder: Secondary | ICD-10-CM | POA: Diagnosis not present

## 2020-04-03 DIAGNOSIS — M5013 Cervical disc disorder with radiculopathy, cervicothoracic region: Secondary | ICD-10-CM | POA: Diagnosis not present

## 2020-04-03 DIAGNOSIS — M25512 Pain in left shoulder: Secondary | ICD-10-CM | POA: Diagnosis not present

## 2020-04-03 DIAGNOSIS — M546 Pain in thoracic spine: Secondary | ICD-10-CM | POA: Diagnosis not present

## 2020-04-03 DIAGNOSIS — M542 Cervicalgia: Secondary | ICD-10-CM | POA: Diagnosis not present

## 2020-04-05 DIAGNOSIS — M25512 Pain in left shoulder: Secondary | ICD-10-CM | POA: Diagnosis not present

## 2020-04-05 DIAGNOSIS — M546 Pain in thoracic spine: Secondary | ICD-10-CM | POA: Diagnosis not present

## 2020-04-05 DIAGNOSIS — M5013 Cervical disc disorder with radiculopathy, cervicothoracic region: Secondary | ICD-10-CM | POA: Diagnosis not present

## 2020-04-05 DIAGNOSIS — M542 Cervicalgia: Secondary | ICD-10-CM | POA: Diagnosis not present

## 2020-04-11 DIAGNOSIS — M546 Pain in thoracic spine: Secondary | ICD-10-CM | POA: Diagnosis not present

## 2020-04-11 DIAGNOSIS — M5013 Cervical disc disorder with radiculopathy, cervicothoracic region: Secondary | ICD-10-CM | POA: Diagnosis not present

## 2020-04-11 DIAGNOSIS — M542 Cervicalgia: Secondary | ICD-10-CM | POA: Diagnosis not present

## 2020-04-11 DIAGNOSIS — M25512 Pain in left shoulder: Secondary | ICD-10-CM | POA: Diagnosis not present

## 2020-04-13 DIAGNOSIS — M25512 Pain in left shoulder: Secondary | ICD-10-CM | POA: Diagnosis not present

## 2020-04-13 DIAGNOSIS — M546 Pain in thoracic spine: Secondary | ICD-10-CM | POA: Diagnosis not present

## 2020-04-13 DIAGNOSIS — M5013 Cervical disc disorder with radiculopathy, cervicothoracic region: Secondary | ICD-10-CM | POA: Diagnosis not present

## 2020-04-13 DIAGNOSIS — M542 Cervicalgia: Secondary | ICD-10-CM | POA: Diagnosis not present

## 2020-04-17 DIAGNOSIS — M47816 Spondylosis without myelopathy or radiculopathy, lumbar region: Secondary | ICD-10-CM | POA: Diagnosis not present

## 2020-04-17 DIAGNOSIS — M5136 Other intervertebral disc degeneration, lumbar region: Secondary | ICD-10-CM | POA: Diagnosis not present

## 2020-04-17 DIAGNOSIS — M48061 Spinal stenosis, lumbar region without neurogenic claudication: Secondary | ICD-10-CM | POA: Diagnosis not present

## 2020-04-20 DIAGNOSIS — M5013 Cervical disc disorder with radiculopathy, cervicothoracic region: Secondary | ICD-10-CM | POA: Diagnosis not present

## 2020-04-20 DIAGNOSIS — M542 Cervicalgia: Secondary | ICD-10-CM | POA: Diagnosis not present

## 2020-04-20 DIAGNOSIS — M546 Pain in thoracic spine: Secondary | ICD-10-CM | POA: Diagnosis not present

## 2020-04-20 DIAGNOSIS — M25512 Pain in left shoulder: Secondary | ICD-10-CM | POA: Diagnosis not present

## 2020-04-21 DIAGNOSIS — M47814 Spondylosis without myelopathy or radiculopathy, thoracic region: Secondary | ICD-10-CM | POA: Diagnosis not present

## 2020-04-21 DIAGNOSIS — M47816 Spondylosis without myelopathy or radiculopathy, lumbar region: Secondary | ICD-10-CM | POA: Diagnosis not present

## 2020-04-21 DIAGNOSIS — M47812 Spondylosis without myelopathy or radiculopathy, cervical region: Secondary | ICD-10-CM | POA: Diagnosis not present

## 2020-04-21 DIAGNOSIS — M5412 Radiculopathy, cervical region: Secondary | ICD-10-CM | POA: Diagnosis not present

## 2020-04-26 DIAGNOSIS — M79602 Pain in left arm: Secondary | ICD-10-CM | POA: Diagnosis not present

## 2020-04-26 DIAGNOSIS — M47816 Spondylosis without myelopathy or radiculopathy, lumbar region: Secondary | ICD-10-CM | POA: Diagnosis not present

## 2020-04-26 DIAGNOSIS — R202 Paresthesia of skin: Secondary | ICD-10-CM | POA: Diagnosis not present

## 2020-04-26 DIAGNOSIS — M542 Cervicalgia: Secondary | ICD-10-CM | POA: Diagnosis not present

## 2020-04-26 DIAGNOSIS — M797 Fibromyalgia: Secondary | ICD-10-CM | POA: Diagnosis not present

## 2020-04-26 DIAGNOSIS — M546 Pain in thoracic spine: Secondary | ICD-10-CM | POA: Diagnosis not present

## 2020-04-26 DIAGNOSIS — R200 Anesthesia of skin: Secondary | ICD-10-CM | POA: Diagnosis not present

## 2020-04-26 DIAGNOSIS — R2689 Other abnormalities of gait and mobility: Secondary | ICD-10-CM | POA: Diagnosis not present

## 2020-04-26 DIAGNOSIS — M47812 Spondylosis without myelopathy or radiculopathy, cervical region: Secondary | ICD-10-CM | POA: Diagnosis not present

## 2020-04-27 ENCOUNTER — Telehealth: Payer: Self-pay

## 2020-04-27 NOTE — Telephone Encounter (Signed)
The office got an alert that the pt may not be compliant with her atrovastatin but Dr. Loma Newton is no longer a patient of the office she has moved to [place].

## 2020-05-09 IMAGING — DX DG CHEST 2V
2 series · 2 of 2 positions shown · non-contrast
Comparison: 12/02/2016

CLINICAL DATA: Recent cough

EXAM:
CHEST - 2 VIEW

[chest pa]
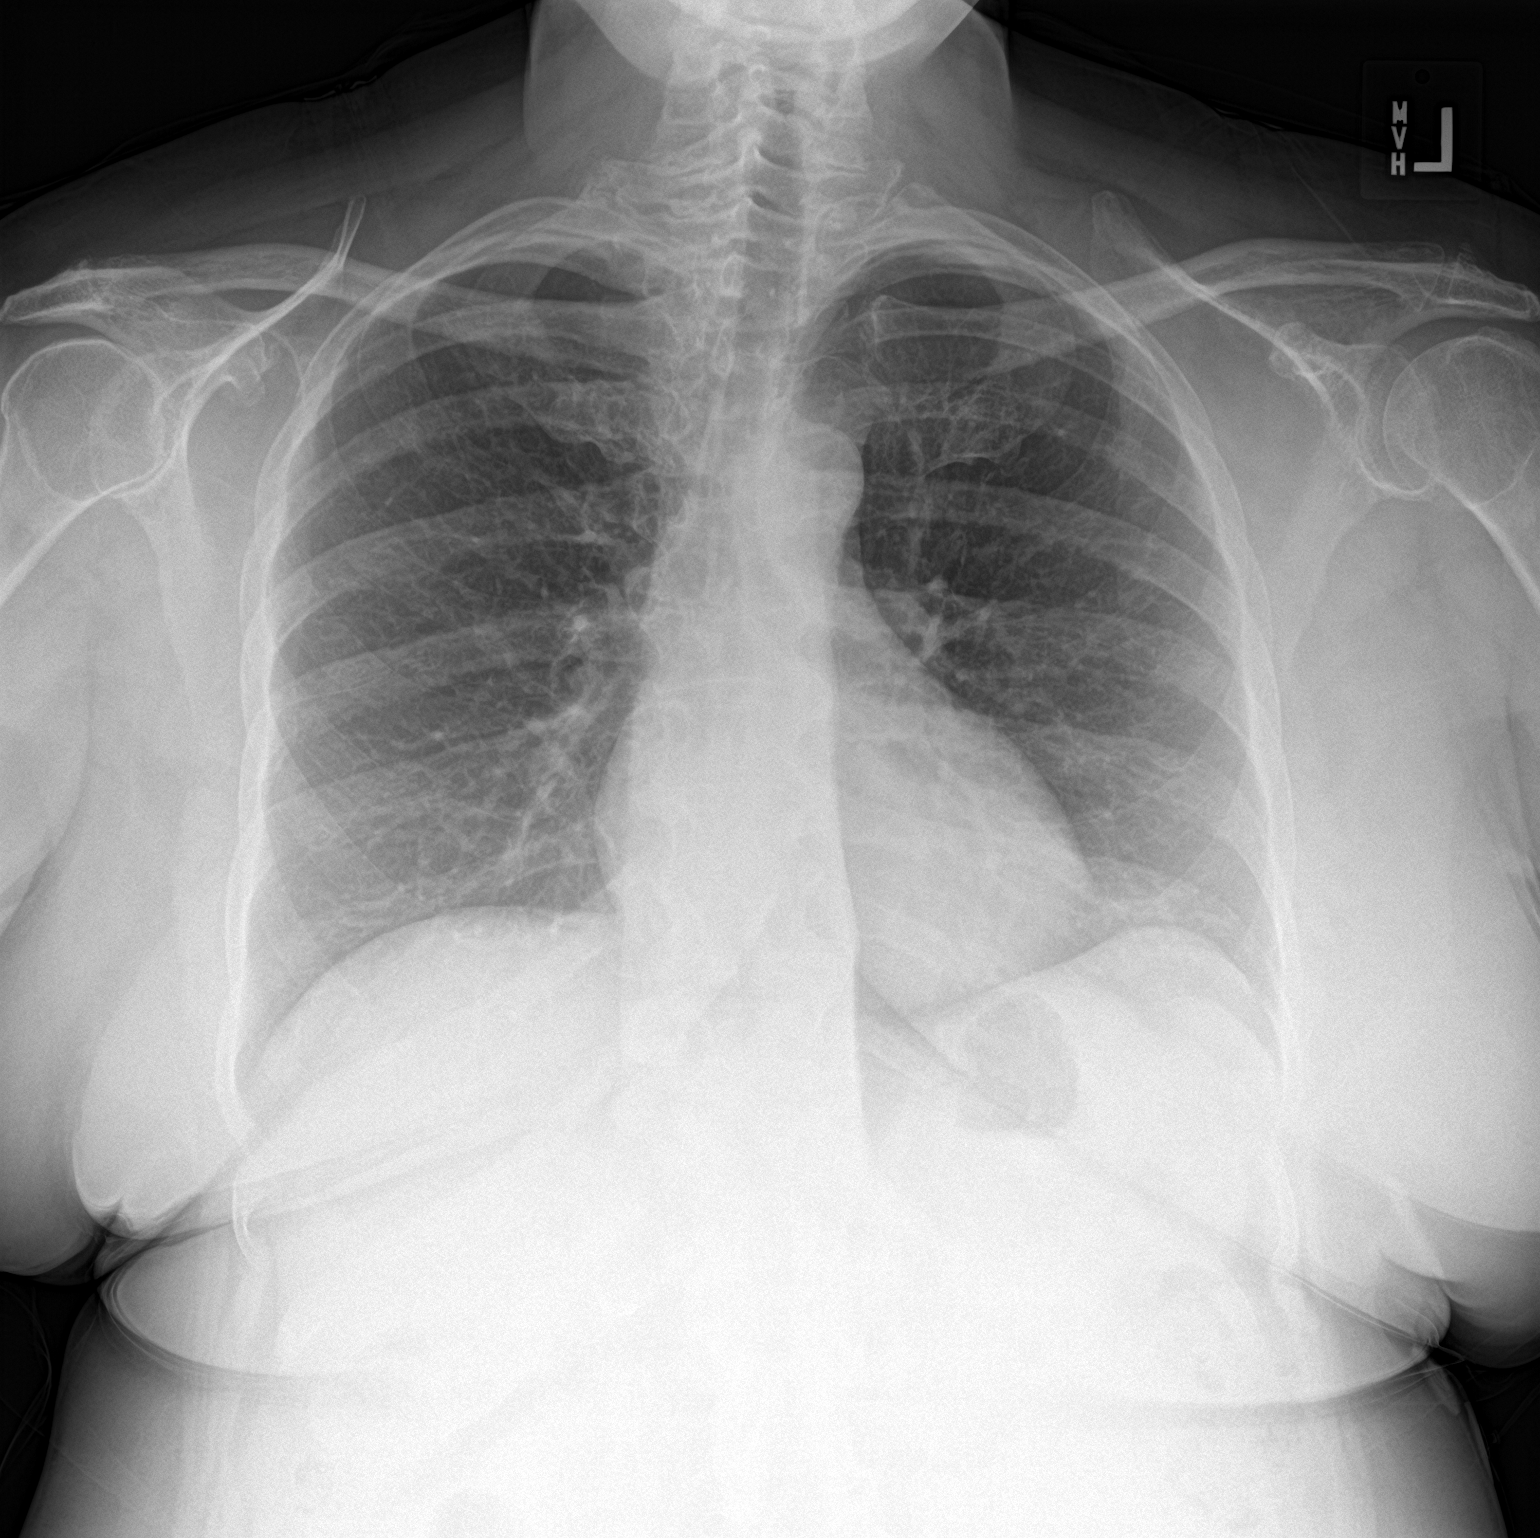

[chest lat]
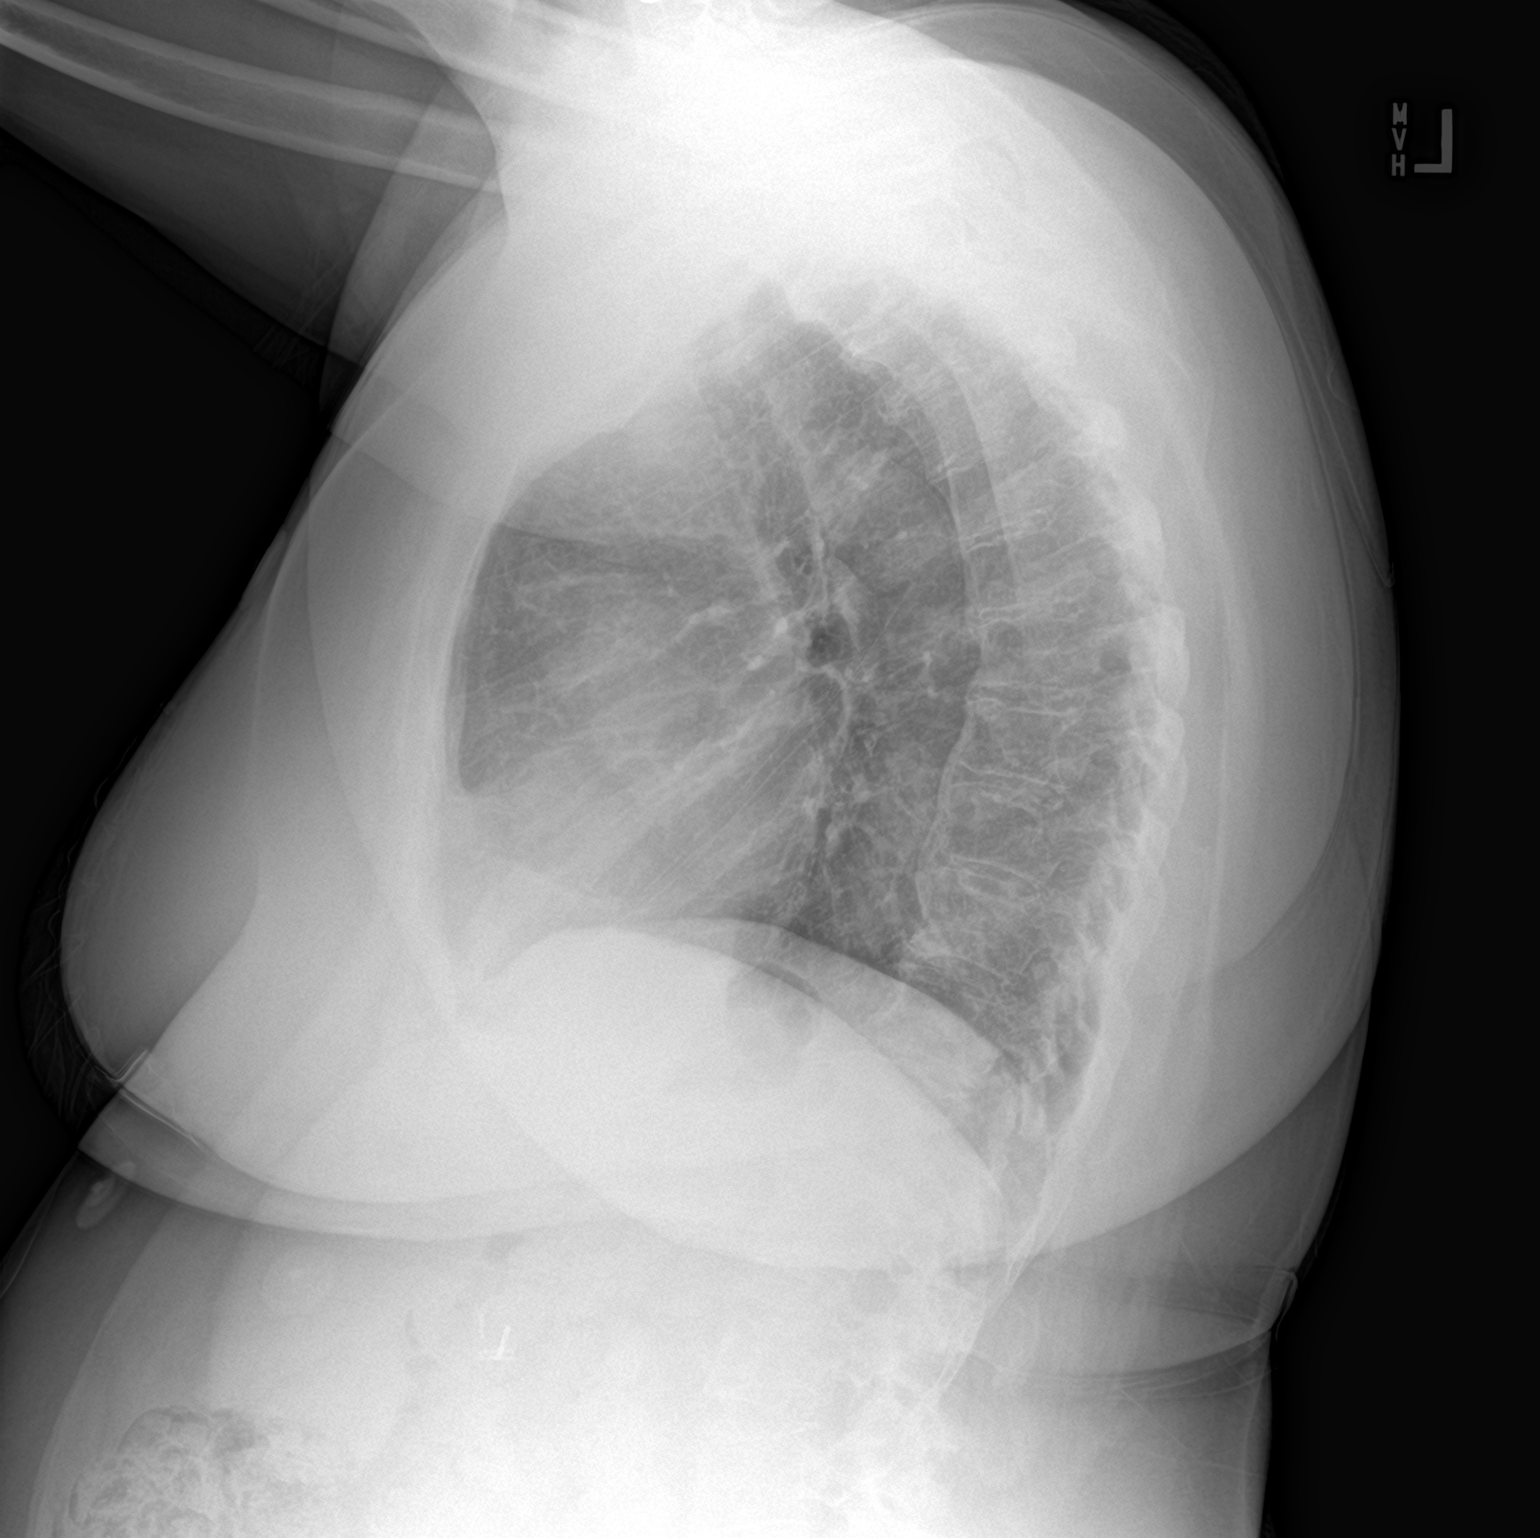

[2 of 2 positions shown; findings below may reference images not displayed]

FINDINGS: Normal mediastinum and cardiac silhouette. Normal pulmonary
vasculature. No evidence of effusion, infiltrate, or pneumothorax.
No acute bony abnormality.
IMPRESSION: No acute cardiopulmonary process.

## 2020-05-23 DIAGNOSIS — M47812 Spondylosis without myelopathy or radiculopathy, cervical region: Secondary | ICD-10-CM | POA: Diagnosis not present

## 2020-05-23 DIAGNOSIS — M5412 Radiculopathy, cervical region: Secondary | ICD-10-CM | POA: Diagnosis not present

## 2020-05-23 DIAGNOSIS — M47816 Spondylosis without myelopathy or radiculopathy, lumbar region: Secondary | ICD-10-CM | POA: Diagnosis not present

## 2020-05-24 DIAGNOSIS — E1165 Type 2 diabetes mellitus with hyperglycemia: Secondary | ICD-10-CM | POA: Diagnosis not present

## 2020-05-31 DIAGNOSIS — E1165 Type 2 diabetes mellitus with hyperglycemia: Secondary | ICD-10-CM | POA: Diagnosis not present

## 2020-06-01 DIAGNOSIS — E119 Type 2 diabetes mellitus without complications: Secondary | ICD-10-CM | POA: Diagnosis not present

## 2020-06-01 DIAGNOSIS — K760 Fatty (change of) liver, not elsewhere classified: Secondary | ICD-10-CM | POA: Diagnosis not present

## 2020-06-20 DIAGNOSIS — M47812 Spondylosis without myelopathy or radiculopathy, cervical region: Secondary | ICD-10-CM | POA: Diagnosis not present

## 2020-06-20 DIAGNOSIS — M47814 Spondylosis without myelopathy or radiculopathy, thoracic region: Secondary | ICD-10-CM | POA: Diagnosis not present

## 2020-06-20 DIAGNOSIS — M5412 Radiculopathy, cervical region: Secondary | ICD-10-CM | POA: Diagnosis not present

## 2020-06-22 DIAGNOSIS — R0789 Other chest pain: Secondary | ICD-10-CM | POA: Diagnosis not present

## 2020-07-26 DIAGNOSIS — R200 Anesthesia of skin: Secondary | ICD-10-CM | POA: Diagnosis not present

## 2020-07-26 DIAGNOSIS — M79602 Pain in left arm: Secondary | ICD-10-CM | POA: Diagnosis not present

## 2020-07-26 DIAGNOSIS — M542 Cervicalgia: Secondary | ICD-10-CM | POA: Diagnosis not present

## 2020-07-26 DIAGNOSIS — M546 Pain in thoracic spine: Secondary | ICD-10-CM | POA: Diagnosis not present

## 2020-07-26 DIAGNOSIS — M47812 Spondylosis without myelopathy or radiculopathy, cervical region: Secondary | ICD-10-CM | POA: Diagnosis not present

## 2020-07-26 DIAGNOSIS — R2689 Other abnormalities of gait and mobility: Secondary | ICD-10-CM | POA: Diagnosis not present

## 2020-07-26 DIAGNOSIS — M797 Fibromyalgia: Secondary | ICD-10-CM | POA: Diagnosis not present

## 2020-07-26 DIAGNOSIS — M47816 Spondylosis without myelopathy or radiculopathy, lumbar region: Secondary | ICD-10-CM | POA: Diagnosis not present

## 2020-07-26 DIAGNOSIS — R202 Paresthesia of skin: Secondary | ICD-10-CM | POA: Diagnosis not present

## 2020-07-27 DIAGNOSIS — K760 Fatty (change of) liver, not elsewhere classified: Secondary | ICD-10-CM | POA: Diagnosis not present

## 2020-07-27 DIAGNOSIS — Z9049 Acquired absence of other specified parts of digestive tract: Secondary | ICD-10-CM | POA: Diagnosis not present

## 2020-07-27 DIAGNOSIS — E119 Type 2 diabetes mellitus without complications: Secondary | ICD-10-CM | POA: Diagnosis not present

## 2020-08-30 DIAGNOSIS — E1165 Type 2 diabetes mellitus with hyperglycemia: Secondary | ICD-10-CM | POA: Diagnosis not present

## 2020-09-18 DIAGNOSIS — E1165 Type 2 diabetes mellitus with hyperglycemia: Secondary | ICD-10-CM | POA: Diagnosis not present

## 2020-09-20 DIAGNOSIS — J4550 Severe persistent asthma, uncomplicated: Secondary | ICD-10-CM | POA: Diagnosis not present

## 2020-09-20 DIAGNOSIS — J301 Allergic rhinitis due to pollen: Secondary | ICD-10-CM | POA: Diagnosis not present

## 2020-10-13 DIAGNOSIS — R9089 Other abnormal findings on diagnostic imaging of central nervous system: Secondary | ICD-10-CM | POA: Diagnosis not present

## 2020-10-13 DIAGNOSIS — Z23 Encounter for immunization: Secondary | ICD-10-CM | POA: Diagnosis not present

## 2020-10-13 DIAGNOSIS — R402362 Coma scale, best motor response, obeys commands, at arrival to emergency department: Secondary | ICD-10-CM | POA: Diagnosis not present

## 2020-10-13 DIAGNOSIS — W1830XA Fall on same level, unspecified, initial encounter: Secondary | ICD-10-CM | POA: Diagnosis not present

## 2020-10-13 DIAGNOSIS — R402252 Coma scale, best verbal response, oriented, at arrival to emergency department: Secondary | ICD-10-CM | POA: Diagnosis not present

## 2020-10-13 DIAGNOSIS — S0181XA Laceration without foreign body of other part of head, initial encounter: Secondary | ICD-10-CM | POA: Diagnosis not present

## 2020-10-13 DIAGNOSIS — S0101XA Laceration without foreign body of scalp, initial encounter: Secondary | ICD-10-CM | POA: Diagnosis not present

## 2020-10-13 DIAGNOSIS — R402142 Coma scale, eyes open, spontaneous, at arrival to emergency department: Secondary | ICD-10-CM | POA: Diagnosis not present

## 2020-10-13 DIAGNOSIS — S0990XA Unspecified injury of head, initial encounter: Secondary | ICD-10-CM | POA: Diagnosis not present

## 2020-10-13 DIAGNOSIS — Z7984 Long term (current) use of oral hypoglycemic drugs: Secondary | ICD-10-CM | POA: Diagnosis not present

## 2020-10-13 DIAGNOSIS — W228XXA Striking against or struck by other objects, initial encounter: Secondary | ICD-10-CM | POA: Diagnosis not present

## 2020-10-13 DIAGNOSIS — Z9989 Dependence on other enabling machines and devices: Secondary | ICD-10-CM | POA: Diagnosis not present

## 2020-10-13 DIAGNOSIS — E119 Type 2 diabetes mellitus without complications: Secondary | ICD-10-CM | POA: Diagnosis not present

## 2020-10-24 DIAGNOSIS — Z4802 Encounter for removal of sutures: Secondary | ICD-10-CM | POA: Diagnosis not present

## 2020-10-24 DIAGNOSIS — M47814 Spondylosis without myelopathy or radiculopathy, thoracic region: Secondary | ICD-10-CM | POA: Diagnosis not present

## 2020-10-24 DIAGNOSIS — M5412 Radiculopathy, cervical region: Secondary | ICD-10-CM | POA: Diagnosis not present

## 2020-10-24 DIAGNOSIS — M47812 Spondylosis without myelopathy or radiculopathy, cervical region: Secondary | ICD-10-CM | POA: Diagnosis not present

## 2020-10-25 DIAGNOSIS — G44311 Acute post-traumatic headache, intractable: Secondary | ICD-10-CM | POA: Diagnosis not present

## 2020-10-25 DIAGNOSIS — M542 Cervicalgia: Secondary | ICD-10-CM | POA: Diagnosis not present

## 2020-10-25 DIAGNOSIS — M797 Fibromyalgia: Secondary | ICD-10-CM | POA: Diagnosis not present

## 2020-10-25 DIAGNOSIS — M47816 Spondylosis without myelopathy or radiculopathy, lumbar region: Secondary | ICD-10-CM | POA: Diagnosis not present

## 2020-10-25 DIAGNOSIS — M79602 Pain in left arm: Secondary | ICD-10-CM | POA: Diagnosis not present

## 2020-10-25 DIAGNOSIS — M546 Pain in thoracic spine: Secondary | ICD-10-CM | POA: Diagnosis not present

## 2020-10-25 DIAGNOSIS — R202 Paresthesia of skin: Secondary | ICD-10-CM | POA: Diagnosis not present

## 2020-10-25 DIAGNOSIS — S0990XS Unspecified injury of head, sequela: Secondary | ICD-10-CM | POA: Diagnosis not present

## 2020-10-25 DIAGNOSIS — M47812 Spondylosis without myelopathy or radiculopathy, cervical region: Secondary | ICD-10-CM | POA: Diagnosis not present

## 2020-11-28 DIAGNOSIS — M5412 Radiculopathy, cervical region: Secondary | ICD-10-CM | POA: Diagnosis not present

## 2020-11-28 DIAGNOSIS — M47812 Spondylosis without myelopathy or radiculopathy, cervical region: Secondary | ICD-10-CM | POA: Diagnosis not present

## 2020-11-28 DIAGNOSIS — M47816 Spondylosis without myelopathy or radiculopathy, lumbar region: Secondary | ICD-10-CM | POA: Diagnosis not present

## 2020-12-06 DIAGNOSIS — E1165 Type 2 diabetes mellitus with hyperglycemia: Secondary | ICD-10-CM | POA: Diagnosis not present

## 2020-12-13 DIAGNOSIS — M79602 Pain in left arm: Secondary | ICD-10-CM | POA: Diagnosis not present

## 2020-12-13 DIAGNOSIS — M546 Pain in thoracic spine: Secondary | ICD-10-CM | POA: Diagnosis not present

## 2020-12-13 DIAGNOSIS — G43019 Migraine without aura, intractable, without status migrainosus: Secondary | ICD-10-CM | POA: Diagnosis not present

## 2020-12-13 DIAGNOSIS — S0990XS Unspecified injury of head, sequela: Secondary | ICD-10-CM | POA: Diagnosis not present

## 2020-12-13 DIAGNOSIS — M47812 Spondylosis without myelopathy or radiculopathy, cervical region: Secondary | ICD-10-CM | POA: Diagnosis not present

## 2020-12-13 DIAGNOSIS — R2689 Other abnormalities of gait and mobility: Secondary | ICD-10-CM | POA: Diagnosis not present

## 2020-12-13 DIAGNOSIS — M542 Cervicalgia: Secondary | ICD-10-CM | POA: Diagnosis not present

## 2020-12-13 DIAGNOSIS — M47816 Spondylosis without myelopathy or radiculopathy, lumbar region: Secondary | ICD-10-CM | POA: Diagnosis not present

## 2020-12-13 DIAGNOSIS — M797 Fibromyalgia: Secondary | ICD-10-CM | POA: Diagnosis not present

## 2020-12-25 DIAGNOSIS — E1165 Type 2 diabetes mellitus with hyperglycemia: Secondary | ICD-10-CM | POA: Diagnosis not present

## 2020-12-26 DIAGNOSIS — M5412 Radiculopathy, cervical region: Secondary | ICD-10-CM | POA: Diagnosis not present

## 2020-12-26 DIAGNOSIS — M47816 Spondylosis without myelopathy or radiculopathy, lumbar region: Secondary | ICD-10-CM | POA: Diagnosis not present

## 2020-12-26 DIAGNOSIS — M47812 Spondylosis without myelopathy or radiculopathy, cervical region: Secondary | ICD-10-CM | POA: Diagnosis not present

## 2020-12-27 DIAGNOSIS — S96912A Strain of unspecified muscle and tendon at ankle and foot level, left foot, initial encounter: Secondary | ICD-10-CM | POA: Diagnosis not present

## 2020-12-27 DIAGNOSIS — Z86711 Personal history of pulmonary embolism: Secondary | ICD-10-CM | POA: Diagnosis not present

## 2020-12-27 DIAGNOSIS — S96812A Strain of other specified muscles and tendons at ankle and foot level, left foot, initial encounter: Secondary | ICD-10-CM | POA: Diagnosis not present

## 2020-12-27 DIAGNOSIS — X501XXA Overexertion from prolonged static or awkward postures, initial encounter: Secondary | ICD-10-CM | POA: Diagnosis not present

## 2020-12-27 DIAGNOSIS — Z86718 Personal history of other venous thrombosis and embolism: Secondary | ICD-10-CM | POA: Diagnosis not present

## 2020-12-27 DIAGNOSIS — M25562 Pain in left knee: Secondary | ICD-10-CM | POA: Diagnosis not present

## 2020-12-27 DIAGNOSIS — M79662 Pain in left lower leg: Secondary | ICD-10-CM | POA: Diagnosis not present

## 2020-12-27 DIAGNOSIS — M79605 Pain in left leg: Secondary | ICD-10-CM | POA: Diagnosis not present

## 2020-12-27 DIAGNOSIS — Z7902 Long term (current) use of antithrombotics/antiplatelets: Secondary | ICD-10-CM | POA: Diagnosis not present

## 2020-12-27 DIAGNOSIS — R600 Localized edema: Secondary | ICD-10-CM | POA: Diagnosis not present

## 2021-01-09 DIAGNOSIS — M79602 Pain in left arm: Secondary | ICD-10-CM | POA: Diagnosis not present

## 2021-01-09 DIAGNOSIS — M47812 Spondylosis without myelopathy or radiculopathy, cervical region: Secondary | ICD-10-CM | POA: Diagnosis not present

## 2021-01-09 DIAGNOSIS — M47816 Spondylosis without myelopathy or radiculopathy, lumbar region: Secondary | ICD-10-CM | POA: Diagnosis not present

## 2021-01-09 DIAGNOSIS — M546 Pain in thoracic spine: Secondary | ICD-10-CM | POA: Diagnosis not present

## 2021-01-09 DIAGNOSIS — S0990XS Unspecified injury of head, sequela: Secondary | ICD-10-CM | POA: Diagnosis not present

## 2021-01-09 DIAGNOSIS — M797 Fibromyalgia: Secondary | ICD-10-CM | POA: Diagnosis not present

## 2021-01-09 DIAGNOSIS — M542 Cervicalgia: Secondary | ICD-10-CM | POA: Diagnosis not present

## 2021-01-09 DIAGNOSIS — R202 Paresthesia of skin: Secondary | ICD-10-CM | POA: Diagnosis not present

## 2021-01-09 DIAGNOSIS — G43019 Migraine without aura, intractable, without status migrainosus: Secondary | ICD-10-CM | POA: Diagnosis not present

## 2021-01-16 DIAGNOSIS — E119 Type 2 diabetes mellitus without complications: Secondary | ICD-10-CM | POA: Diagnosis not present

## 2021-01-16 DIAGNOSIS — M47812 Spondylosis without myelopathy or radiculopathy, cervical region: Secondary | ICD-10-CM | POA: Diagnosis not present

## 2021-01-16 DIAGNOSIS — M5412 Radiculopathy, cervical region: Secondary | ICD-10-CM | POA: Diagnosis not present

## 2021-01-16 DIAGNOSIS — J45909 Unspecified asthma, uncomplicated: Secondary | ICD-10-CM | POA: Diagnosis not present

## 2021-01-16 DIAGNOSIS — M4802 Spinal stenosis, cervical region: Secondary | ICD-10-CM | POA: Diagnosis not present

## 2021-01-24 DIAGNOSIS — M5412 Radiculopathy, cervical region: Secondary | ICD-10-CM | POA: Diagnosis not present

## 2021-01-24 DIAGNOSIS — M47812 Spondylosis without myelopathy or radiculopathy, cervical region: Secondary | ICD-10-CM | POA: Diagnosis not present

## 2021-01-24 DIAGNOSIS — M47816 Spondylosis without myelopathy or radiculopathy, lumbar region: Secondary | ICD-10-CM | POA: Diagnosis not present

## 2021-03-20 DIAGNOSIS — S0990XS Unspecified injury of head, sequela: Secondary | ICD-10-CM | POA: Diagnosis not present

## 2021-03-20 DIAGNOSIS — M542 Cervicalgia: Secondary | ICD-10-CM | POA: Diagnosis not present

## 2021-03-20 DIAGNOSIS — R42 Dizziness and giddiness: Secondary | ICD-10-CM | POA: Diagnosis not present

## 2021-03-20 DIAGNOSIS — G44321 Chronic post-traumatic headache, intractable: Secondary | ICD-10-CM | POA: Diagnosis not present

## 2021-03-20 DIAGNOSIS — M47812 Spondylosis without myelopathy or radiculopathy, cervical region: Secondary | ICD-10-CM | POA: Diagnosis not present

## 2021-03-20 DIAGNOSIS — R413 Other amnesia: Secondary | ICD-10-CM | POA: Diagnosis not present

## 2021-03-20 DIAGNOSIS — M47816 Spondylosis without myelopathy or radiculopathy, lumbar region: Secondary | ICD-10-CM | POA: Diagnosis not present

## 2021-03-20 DIAGNOSIS — M546 Pain in thoracic spine: Secondary | ICD-10-CM | POA: Diagnosis not present

## 2021-03-20 DIAGNOSIS — G9340 Encephalopathy, unspecified: Secondary | ICD-10-CM | POA: Diagnosis not present

## 2021-03-21 DIAGNOSIS — J4530 Mild persistent asthma, uncomplicated: Secondary | ICD-10-CM | POA: Diagnosis not present

## 2021-03-21 DIAGNOSIS — J301 Allergic rhinitis due to pollen: Secondary | ICD-10-CM | POA: Diagnosis not present

## 2021-04-05 DIAGNOSIS — R413 Other amnesia: Secondary | ICD-10-CM | POA: Diagnosis not present

## 2021-04-05 DIAGNOSIS — G9340 Encephalopathy, unspecified: Secondary | ICD-10-CM | POA: Diagnosis not present

## 2021-04-11 DIAGNOSIS — E1165 Type 2 diabetes mellitus with hyperglycemia: Secondary | ICD-10-CM | POA: Diagnosis not present

## 2021-04-25 DIAGNOSIS — E1165 Type 2 diabetes mellitus with hyperglycemia: Secondary | ICD-10-CM | POA: Diagnosis not present

## 2021-06-28 DIAGNOSIS — E119 Type 2 diabetes mellitus without complications: Secondary | ICD-10-CM | POA: Diagnosis not present

## 2021-08-10 ENCOUNTER — Other Ambulatory Visit: Payer: Self-pay

## 2021-08-10 ENCOUNTER — Encounter (HOSPITAL_BASED_OUTPATIENT_CLINIC_OR_DEPARTMENT_OTHER): Payer: Self-pay | Admitting: Nurse Practitioner

## 2021-08-10 ENCOUNTER — Telehealth (INDEPENDENT_AMBULATORY_CARE_PROVIDER_SITE_OTHER): Payer: Medicare PPO | Admitting: Nurse Practitioner

## 2021-08-10 VITALS — Ht 61.0 in | Wt 158.0 lb

## 2021-08-10 DIAGNOSIS — R5383 Other fatigue: Secondary | ICD-10-CM

## 2021-08-10 DIAGNOSIS — L0232 Furuncle of buttock: Secondary | ICD-10-CM

## 2021-08-10 DIAGNOSIS — J312 Chronic pharyngitis: Secondary | ICD-10-CM | POA: Diagnosis not present

## 2021-08-10 DIAGNOSIS — Z7689 Persons encountering health services in other specified circumstances: Secondary | ICD-10-CM | POA: Insufficient documentation

## 2021-08-10 DIAGNOSIS — E1165 Type 2 diabetes mellitus with hyperglycemia: Secondary | ICD-10-CM

## 2021-08-10 DIAGNOSIS — M797 Fibromyalgia: Secondary | ICD-10-CM | POA: Diagnosis not present

## 2021-08-10 DIAGNOSIS — R519 Headache, unspecified: Secondary | ICD-10-CM | POA: Diagnosis not present

## 2021-08-10 DIAGNOSIS — L709 Acne, unspecified: Secondary | ICD-10-CM | POA: Diagnosis not present

## 2021-08-10 DIAGNOSIS — R413 Other amnesia: Secondary | ICD-10-CM

## 2021-08-10 DIAGNOSIS — G8929 Other chronic pain: Secondary | ICD-10-CM

## 2021-08-10 HISTORY — DX: Furuncle of buttock: L02.32

## 2021-08-10 HISTORY — DX: Acne, unspecified: L70.9

## 2021-08-10 MED ORDER — FREESTYLE LIBRE 2 SENSOR MISC: 11 refills | Status: DC

## 2021-08-10 MED ORDER — FAMOTIDINE 20 MG PO TABS: 20.0000 mg | ORAL_TABLET | Freq: Two times a day (BID) | ORAL | 11 refills | Status: DC

## 2021-08-10 NOTE — Patient Instructions (Addendum)
Recommendations from today's visit: I recommend that you increase your protein intake to help keep your blood sugars more stable.  I recommend that you try famotidine twice a day to see if this helps with the sore throat- I believe this is caused by something called "silent reflux" that causes chronic sore throat.  We will follow-up in 4 weeks for labs and to continue going over your chronic issues.   Information on diet, exercise, and health maintenance recommendations are listed below. This is information to help you be sure you are on track for optimal health and monitoring.   Please look over this and let us know if you have any questions or if you have completed any of the health maintenance outside of [place] so that we can be sure your records are up to date.  ___________________________________________________________  Thank you for choosing [facility] for your Primary Care needs. I am excited for the opportunity to partner with you to meet your health care goals. It was a pleasure meeting you today!  I am an Adult-Geriatric Nurse Practitioner with a background in caring for patients for more than 20 years. I provide primary care and sports medicine services to patients age 8 and older within this office. I am also the director of the APP Fellowship with [facility].   I am passionate about providing the best service to you through preventive medicine and supportive care. I consider you a part of the medical team and value your input. I work diligently to ensure that you are heard and your needs are met in a safe and effective manner. I want you to feel comfortable with me as your provider and want you to know that your health concerns are important to me.  For your information, our office hours are Monday- Friday 8:00 AM - 5:00 PM At this time I am not in the office on Wednesdays.  If you have questions or concerns, please call our office at  [phone] or send [place] a MyChart message and we will respond as quickly as possible.   For all urgent or time sensitive needs we ask that you please call the office to avoid delays. MyChart is not constantly monitored and replies may take up to 72 business hours.  MyChart Policy: MyChart allows for you to see your visit notes, after visit summary, provider recommendations, lab and tests results, make an appointment, request refills, and contact your provider or the office for non-urgent questions or concerns. Providers are seeing patients during normal business hours and do not have built in time to review MyChart messages.  We ask that you allow a minimum of 4 business days for responses to [employer]. For this reason, please do not send urgent requests through [place]. Please call the office at [phone]. Complex MyChart concerns may require a visit. Your provider may request you schedule a virtual or in person visit to ensure we are providing the best care possible. MyChart messages sent after 4:00 PM on Friday will not be received by the provider until Monday morning.    Lab and Test Results: You will receive your lab and test results on MyChart as soon as they are completed and results have been sent by the lab or testing facility. Due to this service, you will receive your results BEFORE your provider.  I review lab and tests results each morning prior to seeing patients. Some results require collaboration with other providers to ensure you  are receiving the most appropriate care. For this reason, we ask that you please allow a minimum of 4 business days for your provider to receive and review lab and test results and contact you about these.  Most lab and test result comments from the provider will be sent through [place]. Your provider may recommend changes to the plan of care, follow-up visits, repeat testing, ask questions, or request an office visit to discuss these results. You may  reply directly to this message or call the office at [phone] to provide information for the provider or set up an appointment. In some instances, you will be called with test results and recommendations. Please let us know if this is preferred and we will make note of this in your chart to provide this for you.    If you have not heard a response to your lab or test results in 72 business hours, please call the office to let us know.   After Hours: For all non-emergency after hours needs, please call the office at [phone] and select the option to reach the on-call provider service. On-call services are shared between multiple [place] offices and therefore it will not be possible to speak directly with your provider. On-call providers may provide medical advice and recommendations, but are unable to provide refills for maintenance medications.  For all emergency or urgent medical needs after normal business hours, we recommend that you seek care at the closest Urgent Care or Emergency Department to ensure appropriate treatment in a timely manner.  [facility] [place] has a 24 hour emergency room located on the ground floor for your convenience.    Please do not hesitate to reach out to [place] with concerns.   Thank you, again, for choosing me as your health care partner. I appreciate your trust and look forward to learning more about you.   Worthy Keeler, DNP, AGNP-c ___________________________________________________________  Health Maintenance Recommendations Screening Testing Mammogram Every 1 -2 years based on history and risk factors Starting at age 75 Pap Smear Ages 21-39 every 3 years Ages 42-65 every 5 years with HPV testing More frequent testing may be required based on results and history Colon Cancer Screening Every 1-10 years based on test performed, risk factors, and history Starting at age 32 Bone Density Screening Every 2-10 years based on  history Starting at age 39 for women Recommendations for men differ based on medication usage, history, and risk factors AAA Screening One time ultrasound Men 15-50 years old who have every smoked Lung Cancer Screening Low Dose Lung CT every 12 months Age 60-80 years with a 30 pack-year smoking history who still smoke or who have quit within the last 15 years  Screening Labs Routine  Labs: Complete Blood Count (CBC), Complete Metabolic Panel (CMP), Cholesterol (Lipid Panel) Every 6-12 months based on history and medications May be recommended more frequently based on current conditions or previous results Hemoglobin A1c Lab Every 3-12 months based on history and previous results Starting at age 57 or earlier with diagnosis of diabetes, high cholesterol, BMI >26, and/or risk factors Frequent monitoring for patients with diabetes to ensure blood sugar control Thyroid Panel (TSH w/ T3 & T4) Every 6 months based on history, symptoms, and risk factors May be repeated more often if on medication HIV One time testing for all patients 45 and older May be repeated more frequently for patients with increased risk factors or exposure Hepatitis C One time testing for all patients 42  and older May be repeated more frequently for patients with increased risk factors or exposure Gonorrhea, Chlamydia Every 12 months for all sexually active persons 13-24 years Additional monitoring may be recommended for those who are considered high risk or who have symptoms PSA Men 56-16 years old with risk factors Additional screening may be recommended from age 47-69 based on risk factors, symptoms, and history  Vaccine Recommendations Tetanus Booster All adults every 10 years Flu Vaccine All patients 6 months and older every year COVID Vaccine All patients 12 years and older Initial dosing with booster May recommend additional booster based on age and health history HPV Vaccine 2 doses all patients  age 17-26 Dosing may be considered for patients over 26 Shingles Vaccine (Shingrix) 2 doses all adults 81 years and older Pneumonia (Pneumovax 23) All adults 32 years and older May recommend earlier dosing based on health history Pneumonia (Prevnar 34) All adults 66 years and older Dosed 1 year after Pneumovax 23  Additional Screening, Testing, and Vaccinations may be recommended on an individualized basis based on family history, health history, risk factors, and/or exposure.  __________________________________________________________  Diet Recommendations for All Patients  I recommend that all patients maintain a diet low in saturated fats, carbohydrates, and cholesterol. While this can be challenging at first, it is not impossible and small changes can make big differences.  Things to try: Decreasing the amount of soda, sweet tea, and/or juice to one or less per day and replace with water While water is always the first choice, if you do not like water you may consider adding a water additive without sugar to improve the taste other sugar free drinks Replace potatoes with a brightly colored vegetable at dinner Use healthy oils, such as canola oil or olive oil, instead of butter or hard margarine Limit your bread intake to two pieces or less a day Replace regular pasta with low carb pasta options Bake, broil, or grill foods instead of frying Monitor portion sizes  Eat smaller, more frequent meals throughout the day instead of large meals  An important thing to remember is, if you love foods that are not great for your health, you don't have to give them up completely. Instead, allow these foods to be a reward when you have done well. Allowing yourself to still have special treats every once in a while is a nice way to tell yourself thank you for working hard to keep yourself healthy.   Also remember that every day is a new day. If you have a bad day and "fall off the wagon", you can  still climb right back up and keep moving along on your journey!  We have resources available to help you!  Some websites that may be helpful include: www.http://carter.biz/  Www.VeryWellFit.com _____________________________________________________________  Activity Recommendations for All Patients  I recommend that all adults get at least 20 minutes of moderate physical activity that elevates your heart rate at least 5 days out of the week.  Some examples include: Walking or jogging at a pace that allows you to carry on a conversation Cycling (stationary bike or outdoors) Water aerobics Yoga Weight lifting Dancing If physical limitations prevent you from putting stress on your joints, exercise in a pool or seated in a chair are excellent options.  Do determine your MAXIMUM heart rate for activity: YOUR AGE - 220 = MAX HeartRate   Remember! Do not push yourself too hard.  Start slowly and build up your pace, speed, weight, time  in exercise, etc.  Allow your body to rest between exercise and get good sleep. You will need more water than normal when you are exerting yourself. Do not wait until you are thirsty to drink. Drink with a purpose of getting in at least 8, 8 ounce glasses of water a day plus more depending on how much you exercise and sweat.    If you begin to develop dizziness, chest pain, abdominal pain, jaw pain, shortness of breath, headache, vision changes, lightheadedness, or other concerning symptoms, stop the activity and allow your body to rest. If your symptoms are severe, seek emergency evaluation immediately. If your symptoms are concerning, but not severe, please let us know so that we can recommend further evaluation.   ________________________________________________________________

## 2021-08-10 NOTE — Progress Notes (Signed)
Virtual Visit Encounter mychart visit.   I connected with  Pam [name] on 08/10/21 at  9:30 AM EDT by secure audio and/or video enabled telemedicine application. I verified that I am speaking with the correct person using two identifiers.   I introduced myself as a Designer, jewellery with the practice. The limitations of evaluation and management by telemedicine discussed with the patient and the availability of in person appointments. The patient expressed verbal understanding and consent to proceed.  Participating parties in this visit include: Myself and patient  The patient is: Patient Location: Home I am: Provider Location: Office/Clinic Subjective:    CC and HPI: Pam [name] is a 70 y.o. year old female presenting for new patient visit. Virtual visit performed today due to weather concerns with travel.   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 She previous lived in the [place] area for many years, but relocated to [facility] 2 years ago. She has been in [place] again for 3 months.   She endorses several concerns today: DM2 Freestyle libre monitoring system- average blood sugar readings from last several weeks at 103 Ozempic 0.5mg  dose Most recent A1c earlier this year in 6% range Has implemented dietary changes to include a primarily pescatarian diet with carbohydrates limited to 100g per day. Exercises regularly at home with silver sneakers program.  Working to increase amount. Does have occasional low at night (mid to upper 50's) without symptoms No new symptoms  Fibromyalgia Medication and symptom management in the past with Neurology and Pain Management Not taking anything scheduled at this time Has had several recent exacerbations of symptoms including headaches, joint pain, and back pain.  Not able to manage well on her own and causing QOL issues She would like a referral today for evaluation with neurology  Acne Reports acne like  outbreak on back and shoulders that is chronic- areas are scarring as they heal Using gentle cleansers and lotions on these areas with no change She would like a referral to dermatology for evaluation  Boil Endorses intermittent "pimple-like boil" in the gluteal cleft Recurrent in nature for last several years Unsure of what is causing repeated occurrence.  Cleanses area well- has not taken anything for this or tried anything in the past She would like this evaluated.   Sore throat Endorses chronic sore throat since starting Ozempic Denies reflux symptoms or allergy symptoms Reports "throat feels raw" and "swallowing is difficult"  Decreased Energy Previously was taking vitamin supplementation, but is now only on multivitamin.  Unable to completely discuss all issues thoroughly due to time limitations of the visit.   Past medical history, Surgical history, Family history not pertinant except as noted below, Social history, Allergies, and medications have been entered into the medical record, reviewed, and corrections made. Will plan for further review at future visits.   Review of Systems:  All review of systems negative except what is listed in the HPI  Objective:    Alert and oriented x 4 Speaking in clear sentences with no shortness of breath. No distress.  Impression and Recommendations:    Problem List Items Addressed This Visit     Fibromyalgia    Increased pain, fatigue, and headaches in the setting of fibromyalgia and no current medications Concerns present for past diagnosis of OSA with no mention of current treatment. Given the amount of concerns presented today, unable to fully evaluate current state Have sent referral to neurology at patient request. May need  additional evaluation for OSA Will plan to evaluate further at future visits      Relevant Orders   Ambulatory referral to Neurology   Headache    See fibromyalgia       Relevant Orders    Ambulatory referral to Neurology   Type II diabetes mellitus, uncontrolled ([place]) - Primary    Chronic  Reported recent labs WNL- due for repeat labs Reported hypoglycemia unclear if this is a sensor issue during sleep vs actual low BG Recommend increased protein intake to help keep BG stable She is doing fantastic on Ozempic and I feel the benefits are too good at this point to stop without knowing true readings or reviewing the data from libreview Attempt to connect with libreview not successful today, but will work to do that in office at next visit.  Recommend continue Ozempic at 0.5mg  dose weekly with CGM Will f/u with labs in the next few weeks and in person visit.       Relevant Medications   Continuous Blood Gluc Sensor (FREESTYLE LIBRE 2 SENSOR) MISC   Chronic sore throat    Strongly suspect symptoms related to silent reflux given onset at start of ozempic Unable to evaluate in person today, but will plan to monitor labs in near future Alternative concerns include thyroid enlargement in the setting of GLP-1, but no concerns for mass, lump, fevers, chills were present today. Given high volume of concerns presented today, limited time could be spent on each concern.  Will plan for detailed follow-up over next several months to address concerns. Start on famotidine BID today and will evaluate sore throat at visit in a few weeks for improvement      Relevant Medications   famotidine (PEPCID) 20 MG tablet   Acne    Acne like papules on back and chest with concerns for scarring Patient would like to see dermatology for this for further evaluation and recommendations Referral placed today      Relevant Orders   Ambulatory referral to Dermatology   Boil of buttock    Suspect pilonidal cyst based on patients description. Unable to rule out recurrent abscess. No symptoms at this time Discussed with patient that we can evaluate at office visit and make recommendations Treatment with  short term antibiotics vs lancing may be needed Reassurance that hygiene is not a factor in cyst production or recurrence Will evaluate in person at follow-up      Fatigue    Increased fatigue in the setting of OSA, fibromyalgia, and recent life changes We will check labs at next visit for further evaluation Suspect symptoms may be multifactorial given her concerns.  She has not had primary care in several years and has several significant health concerns that need address, all which could exacerbate fatigue.  F/U in a few weeks for labs and evaluation       Encounter to establish care with new doctor    Review of current and past medical history, social history, medication, and family history.  Review of care gaps and health maintenance recommendations.  Records from recent providers to be requested if not available in Chart Review or Care Everywhere.  Recommendations for health maintenance, diet, and exercise provided.  Unable to get to all concerns at visit today despite 60 minute visit. Will need frequent follow-ups with specific goals for each visit to appropriately discuss and assess needs.  Labs today: none HM Recommendations: multiple- no PC in several years CPE due: now- will address  once acute and chronic issues have been addressed        Memory loss   Relevant Orders   Ambulatory referral to Neurology     I discussed the assessment and treatment plan with the patient. The patient was provided an opportunity to ask questions and all were answered. The patient agreed with the plan and demonstrated an understanding of the instructions.   The patient was advised to call back or seek an in-person evaluation if the symptoms worsen or if the condition fails to improve as anticipated.  Follow-Up: few weeks for chronic care management  I provided 80 minutes of non-face-to-face interaction with this non face-to-face encounter including intake, same-day documentation, and chart  review.   Orma Render, NP , DNP, AGNP-c [place] [facility] [phone] (fax)

## 2021-08-10 NOTE — Assessment & Plan Note (Signed)
Chronic  Reported recent labs WNL- due for repeat labs Reported hypoglycemia unclear if this is a sensor issue during sleep vs actual low BG Recommend increased protein intake to help keep BG stable She is doing fantastic on Ozempic and I feel the benefits are too good at this point to stop without knowing true readings or reviewing the data from libreview Attempt to connect with libreview not successful today, but will work to do that in office at next visit.  Recommend continue Ozempic at 0.5mg  dose weekly with CGM Will f/u with labs in the next few weeks and in person visit.

## 2021-08-10 NOTE — Assessment & Plan Note (Signed)
See fibromyalgia

## 2021-08-10 NOTE — Assessment & Plan Note (Signed)
Increased fatigue in the setting of OSA, fibromyalgia, and recent life changes We will check labs at next visit for further evaluation Suspect symptoms may be multifactorial given her concerns.  She has not had primary care in several years and has several significant health concerns that need address, all which could exacerbate fatigue.  F/U in a few weeks for labs and evaluation

## 2021-08-10 NOTE — Assessment & Plan Note (Signed)
Increased pain, fatigue, and headaches in the setting of fibromyalgia and no current medications Concerns present for past diagnosis of OSA with no mention of current treatment. Given the amount of concerns presented today, unable to fully evaluate current state Have sent referral to neurology at patient request. May need additional evaluation for OSA Will plan to evaluate further at future visits

## 2021-08-10 NOTE — Assessment & Plan Note (Signed)
Strongly suspect symptoms related to silent reflux given onset at start of ozempic Unable to evaluate in person today, but will plan to monitor labs in near future Alternative concerns include thyroid enlargement in the setting of GLP-1, but no concerns for mass, lump, fevers, chills were present today. Given high volume of concerns presented today, limited time could be spent on each concern.  Will plan for detailed follow-up over next several months to address concerns. Start on famotidine BID today and will evaluate sore throat at visit in a few weeks for improvement

## 2021-08-10 NOTE — Assessment & Plan Note (Signed)
Review of current and past medical history, social history, medication, and family history.  Review of care gaps and health maintenance recommendations.  Records from recent providers to be requested if not available in Chart Review or Care Everywhere.  Recommendations for health maintenance, diet, and exercise provided.  Unable to get to all concerns at visit today despite 60 minute visit. Will need frequent follow-ups with specific goals for each visit to appropriately discuss and assess needs.  Labs today: none HM Recommendations: multiple- no PC in several years CPE due: now- will address once acute and chronic issues have been addressed

## 2021-08-10 NOTE — Assessment & Plan Note (Signed)
Acne like papules on back and chest with concerns for scarring Patient would like to see dermatology for this for further evaluation and recommendations Referral placed today

## 2021-08-10 NOTE — Assessment & Plan Note (Signed)
Suspect pilonidal cyst based on patients description. Unable to rule out recurrent abscess. No symptoms at this time Discussed with patient that we can evaluate at office visit and make recommendations Treatment with short term antibiotics vs lancing may be needed Reassurance that hygiene is not a factor in cyst production or recurrence Will evaluate in person at follow-up

## 2021-08-23 ENCOUNTER — Ambulatory Visit: Payer: Medicare PPO | Admitting: Physician Assistant

## 2021-09-11 ENCOUNTER — Encounter (HOSPITAL_BASED_OUTPATIENT_CLINIC_OR_DEPARTMENT_OTHER): Payer: Self-pay | Admitting: Nurse Practitioner

## 2021-09-11 ENCOUNTER — Other Ambulatory Visit: Payer: Self-pay

## 2021-09-11 ENCOUNTER — Ambulatory Visit: Payer: Medicare PPO | Attending: Internal Medicine

## 2021-09-11 ENCOUNTER — Other Ambulatory Visit (HOSPITAL_BASED_OUTPATIENT_CLINIC_OR_DEPARTMENT_OTHER): Payer: Self-pay

## 2021-09-11 ENCOUNTER — Ambulatory Visit (HOSPITAL_BASED_OUTPATIENT_CLINIC_OR_DEPARTMENT_OTHER): Payer: Medicare PPO | Admitting: Nurse Practitioner

## 2021-09-11 VITALS — BP 132/60 | HR 68 | Ht 61.0 in | Wt 161.0 lb

## 2021-09-11 DIAGNOSIS — M797 Fibromyalgia: Secondary | ICD-10-CM | POA: Diagnosis not present

## 2021-09-11 DIAGNOSIS — G8929 Other chronic pain: Secondary | ICD-10-CM

## 2021-09-11 DIAGNOSIS — R519 Headache, unspecified: Secondary | ICD-10-CM | POA: Diagnosis not present

## 2021-09-11 DIAGNOSIS — Z87828 Personal history of other (healed) physical injury and trauma: Secondary | ICD-10-CM | POA: Diagnosis not present

## 2021-09-11 DIAGNOSIS — M542 Cervicalgia: Secondary | ICD-10-CM | POA: Diagnosis not present

## 2021-09-11 DIAGNOSIS — R5383 Other fatigue: Secondary | ICD-10-CM | POA: Diagnosis not present

## 2021-09-11 DIAGNOSIS — I10 Essential (primary) hypertension: Secondary | ICD-10-CM

## 2021-09-11 DIAGNOSIS — M3320 Polymyositis, organ involvement unspecified: Secondary | ICD-10-CM

## 2021-09-11 DIAGNOSIS — E7800 Pure hypercholesterolemia, unspecified: Secondary | ICD-10-CM

## 2021-09-11 DIAGNOSIS — E1165 Type 2 diabetes mellitus with hyperglycemia: Secondary | ICD-10-CM | POA: Diagnosis not present

## 2021-09-11 DIAGNOSIS — Z23 Encounter for immunization: Secondary | ICD-10-CM

## 2021-09-11 DIAGNOSIS — H532 Diplopia: Secondary | ICD-10-CM | POA: Insufficient documentation

## 2021-09-11 DIAGNOSIS — E118 Type 2 diabetes mellitus with unspecified complications: Secondary | ICD-10-CM

## 2021-09-11 MED ORDER — MODERNA COVID-19 BIVAL BOOSTER 50 MCG/0.5ML IM SUSP
INTRAMUSCULAR | 0 refills | Status: DC
  Filled 2021-09-11: qty 0.5, 1d supply, fill #0

## 2021-09-11 MED ORDER — OZEMPIC (0.25 OR 0.5 MG/DOSE) 2 MG/1.5ML ~~LOC~~ SOPN: PEN_INJECTOR | SUBCUTANEOUS | 3 refills | Status: DC

## 2021-09-11 MED ORDER — DULOXETINE HCL 20 MG PO CPEP: ORAL_CAPSULE | ORAL | 3 refills | Status: DC

## 2021-09-11 MED ORDER — FREESTYLE LIBRE 2 SENSOR MISC: 11 refills | Status: DC

## 2021-09-11 NOTE — Assessment & Plan Note (Addendum)
Current fibromyalgia flare possibly related to increased stressors and change in environment since [place]. Previously she has been managed well with duloxetine and gabapentin. She is agreeable to restart duloxetine today.  Review of previous records show that she was up to 60 mg at 1 point. We will start at 20 mg and plan to increase to 30 mg after 14 days of use and then remonitor for effectiveness of that dosing. Consider gabapentin if not well controlled with max cymbalta dosing.
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
## 2021-09-11 NOTE — Patient Instructions (Addendum)
I have placed the referral for nuerology for you urgently. They will call to schedule this in the near future. If you have not heard from them by Friday morning, please let us know.  I have told them that your eye doctor recommended a MRI for further evaluation.    They will be able to evaluate the headaches and may be able to help with the vision changes or make recommendations.   We will plan to start cymbalta today for the pain.  I have sent 20mg  to take by mouth at bedtime for 2 weeks then increase to 30mg  tabs. We can increase more if needed, but this is a good start.  If this is not helpful after 4 weeks, please let me know and we will make adjustments as needed.   If you do want a referral for therapy, acupuncture, or dry needling let me know. We can send this in for you at any time.   We will check labs today and let you know if we need to make any changes based on these findings.

## 2021-09-11 NOTE — Assessment & Plan Note (Signed)
Followed by rheumatology. Will add cymbalta today to see if we can get some control of her chronic pain.  Consider addition of gabapentin if full dose cymbalta ineffective.

## 2021-09-11 NOTE — Assessment & Plan Note (Addendum)
We will check labs today and make recommendations for changes in plan of care based on results.  No changes to medication at this time. She ahs previously managed with diet. Given her diabetes- we need to consider statin therapy, even at low dose for optimal control.

## 2021-09-11 NOTE — Progress Notes (Signed)
   Covid-19 Vaccination Clinic  Name:  Pam [name]    MRN: [id number] DOB: 07-15-1951  09/11/2021  [name] was observed post Covid-19 immunization for 15 minutes without incident. She was provided with Vaccine Information Sheet and instruction to access the V-Safe system.   [name] was instructed to call 911 with any severe reactions post vaccine: Difficulty breathing  Swelling of face and throat  A fast heartbeat  A bad rash all over body  Dizziness and weakness   Immunizations Administered     Name Date Dose VIS Date Route   Moderna Covid-19 vaccine Bivalent Booster 09/11/2021  1:39 PM 0.5 mL 06/23/2021 Intramuscular   Manufacturer: Moderna   Lot: [id number]   [place]: [id number]

## 2021-09-11 NOTE — Assessment & Plan Note (Signed)
Will obtain labs today for further evaluation. Did review libre view online for average blood glucose readings for the past 30 days. Patient appears to be fairly well controlled with no hypoglycemic events reported and only 1 hyperglycemic event greater than 180 that lasted for less than 15 minutes. She appears to be doing very well on the Ozempic at the current dose of 0.5 mg therefore we will plan to keep her on this at this time. We do feel that the freestyle Elenor Legato is an essential part of her management as it has helped establish dietary factors that influence her glycemic control. We will make changes based on her lab results and follow-up in 3 months.

## 2021-09-11 NOTE — Assessment & Plan Note (Signed)
Unsure if neck pain is coming from her head injury or fibromyalgia. She has ordered a special pillow and would like to see it. She would like to try Acupuncture therapy if the pillow does not help.

## 2021-09-11 NOTE — Assessment & Plan Note (Signed)
Increased fatigued likely related to recent stress and fibromyalgia flair.  Will monitor labs today

## 2021-09-11 NOTE — Assessment & Plan Note (Addendum)
Ongoing and intermittent.  Patient is concerned HA and changes to vision may be related to previous head injury. Unable to view records as this was evaluated in the ED in [place], [place].  Urgent referral placed for neurology to discuss intermittent headaches and vision changes. Optometrist has recommended an MRI on the head. No alarm symptoms present today on exam.  Patient will let us know if she has not heard from Neuro by the end of the week.

## 2021-09-11 NOTE — Assessment & Plan Note (Signed)
Double vision in the setting of astigmatism and previous head injury.  Symptoms are worsening at this point. She has been told by Optometry to have a MRI completed for further evaluation.  She has not had a neurology evaluation in some time- I feel this would be beneficial.  No alarm symptoms present today- EOM intact with no weakness or visual field deficit.  She is not driving at her baseline.  Urgent referral to neuro placed given the recommendations from optometry.

## 2021-09-11 NOTE — Assessment & Plan Note (Signed)
We will review labs today. Has been stable at home per patient. Slight elevation in the office today due to increased anxiety related to recent vision changes. At this time no changes to medication.  Reassurance provided to patient.

## 2021-09-12 LAB — TSH RFX ON ABNORMAL TO FREE T4: TSH: 1.4 u[IU]/mL (ref 0.450–4.500)

## 2021-09-12 LAB — CBC WITH DIFFERENTIAL/PLATELET
Basophils Absolute: 0 10*3/uL (ref 0.0–0.2)
Basos: 1 %
EOS (ABSOLUTE): 0.1 10*3/uL (ref 0.0–0.4)
Eos: 2 %
Hematocrit: 40.7 % (ref 34.0–46.6)
Hemoglobin: 13.3 g/dL (ref 11.1–15.9)
Immature Grans (Abs): 0 10*3/uL (ref 0.0–0.1)
Immature Granulocytes: 0 %
Lymphocytes Absolute: 2.4 10*3/uL (ref 0.7–3.1)
Lymphs: 49 %
MCH: 26.6 pg (ref 26.6–33.0)
MCHC: 32.7 g/dL (ref 31.5–35.7)
MCV: 81 fL (ref 79–97)
Monocytes Absolute: 0.4 10*3/uL (ref 0.1–0.9)
Monocytes: 8 %
Neutrophils Absolute: 1.9 10*3/uL (ref 1.4–7.0)
Neutrophils: 40 %
Platelets: 292 10*3/uL (ref 150–450)
RBC: 5 x10E6/uL (ref 3.77–5.28)
RDW: 14.2 % (ref 11.7–15.4)
WBC: 4.8 10*3/uL (ref 3.4–10.8)

## 2021-09-12 LAB — COMPREHENSIVE METABOLIC PANEL
ALT: 11 IU/L (ref 0–32)
AST: 14 IU/L (ref 0–40)
Albumin/Globulin Ratio: 1.5 (ref 1.2–2.2)
Albumin: 4.3 g/dL (ref 3.7–4.7)
Alkaline Phosphatase: 72 IU/L (ref 44–121)
BUN/Creatinine Ratio: 12 (ref 12–28)
BUN: 8 mg/dL (ref 8–27)
Bilirubin Total: <0.2 mg/dL (ref 0.0–1.2)
CO2: 25 mmol/L (ref 20–29)
Calcium: 9.8 mg/dL (ref 8.7–10.3)
Chloride: 103 mmol/L (ref 96–106)
Creatinine, Ser: 0.67 mg/dL (ref 0.57–1.00)
Globulin, Total: 2.9 g/dL (ref 1.5–4.5)
Glucose: 96 mg/dL (ref 70–99)
Potassium: 4.2 mmol/L (ref 3.5–5.2)
Sodium: 141 mmol/L (ref 134–144)
Total Protein: 7.2 g/dL (ref 6.0–8.5)
eGFR: 93 mL/min/{1.73_m2} (ref >59)

## 2021-09-12 LAB — LIPID PANEL
Chol/HDL Ratio: 3.4 ratio (ref 0.0–4.4)
Cholesterol, Total: 163 mg/dL (ref 100–199)
HDL: 48 mg/dL (ref >39)
LDL Chol Calc (NIH): 94 mg/dL (ref 0–99)
Triglycerides: 119 mg/dL (ref 0–149)
VLDL Cholesterol Cal: 21 mg/dL (ref 5–40)

## 2021-09-12 LAB — VITAMIN B12: Vitamin B-12: 410 pg/mL (ref 232–1245)

## 2021-09-12 LAB — HEMOGLOBIN A1C
Est. average glucose Bld gHb Est-mCnc: 126 mg/dL
Hgb A1c MFr Bld: 6 % — ABNORMAL HIGH (ref 4.8–5.6)

## 2021-09-12 LAB — VITAMIN D 25 HYDROXY (VIT D DEFICIENCY, FRACTURES): Vit D, 25-Hydroxy: 40.2 ng/mL (ref 30.0–100.0)

## 2021-09-13 ENCOUNTER — Encounter: Payer: Self-pay | Admitting: Neurology

## 2021-09-13 ENCOUNTER — Other Ambulatory Visit: Payer: Self-pay

## 2021-09-13 ENCOUNTER — Encounter (INDEPENDENT_AMBULATORY_CARE_PROVIDER_SITE_OTHER): Payer: Self-pay

## 2021-09-13 ENCOUNTER — Ambulatory Visit: Payer: Medicare PPO | Admitting: Neurology

## 2021-09-13 DIAGNOSIS — Z87828 Personal history of other (healed) physical injury and trauma: Secondary | ICD-10-CM | POA: Diagnosis not present

## 2021-09-13 DIAGNOSIS — H532 Diplopia: Secondary | ICD-10-CM | POA: Diagnosis not present

## 2021-09-13 DIAGNOSIS — R519 Headache, unspecified: Secondary | ICD-10-CM | POA: Diagnosis not present

## 2021-09-13 DIAGNOSIS — R42 Dizziness and giddiness: Secondary | ICD-10-CM | POA: Diagnosis not present
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
## 2021-09-13 NOTE — Patient Instructions (Signed)
It was nice to see you again today.   Your neurological exam is benign, which is reassuring.   Your headache description does not sound like a serious type of headache or migraine.  Please continue to follow with your optometrist and continue to have monitoring your double vision and your cataracts.  Please use your cane for safety when you go outside.  Please try to hydrate better with water, 6 to 8 cups of water per day are recommended for the average adult, 8 ounce size each.    Please remember, common headache triggers are: sleep deprivation, dehydration, overheating, stress, hypoglycemia or skipping meals and blood sugar fluctuations, excessive pain medications or excessive alcohol use or caffeine withdrawal. Some people have food triggers such as aged cheese, orange juice or chocolate, especially dark chocolate, or MSG (monosodium glutamate). Try to avoid these headache triggers as much possible. It may be helpful to keep a headache diary to figure out what makes your headaches worse or brings them on and what alleviates them. Some people report headache onset after exercise but studies have shown that regular exercise may actually prevent headaches from coming. If you have exercise-induced headaches, please make sure that you drink plenty of fluid before and after exercising and that you do not over do it and do not overheat.  Your headaches may improve as you are titrating the Cymbalta.  Remember, it takes several weeks for an antidepressant to start taking effect and you are titrating it up.  As discussed, we will go ahead with a brain MRI without contrast.  Since you have an allergy or reaction to MRI contrast, we will do it without contrast.  We will call you with the results and see if we need to arrange for a follow-up in our clinic.

## 2021-09-19 ENCOUNTER — Encounter (INDEPENDENT_AMBULATORY_CARE_PROVIDER_SITE_OTHER): Payer: Self-pay
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
## 2021-09-20 ENCOUNTER — Ambulatory Visit: Payer: Medicare PPO | Admitting: Internal Medicine

## 2021-09-20 ENCOUNTER — Other Ambulatory Visit: Payer: Self-pay

## 2021-09-20 ENCOUNTER — Encounter: Payer: Self-pay | Admitting: Internal Medicine

## 2021-09-20 VITALS — BP 118/64 | HR 83 | Temp 97.6°F | Resp 14 | Ht 60.83 in | Wt 161.0 lb

## 2021-09-20 DIAGNOSIS — J302 Other seasonal allergic rhinitis: Secondary | ICD-10-CM | POA: Diagnosis not present

## 2021-09-20 DIAGNOSIS — J4540 Moderate persistent asthma, uncomplicated: Secondary | ICD-10-CM | POA: Diagnosis not present

## 2021-09-20 DIAGNOSIS — J3089 Other allergic rhinitis: Secondary | ICD-10-CM

## 2021-09-20 MED ORDER — LEVOCETIRIZINE DIHYDROCHLORIDE 5 MG PO TABS
5.0000 mg | ORAL_TABLET | Freq: Every evening | ORAL | 5 refills | Status: DC
Start: 2021-09-20 — End: 2022-03-25

## 2021-09-20 MED ORDER — BUDESONIDE 32 MCG/ACT NA SUSP: 2.0000 | Freq: Every day | NASAL | 5 refills | Status: DC

## 2021-09-20 MED ORDER — BUDESONIDE-FORMOTEROL FUMARATE 160-4.5 MCG/ACT IN AERO: 2.0000 | INHALATION_SPRAY | Freq: Two times a day (BID) | RESPIRATORY_TRACT | 5 refills | Status: DC

## 2021-09-20 NOTE — Patient Instructions (Addendum)
Intermittent Asthma: - your lung testing today looked okay but showed some possible restriction - For Block Therapy: Start Symbicort 160 mcg 2 puffs twice a day with a spacer at the onset of any asthma flares or respiratory illness -; use for at least one week or until symptoms resolve, if needed can use daily - Rinse mouth out after use - Use Albuterol (Proair/Ventolin) 2 puffs every 4-6 hours as needed for chest tightness, wheezing, or coughing - Use Albuterol (Proair/Ventolin) 2 puffs 15 minutes prior to exercise if you have symptoms with activity - Use a spacer with all inhalers - Asthma is not controlled if:  - Symptoms are occurring >2 times a week OR  - >2 times a month nighttime awakenings  - Please call the clinic to schedule a follow up if these symptoms arise   Chronic Rhinitis-seasonal and perennial allergic rhinitis: - previous allergy testing positive to grasses, weeds, trees, dust mites, cat, dog, horse, cockroach. - allergen avoidance as below - consider allergy shots as long term control of your symptoms by teaching your immune system to be more tolerant of your allergy triggers - Continue Nasal Steroid Spray: Rhinocort (budesonide) 1- 2 sprays in each nostril daily (can buy over-the-counter if not covered by insurance)  Best results if used daily. - Continue over the counter antihistamine daily or daily as needed.   -Your options include Zyrtec (Cetirizine) 10mg , Claritin (Loratadine) 10mg , Allegra (Fexofenadine) 180mg , or Xyzal (Levocetirinze) 5mg   Follow-up in 3 months. ---------------------------------------------------------------- Reducing Pollen Exposure  The American Academy of Allergy, Asthma and Immunology suggests the following steps to reduce your exposure to pollen during allergy seasons.    Do not hang sheets or clothing out to dry; pollen may collect on these items. Do not mow lawns or spend time around freshly cut grass; mowing stirs up pollen. Keep  windows closed at night.  Keep car windows closed while driving. Minimize morning activities outdoors, a time when pollen counts are usually at their highest. Stay indoors as much as possible when pollen counts or humidity is high and on windy days when pollen tends to remain in the air longer. Use air conditioning when possible.  Many air conditioners have filters that trap the pollen spores. Use a HEPA room air filter to remove pollen form the indoor air you breathe. DUST MITE AVOIDANCE MEASURES:  There are three main measures that need and can be taken to avoid house dust mites:  Reduce accumulation of dust in general -reduce furniture, clothing, carpeting, books, stuffed animals, especially in bedroom  Separate yourself from the dust -use pillow and mattress encasements (can be found at stores such as [employer] or online) -avoid direct exposure to air condition flow -use a HEPA filter device, especially in the bedroom; you can also use a HEPA filter vacuum cleaner -wipe dust with a moist towel instead of a dry towel or broom when cleaning  Decrease mites and/or their secretions -wash clothing and linen and stuffed animals at highest temperature possible, at least every 2 weeks -stuffed animals can also be placed in a bag and put in a freezer overnight  Despite the above measures, it is impossible to eliminate dust mites or their allergen completely from your home.  With the above measures the burden of mites in your home can be diminished, with the goal of minimizing your allergic symptoms.  Success will be reached only when implementing and using all means together. Control of Dog or Cat Allergen  Avoidance is the best way to manage a dog or cat allergy. If you have a dog or cat and are allergic to dog or cats, consider removing the dog or cat from the home. If you have a dog or cat but don't want to find it a new home, or if your family wants a pet even though someone in  the household is allergic, here are some strategies that may help keep symptoms at bay:  Keep the pet out of your bedroom and restrict it to only a few rooms. Be advised that keeping the dog or cat in only one room will not limit the allergens to that room. Don't pet, hug or kiss the dog or cat; if you do, wash your hands with soap and water. High-efficiency particulate air (HEPA) cleaners run continuously in a bedroom or living room can reduce allergen levels over time. Regular use of a high-efficiency vacuum cleaner or a central vacuum can reduce allergen levels. Giving your dog or cat a bath at least once a week can reduce airborne allergen. Control of Cockroach Allergen  Cockroach allergen has been identified as an important cause of acute attacks of asthma, especially in urban settings.  There are fifty-five species of cockroach that exist in the [place], however only three, the [place], [place] species produce allergen that can affect patients with Asthma.  Allergens can be obtained from fecal particles, egg casings and secretions from cockroaches.    Remove food sources. Reduce access to water. Seal access and entry points. Spray runways with 0.5-1% Diazinon or Chlorpyrifos Blow boric acid power under stoves and refrigerator. Place bait stations (hydramethylnon) at feeding sites. Control of Mold Allergen   Mold and fungi can grow on a variety of surfaces provided certain temperature and moisture conditions exist.  Outdoor molds grow on plants, decaying vegetation and soil.  The major outdoor mold, Alternaria and Cladosporium, are found in very high numbers during hot and dry conditions.  Generally, a late Summer - Fall peak is seen for common outdoor fungal spores.  Rain will temporarily lower outdoor mold spore count, but counts rise rapidly when the rainy period ends.  The most important indoor molds are Aspergillus and Penicillium.  Dark, humid and poorly ventilated  basements are ideal sites for mold growth.  The next most common sites of mold growth are the bathroom and the kitchen.  Outdoor (Seasonal) Mold Control  Use air conditioning and keep windows closed Avoid exposure to decaying vegetation. Avoid leaf raking. Avoid grain handling. Consider wearing a face mask if working in moldy areas.    Indoor (Perennial) Mold Control   Maintain humidity below 50%. Clean washable surfaces with 5% bleach solution. Remove sources e.g. contaminated carpets.

## 2021-10-01 ENCOUNTER — Telehealth (HOSPITAL_BASED_OUTPATIENT_CLINIC_OR_DEPARTMENT_OTHER): Payer: Self-pay

## 2021-10-01 DIAGNOSIS — M797 Fibromyalgia: Secondary | ICD-10-CM

## 2021-10-01 MED ORDER — DULOXETINE HCL 30 MG PO CPEP: 30.0000 mg | ORAL_CAPSULE | Freq: Every day | ORAL | 3 refills | Status: DC

## 2021-10-01 NOTE — Telephone Encounter (Signed)
Patient called in to request Duloxetine prescription be sent in as a tablet not a capsule as she was instructed by Worthy Keeler, DNP to take 1.5 tablets and the prescription is written as a capsule. I will update prescription and send to pharmacy on file as patient has requested

## 2021-10-15 ENCOUNTER — Ambulatory Visit
Admission: RE | Admit: 2021-10-15 | Discharge: 2021-10-15 | Disposition: A | Discharge status: Home / Self Care | Payer: Medicare PPO | Source: Ambulatory Visit | Attending: Neurology | Admitting: Neurology

## 2021-10-15 DIAGNOSIS — Z87828 Personal history of other (healed) physical injury and trauma: Secondary | ICD-10-CM

## 2021-10-15 DIAGNOSIS — R519 Headache, unspecified: Secondary | ICD-10-CM

## 2021-10-15 DIAGNOSIS — H532 Diplopia: Secondary | ICD-10-CM

## 2021-10-15 DIAGNOSIS — R42 Dizziness and giddiness: Secondary | ICD-10-CM | POA: Diagnosis not present

## 2021-10-17 ENCOUNTER — Encounter: Payer: Self-pay | Admitting: Neurology

## 2021-10-17 NOTE — Telephone Encounter (Signed)
[name], MD  10/17/2021 11:04 AM EST Back to Top    Please call patient regarding her brain MRI:  it showed no acute findings, stable as compared to 2019.  At this juncture, she can follow-up with her primary care.

## 2021-10-18 ENCOUNTER — Telehealth: Payer: Self-pay

## 2021-10-18 NOTE — Telephone Encounter (Signed)
Pt returned call. I spoke to her and relayed the message per Dr. Rexene Alberts.  Her brain MRI:  it showed no acute findings, stable as compared to 2019.  At this juncture, she can follow-up with her primary care.  She appreciated results.  Appreciated Dr. Rexene Alberts and our office.

## 2021-10-18 NOTE — Telephone Encounter (Signed)
Pam Age, MD  10/17/2021 11:04 AM EST Back to Top    Please call patient regarding her brain MRI:  it showed no acute findings, stable as compared to 2019.  At this juncture, she can follow-up with her primary care.  Duplicate results were sent to the pt on 10/17/2021. Vm left as well.

## 2021-10-20 DIAGNOSIS — S0502XA Injury of conjunctiva and corneal abrasion without foreign body, left eye, initial encounter: Secondary | ICD-10-CM | POA: Diagnosis not present

## 2021-10-25 DIAGNOSIS — S0502XA Injury of conjunctiva and corneal abrasion without foreign body, left eye, initial encounter: Secondary | ICD-10-CM | POA: Diagnosis not present

## 2021-10-29 DIAGNOSIS — S0502XD Injury of conjunctiva and corneal abrasion without foreign body, left eye, subsequent encounter: Secondary | ICD-10-CM | POA: Diagnosis not present

## 2021-11-09 DIAGNOSIS — H16143 Punctate keratitis, bilateral: Secondary | ICD-10-CM | POA: Diagnosis not present

## 2021-12-05 ENCOUNTER — Encounter (INDEPENDENT_AMBULATORY_CARE_PROVIDER_SITE_OTHER): Payer: Self-pay

## 2021-12-11 ENCOUNTER — Encounter (HOSPITAL_BASED_OUTPATIENT_CLINIC_OR_DEPARTMENT_OTHER): Payer: Self-pay | Admitting: Nurse Practitioner

## 2021-12-11 ENCOUNTER — Ambulatory Visit (HOSPITAL_BASED_OUTPATIENT_CLINIC_OR_DEPARTMENT_OTHER): Payer: Medicare PPO | Admitting: Nurse Practitioner

## 2021-12-11 ENCOUNTER — Other Ambulatory Visit: Payer: Self-pay

## 2021-12-11 VITALS — BP 117/72 | HR 97 | Ht 61.0 in | Wt 152.0 lb

## 2021-12-11 DIAGNOSIS — I10 Essential (primary) hypertension: Secondary | ICD-10-CM

## 2021-12-11 DIAGNOSIS — R519 Headache, unspecified: Secondary | ICD-10-CM

## 2021-12-11 DIAGNOSIS — E118 Type 2 diabetes mellitus with unspecified complications: Secondary | ICD-10-CM

## 2021-12-11 DIAGNOSIS — G8929 Other chronic pain: Secondary | ICD-10-CM | POA: Diagnosis not present

## 2021-12-11 DIAGNOSIS — M797 Fibromyalgia: Secondary | ICD-10-CM

## 2021-12-11 DIAGNOSIS — K580 Irritable bowel syndrome with diarrhea: Secondary | ICD-10-CM

## 2021-12-11 MED ORDER — OZEMPIC (0.25 OR 0.5 MG/DOSE) 2 MG/1.5ML ~~LOC~~ SOPN: PEN_INJECTOR | SUBCUTANEOUS | 3 refills | Status: DC

## 2021-12-11 MED ORDER — GABAPENTIN 100 MG PO CAPS: 100.0000 mg | ORAL_CAPSULE | Freq: Three times a day (TID) | ORAL | 3 refills | Status: DC

## 2021-12-11 NOTE — Assessment & Plan Note (Signed)
IBS symptoms of diarrhea exacerbated with recent restart of magnesium supplementation. She is taking this daily and typically in the morning. Discussed with patient that this medication can cause a side effect of diarrhea and recommend that she alter the dose for every other day dosing and take at bedtime as this can increase fatigue levels as well.  She can taper the medication down until diarrhea symptoms resolved. If symptoms persist or continue we can send to GI for further evaluation if needed.

## 2021-12-11 NOTE — Assessment & Plan Note (Signed)
Blood pressures are well controlled today at 117/72. She is not having any alarm symptoms at this time. Recommend continuation of current medication regimen.  She has done an excellent job with weight loss and glycemic control which is likely contributing to these improved numbers.  We will continue to monitor.  We will plan for labs at follow-up appointment in 3 months or sooner if needed.

## 2021-12-11 NOTE — Assessment & Plan Note (Signed)
Daily chronic headache associated with increased fibromyalgia pain. I do feel that restarting gabapentin daily will help with the symptoms and provide some relief for patient. Will start gabapentin and follow-up with patient in 3 months to see how she is doing.  She is aware that she can contact the office if her symptoms are not relieved with the current medication regimen. No alarm symptoms present today.

## 2021-12-11 NOTE — Assessment & Plan Note (Signed)
Currently on semaglutide 0.5 mg once weekly and using freestyle libre to monitor. Elenor Legato view reviewed today with patient which shows excellent blood sugar control.  Last 14 days show 100% of time in the target range with an average daily glucose of 109.  Her GMI is 5.9% at this time. Discussed this with patient and the significance of these findings indicating excellent blood glucose control. Recommend continuing Ozempic at 0.5 mg. She would like to see if she is eligible to switch to the freestyle libre 3.  She will contact her insurance company and notify us if they will cover this. We will plan for labs at next visit.

## 2021-12-11 NOTE — Assessment & Plan Note (Signed)
Decreased control of fibromyalgia and increased pain associated with weather changes. She did have improved control while taking gabapentin.  We will restart this medication with dosing to allow flexibility based on her pain levels.  Plan to start dosing at 100 to 300 mg up to 3 times a day as needed for pain symptoms.  Discussed with patient that she can alter the doses so that daytime doses are lower and nighttime doses are higher if this provides more efficiency. Recommend continuing with activity levels to help reduce pain. I do feel that she would benefit from acupuncture.  We can send prescription for this today. Recommend follow-up in 3 months or sooner if needed.
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
## 2021-12-11 NOTE — Patient Instructions (Addendum)
Congratulations on your weight loss!!!  I AM SO PROUD OF YOU!!!!!!!!!!!!!!  I have sent in the gabapentin for you . You can take 1-3 capsules up to three times a day as needed for you pain.

## 2021-12-16 ENCOUNTER — Telehealth: Payer: Medicare PPO | Admitting: Family

## 2021-12-16 DIAGNOSIS — M5412 Radiculopathy, cervical region: Secondary | ICD-10-CM | POA: Diagnosis not present

## 2021-12-16 DIAGNOSIS — F419 Anxiety disorder, unspecified: Secondary | ICD-10-CM | POA: Diagnosis not present

## 2021-12-16 NOTE — Progress Notes (Signed)
Virtual Visit Consent   Pam [name], you are scheduled for a virtual visit with a [facility] provider today.     Just as with appointments in the office, your consent must be obtained to participate.  Your consent will be active for this visit and any virtual visit you may have with one of our providers in the next 365 days.     If you have a MyChart account, a copy of this consent can be sent to you electronically.  All virtual visits are billed to your insurance company just like a traditional visit in the office.    As this is a virtual visit, video technology does not allow for your provider to perform a traditional examination.  This may limit your provider's ability to fully assess your condition.  If your provider identifies any concerns that need to be evaluated in person or the need to arrange testing (such as labs, EKG, etc.), we will make arrangements to do so.     Although advances in technology are sophisticated, we cannot ensure that it will always work on either your end or our end.  If the connection with a video visit is poor, the visit may have to be switched to a telephone visit.  With either a video or telephone visit, we are not always able to ensure that we have a secure connection.     I need to obtain your verbal consent now.   Are you willing to proceed with your visit today?    Pam [name] has provided verbal consent on 12/16/2021 for a virtual visit (video or telephone).   Pam Dun, FNP   Date: 12/16/2021 7:38 PM   Virtual Visit via Video Note   I, Pam [name], connected with  Pam [name]  ([id number], 01/11/1951) on 12/16/21 at  7:30 PM EST by a video-enabled telemedicine application and verified that I am speaking with the correct person using two identifiers.  Location: Patient: Virtual Visit Location Patient: Home Provider: Virtual Visit Location Provider: Home Office   I discussed the limitations of evaluation and management by telemedicine and  the availability of in person appointments. The patient expressed understanding and agreed to proceed.    History of Present Illness: Pam [name] is a 71 y.o. who identifies as a female who was assigned female at birth, and is being seen today for headache and pain in left neck that radiates to her left wrist. She reports she has fibromyalgia and has pain similar to this.   She reports the pain has made her anxious and called EMS and had her check her out. Her vitals were stable.   She reports her pain is constant since starting this evening.   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   Uterine leiomyoma 08/10/2018   Sleep disturbance 08/10/2018   Obstructive sleep apnea 08/10/2018   Moderate persistent asthma, uncomplicated 15/40/0867   Mixed urge and stress incontinence 08/10/2018   Hearing loss 08/10/2018   Memory loss 05/29/2018   Vitamin D deficiency 08/22/2013   Pure hypercholesterolemia 08/22/2013   Obesity 08/19/2013   Fibromyalgia    Polymyositis ([place]) 12/20/2010   Postmenopausal atrophic vaginitis 09/25/2010  Controlled diabetes mellitus type 2 with complications ([place]) 71/04/2693   Malignant essential hypertension 09/24/2010   Headache 09/07/2010    Allergies:  Allergies  Allergen Reactions   Amoxicillin Diarrhea, Rash and Other (See Comments)   Multihance [Gadobenate] Nausea And Vomiting    Pt had some nausea and vomiting immediately after contrast administered.   Medications:  Current Outpatient Medications:    albuterol (PROVENTIL) (2.5 MG/3ML) 0.083% nebulizer solution, Take 3 mLs (2.5 mg total) by nebulization 3 (three) times daily as needed for  wheezing or shortness of breath., Disp: 75 mL, Rfl: 5   budesonide (RHINOCORT ALLERGY) 32 MCG/ACT nasal spray, Place 2 sprays into both nostrils daily., Disp: 5 mL, Rfl: 5   budesonide-formoterol (SYMBICORT) 160-4.5 MCG/ACT inhaler, Inhale 2 puffs into the lungs 2 (two) times daily., Disp: 1 each, Rfl: 5   Continuous Blood Gluc Sensor (FREESTYLE LIBRE 2 SENSOR) MISC, Attach one sensor to the arm every 14 days., Disp: 2 each, Rfl: 11   COVID-19 mRNA bivalent vaccine, Moderna, (MODERNA COVID-19 BIVAL BOOSTER) 50 MCG/0.5ML injection, Inject into the muscle., Disp: 0.5 mL, Rfl: 0   DULoxetine (CYMBALTA) 30 MG capsule, Take 1 capsule (30 mg total) by mouth daily., Disp: 30 capsule, Rfl: 3   famotidine (PEPCID) 20 MG tablet, Take 1 tablet (20 mg total) by mouth 2 (two) times daily., Disp: 60 tablet, Rfl: 11   gabapentin (NEURONTIN) 100 MG capsule, Take 1-3 capsules (100-300 mg total) by mouth 3 (three) times daily., Disp: 270 capsule, Rfl: 3   levocetirizine (XYZAL) 5 MG tablet, Take 1 tablet (5 mg total) by mouth every evening., Disp: 30 tablet, Rfl: 5   montelukast (SINGULAIR) 10 MG tablet, TAKE 1 TABLET BY MOUTH EVERYDAY AT BEDTIME, Disp: 30 tablet, Rfl: 3   Multiple Vitamin (MULTIVITAMIN WITH MINERALS) TABS tablet, Take 1 tablet by mouth daily., Disp: , Rfl:    Semaglutide,0.25 or 0.5MG /DOS, (OZEMPIC, 0.25 OR 0.5 MG/DOSE,) 2 MG/1.5ML SOPN, INJECT 0.5 MG UNDER SKIN THE SAME DAY OF EACH WEEK IN ABDOMEN, THIGH, OR UPPER ARM. ROTATE SITE, Disp: 4.5 mL, Rfl: 3   UNABLE TO FIND, Theratears eye drops, Disp: , Rfl:   Current Facility-Administered Medications:    ipratropium-albuterol (DUONEB) 0.5-2.5 (3) MG/3ML nebulizer solution 3 mL, 3 mL, Nebulization, Once, Glendale Chard, MD  Observations/Objective: Patient is well-developed, well-nourished in no acute distress.  Resting comfortably  at home.  Head is normocephalic, atraumatic.  No labored breathing.  Speech is clear and coherent with logical  content.  Patient is alert and oriented at baseline.  Anxious Full ROM of neck   Assessment and Plan: 1. Cervical radiculitis  2. Anxiety  Pt will take Motrin and gabapentin. If pain improves in the next hour she will follow up with her PCP this week or Urgent Care If weakness worsens or vision changes go to ED.   Follow Up Instructions: I discussed the assessment and treatment plan with the patient. The patient was provided an opportunity to ask questions and all were answered. The patient agreed with the plan and demonstrated an understanding of the instructions.  A copy of instructions were sent to the patient via MyChart unless otherwise noted below.     The patient was advised to call back or seek an in-person evaluation if the symptoms worsen or if the condition fails to improve as anticipated.  Time:  I spent 16 minutes with the patient via telehealth technology discussing the above problems/concerns.    Pam Dun, FNP

## 2021-12-20 ENCOUNTER — Ambulatory Visit: Payer: Medicare PPO | Admitting: Physician Assistant
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
## 2021-12-26 ENCOUNTER — Other Ambulatory Visit: Payer: Self-pay

## 2021-12-26 ENCOUNTER — Encounter: Payer: Self-pay | Admitting: Internal Medicine

## 2021-12-26 ENCOUNTER — Ambulatory Visit: Payer: Medicare PPO | Admitting: Internal Medicine

## 2021-12-26 VITALS — BP 110/64 | HR 95 | Temp 96.5°F | Resp 12 | Ht 60.0 in | Wt 164.8 lb

## 2021-12-26 DIAGNOSIS — J4540 Moderate persistent asthma, uncomplicated: Secondary | ICD-10-CM | POA: Diagnosis not present

## 2021-12-26 DIAGNOSIS — J3089 Other allergic rhinitis: Secondary | ICD-10-CM

## 2021-12-26 DIAGNOSIS — J302 Other seasonal allergic rhinitis: Secondary | ICD-10-CM

## 2021-12-26 MED ORDER — BUDESONIDE-FORMOTEROL FUMARATE 160-4.5 MCG/ACT IN AERO: 2.0000 | INHALATION_SPRAY | Freq: Two times a day (BID) | RESPIRATORY_TRACT | 5 refills | Status: DC

## 2021-12-26 NOTE — Patient Instructions (Addendum)
Start all treatments in 2 weeks to avoid seasonal symptoms.  Intermittent Asthma: - For Block Therapy: Start Symbicort 160 mcg 2 puffs twice a day with a spacer at the onset of any asthma flares or respiratory illness -; use for at least one week or until symptoms resolve, if needed can use daily-discussed using daily during Spring - Rinse mouth out after use - Use Albuterol (Proair/Ventolin) 2 puffs every 4-6 hours as needed for chest tightness, wheezing, or coughing - Use Albuterol (Proair/Ventolin) 2 puffs 15 minutes prior to exercise if you have symptoms with activity - Use a spacer with all inhalers - Asthma is not controlled if:  - Symptoms are occurring >2 times a week OR  - >2 times a month nighttime awakenings  - Please call the clinic to schedule a follow up if these symptoms arise   Seasonal and perennial allergic rhinitis: - previous allergy testing positive to grasses, weeds, trees, dust mites, cat, dog, horse, cockroach. - allergen avoidance as below - consider allergy shots as long term control of your symptoms by teaching your immune system to be more tolerant of your allergy triggers - Continue Nasal Steroid Spray: Rhinocort (budesonide) 1- 2 sprays in each nostril daily (can buy over-the-counter if not covered by insurance)  Best results if used daily. - Continue over the counter antihistamine daily or daily as needed.   -Your options include Zyrtec (Cetirizine) 10mg , Claritin (Loratadine) 10mg , Allegra (Fexofenadine) 180mg , or Xyzal (Levocetirinze) 5mg   Follow-up in 4-6 months, sooner if needed. Can discuss need for daily vs intermittent therapy at that visit based on how this Spring goes!   It was a pleasure seeing you again in clinic today!   Sigurd Sos, MD [facility]  ---------------------------------------------------------------- Reducing Pollen Exposure  The American Academy of Allergy, Asthma and Immunology suggests the following steps  to reduce your exposure to pollen during allergy seasons.    Do not hang sheets or clothing out to dry; pollen may collect on these items. Do not mow lawns or spend time around freshly cut grass; mowing stirs up pollen. Keep windows closed at night.  Keep car windows closed while driving. Minimize morning activities outdoors, a time when pollen counts are usually at their highest. Stay indoors as much as possible when pollen counts or humidity is high and on windy days when pollen tends to remain in the air longer. Use air conditioning when possible.  Many air conditioners have filters that trap the pollen spores. Use a HEPA room air filter to remove pollen form the indoor air you breathe.  DUST MITE AVOIDANCE MEASURES:  There are three main measures that need and can be taken to avoid house dust mites:  Reduce accumulation of dust in general -reduce furniture, clothing, carpeting, books, stuffed animals, especially in bedroom  Separate yourself from the dust -use pillow and mattress encasements (can be found at stores such as [employer] or online) -avoid direct exposure to air condition flow -use a HEPA filter device, especially in the bedroom; you can also use a HEPA filter vacuum cleaner -wipe dust with a moist towel instead of a dry towel or broom when cleaning  Decrease mites and/or their secretions -wash clothing and linen and stuffed animals at highest temperature possible, at least every 2 weeks -stuffed animals can also be placed in a bag and put in a freezer overnight  Despite the above measures, it is impossible to eliminate dust mites or their allergen  completely from your home.  With the above measures the burden of mites in your home can be diminished, with the goal of minimizing your allergic symptoms.  Success will be reached only when implementing and using all means together.  Control of Dog or Cat Allergen  Avoidance is the best way to manage a dog or cat  allergy. If you have a dog or cat and are allergic to dog or cats, consider removing the dog or cat from the home. If you have a dog or cat but dont want to find it a new home, or if your family wants a pet even though someone in the household is allergic, here are some strategies that may help keep symptoms at bay:  Keep the pet out of your bedroom and restrict it to only a few rooms. Be advised that keeping the dog or cat in only one room will not limit the allergens to that room. Dont pet, hug or kiss the dog or cat; if you do, wash your hands with soap and water. High-efficiency particulate air (HEPA) cleaners run continuously in a bedroom or living room can reduce allergen levels over time. Regular use of a high-efficiency vacuum cleaner or a central vacuum can reduce allergen levels. Giving your dog or cat a bath at least once a week can reduce airborne allergen. Control of Cockroach Allergen  Cockroach allergen has been identified as an important cause of acute attacks of asthma, especially in urban settings.  There are fifty-five species of cockroach that exist in the [place], however only three, the [place], [place] species produce allergen that can affect patients with Asthma.  Allergens can be obtained from fecal particles, egg casings and secretions from cockroaches.    Remove food sources. Reduce access to water. Seal access and entry points. Spray runways with 0.5-1% Diazinon or Chlorpyrifos Blow boric acid power under stoves and refrigerator. Place bait stations (hydramethylnon) at feeding sites.  Control of Mold Allergen   Mold and fungi can grow on a variety of surfaces provided certain temperature and moisture conditions exist.  Outdoor molds grow on plants, decaying vegetation and soil.  The major outdoor mold, Alternaria and Cladosporium, are found in very high numbers during hot and dry conditions.  Generally, a late Summer - Fall peak is seen for common  outdoor fungal spores.  Rain will temporarily lower outdoor mold spore count, but counts rise rapidly when the rainy period ends.  The most important indoor molds are Aspergillus and Penicillium.  Dark, humid and poorly ventilated basements are ideal sites for mold growth.  The next most common sites of mold growth are the bathroom and the kitchen.  Outdoor (Seasonal) Mold Control  Use air conditioning and keep windows closed Avoid exposure to decaying vegetation. Avoid leaf raking. Avoid grain handling. Consider wearing a face mask if working in moldy areas.    Indoor (Perennial) Mold Control   Maintain humidity below 50%. Clean washable surfaces with 5% bleach solution. Remove sources e.g. contaminated carpets.

## 2022-01-02 ENCOUNTER — Encounter (HOSPITAL_BASED_OUTPATIENT_CLINIC_OR_DEPARTMENT_OTHER): Payer: Self-pay | Admitting: Nurse Practitioner

## 2022-01-02 ENCOUNTER — Telehealth (INDEPENDENT_AMBULATORY_CARE_PROVIDER_SITE_OTHER): Payer: Medicare PPO | Admitting: Nurse Practitioner

## 2022-01-02 ENCOUNTER — Other Ambulatory Visit: Payer: Self-pay

## 2022-01-02 DIAGNOSIS — N3001 Acute cystitis with hematuria: Secondary | ICD-10-CM

## 2022-01-02 MED ORDER — CIPROFLOXACIN HCL 500 MG PO TABS: 500.0000 mg | ORAL_TABLET | Freq: Two times a day (BID) | ORAL | 0 refills | Status: DC

## 2022-01-02 NOTE — Patient Instructions (Signed)
OK to take AZO. Follow directions on the box and be sure to drink plenty of water while taking this.  Your urine will turn a neon orange/yellow color with this medication, this is normal and expected. It can stain clothing and fabrics if it comes in contact.   I have sent in Ciprofloxacin for you to take twice a day for 5 days.  If your symptoms do not improve or any new symptoms develop by Friday morning, please call the office and speak to [place] so we can make sure we take care of this before the weekend.

## 2022-01-02 NOTE — Progress Notes (Signed)
Virtual Visit Encounter telephone visit.   I connected with  Pam [name] on 01/02/22 at 10:50 AM EST by secure audio and/or video enabled telemedicine application. I verified that I am speaking with the correct person using two identifiers.   I introduced myself as a Designer, jewellery with the practice. The limitations of evaluation and management by telemedicine discussed with the patient and the availability of in person appointments. The patient expressed verbal understanding and consent to proceed.  Participating parties in this visit include: Myself and patient  The patient is: Patient Location: Home I am: Provider Location: Office/Clinic Subjective:    CC and HPI: Pam [name] is a 71 y.o. year old female presenting for new evaluation and treatment of UTI. She endorses lower abdominal pressure and dysuria that started last week on Thursday. She took a home test for UTI on Saturday and another on Monday, which showed positive for hematuria and bacteria. She endorses dysuria, frequency, suprapubic pressure, significant fatigue, and low grade fever. She has chronic low back pain that makes it difficult to determine if kidney inclusion is present. She has not taken anything for this medication related but has been drinking increased amounts of cranberry juice. She purchased OTC AZO and would like to know if this is safe for her to take.   Past medical history, Surgical history, Family history not pertinant except as noted below, Social history, Allergies, and medications have been entered into the medical record, reviewed, and corrections made.   Review of Systems:  All review of systems negative except what is listed in the HPI  Objective:    Alert and oriented x 4 Speaking in clear sentences with no shortness of breath. No distress.  Impression and Recommendations:    Problem List Items Addressed This Visit     Acute cystitis with hematuria - Primary    Positive at home UTI  test for hematuria and bacteria with symptoms consistent with acute cystitis.  Given the length of time symptoms have been present and associated symptoms, recommend strong antibiotic regimen to help eradicate infection. Unclear if there is kidney involvement, however, temperature remains low grade which is good.  OK to take AZO for two days. Discussed warnings with medication use.  Increase fluid intake with water or unsweetened cranberry juice as desired. Avoid holding urine and empty bladder frequently to prevent retaining bacteria.  Will send cipro x5 days to pharmacy. Patient instructed to contact the office if new symptoms develop or symptoms do not improve by Friday morning.       Relevant Medications   ciprofloxacin (CIPRO) 500 MG tablet    orders and follow up as documented in EMR I discussed the assessment and treatment plan with the patient. The patient was provided an opportunity to ask questions and all were answered. The patient agreed with the plan and demonstrated an understanding of the instructions.   The patient was advised to call back or seek an in-person evaluation if the symptoms worsen or if the condition fails to improve as anticipated.  Follow-Up: If no improvement in next 2 days  I provided 10 minutes of non-face-to-face interaction with this non face-to-face encounter including intake, same-day documentation, and chart review.   Orma Render, NP , DNP, AGNP-c [facility] [phone] (fax)

## 2022-01-02 NOTE — Assessment & Plan Note (Signed)
Positive at home UTI test for hematuria and bacteria with symptoms consistent with acute cystitis.  Given the length of time symptoms have been present and associated symptoms, recommend strong antibiotic regimen to help eradicate infection. Unclear if there is kidney involvement, however, temperature remains low grade which is good.  OK to take AZO for two days. Discussed warnings with medication use.  Increase fluid intake with water or unsweetened cranberry juice as desired. Avoid holding urine and empty bladder frequently to prevent retaining bacteria.  Will send cipro x5 days to pharmacy. Patient instructed to contact the office if new symptoms develop or symptoms do not improve by Friday morning.

## 2022-02-25 ENCOUNTER — Other Ambulatory Visit (HOSPITAL_BASED_OUTPATIENT_CLINIC_OR_DEPARTMENT_OTHER): Payer: Self-pay

## 2022-02-25 DIAGNOSIS — F419 Anxiety disorder, unspecified: Secondary | ICD-10-CM

## 2022-02-25 MED ORDER — DULOXETINE HCL 30 MG PO CPEP: 30.0000 mg | ORAL_CAPSULE | Freq: Every day | ORAL | 0 refills | Status: DC

## 2022-03-11 ENCOUNTER — Telehealth: Payer: Self-pay | Admitting: Internal Medicine

## 2022-03-11 MED ORDER — ALBUTEROL SULFATE (2.5 MG/3ML) 0.083% IN NEBU: 2.5000 mg | INHALATION_SOLUTION | Freq: Three times a day (TID) | RESPIRATORY_TRACT | 5 refills | Status: DC | PRN

## 2022-03-11 MED ORDER — ALBUTEROL SULFATE HFA 108 (90 BASE) MCG/ACT IN AERS: 2.0000 | INHALATION_SPRAY | Freq: Four times a day (QID) | RESPIRATORY_TRACT | 1 refills | Status: DC | PRN

## 2022-03-11 MED ORDER — BUDESONIDE 0.5 MG/2ML IN SUSP: 0.5000 mg | Freq: Two times a day (BID) | RESPIRATORY_TRACT | 5 refills | Status: DC

## 2022-03-11 NOTE — Telephone Encounter (Signed)
LVM for patient letting her know about the medications being sent in and to call the office to confirm the pharmacy.  ?

## 2022-03-11 NOTE — Telephone Encounter (Signed)
Pam [name] called in and states she is having some wheezing, cough, shortness of breath, headache, and some chest tightness.  Pam [name] is requesting a rescue inhaler and some medication for her nebulizer.  I asked her what kind of medication for her nebulizer and she didn't know and stated whatever we thought she needed.  I noticed she had some prescribed by other doctors back in 2019 and I asked her about those but she didn't seem to remember those medications.  She would like these medications called in to [facility] on [address] in [place].  Please advise.  ?

## 2022-03-12 ENCOUNTER — Ambulatory Visit (HOSPITAL_BASED_OUTPATIENT_CLINIC_OR_DEPARTMENT_OTHER): Payer: Medicare PPO | Admitting: Nurse Practitioner

## 2022-03-16 ENCOUNTER — Other Ambulatory Visit (HOSPITAL_BASED_OUTPATIENT_CLINIC_OR_DEPARTMENT_OTHER): Payer: Self-pay | Admitting: Nurse Practitioner

## 2022-03-16 DIAGNOSIS — E118 Type 2 diabetes mellitus with unspecified complications: Secondary | ICD-10-CM

## 2022-03-23 ENCOUNTER — Other Ambulatory Visit: Payer: Self-pay | Admitting: Internal Medicine

## 2022-03-25 ENCOUNTER — Other Ambulatory Visit: Payer: Self-pay | Admitting: Nurse Practitioner

## 2022-03-25 DIAGNOSIS — Z1231 Encounter for screening mammogram for malignant neoplasm of breast: Secondary | ICD-10-CM

## 2022-03-28 ENCOUNTER — Ambulatory Visit
Admission: RE | Admit: 2022-03-28 | Discharge: 2022-03-28 | Disposition: A | Discharge status: Home / Self Care | Payer: Medicare PPO | Source: Ambulatory Visit

## 2022-03-28 DIAGNOSIS — Z1231 Encounter for screening mammogram for malignant neoplasm of breast: Secondary | ICD-10-CM

## 2022-04-03 ENCOUNTER — Telehealth (HOSPITAL_BASED_OUTPATIENT_CLINIC_OR_DEPARTMENT_OTHER): Payer: Self-pay

## 2022-04-03 ENCOUNTER — Encounter (HOSPITAL_BASED_OUTPATIENT_CLINIC_OR_DEPARTMENT_OTHER): Payer: Self-pay | Admitting: Nurse Practitioner

## 2022-04-03 ENCOUNTER — Ambulatory Visit (HOSPITAL_BASED_OUTPATIENT_CLINIC_OR_DEPARTMENT_OTHER): Payer: Medicare PPO | Admitting: Nurse Practitioner

## 2022-04-03 VITALS — BP 117/72 | HR 87 | Ht 61.0 in | Wt 163.0 lb

## 2022-04-03 DIAGNOSIS — L720 Epidermal cyst: Secondary | ICD-10-CM | POA: Diagnosis not present

## 2022-04-03 DIAGNOSIS — E7800 Pure hypercholesterolemia, unspecified: Secondary | ICD-10-CM

## 2022-04-03 DIAGNOSIS — E1165 Type 2 diabetes mellitus with hyperglycemia: Secondary | ICD-10-CM

## 2022-04-03 DIAGNOSIS — J4541 Moderate persistent asthma with (acute) exacerbation: Secondary | ICD-10-CM

## 2022-04-03 DIAGNOSIS — E118 Type 2 diabetes mellitus with unspecified complications: Secondary | ICD-10-CM | POA: Diagnosis not present

## 2022-04-03 DIAGNOSIS — I10 Essential (primary) hypertension: Secondary | ICD-10-CM | POA: Diagnosis not present

## 2022-04-03 DIAGNOSIS — E559 Vitamin D deficiency, unspecified: Secondary | ICD-10-CM

## 2022-04-03 DIAGNOSIS — F419 Anxiety disorder, unspecified: Secondary | ICD-10-CM

## 2022-04-03 DIAGNOSIS — Z23 Encounter for immunization: Secondary | ICD-10-CM

## 2022-04-03 DIAGNOSIS — L83 Acanthosis nigricans: Secondary | ICD-10-CM

## 2022-04-03 DIAGNOSIS — R5383 Other fatigue: Secondary | ICD-10-CM | POA: Diagnosis not present

## 2022-04-03 DIAGNOSIS — M797 Fibromyalgia: Secondary | ICD-10-CM | POA: Diagnosis not present

## 2022-04-03 MED ORDER — DULOXETINE HCL 30 MG PO CPEP: 30.0000 mg | ORAL_CAPSULE | Freq: Every day | ORAL | 0 refills | Status: DC

## 2022-04-03 MED ORDER — GABAPENTIN 600 MG PO TABS: 600.0000 mg | ORAL_TABLET | Freq: Three times a day (TID) | ORAL | 3 refills | Status: DC

## 2022-04-03 MED ORDER — FREESTYLE LIBRE 2 READER DEVI
0 refills | Status: DC
Start: 2022-04-03 — End: 2022-08-05

## 2022-04-03 MED ORDER — MONTELUKAST SODIUM 10 MG PO TABS: ORAL_TABLET | ORAL | 3 refills | Status: DC

## 2022-04-03 MED ORDER — ZOSTER VAC RECOMB ADJUVANTED 50 MCG/0.5ML IM SUSR
0.5000 mL | Freq: Once | INTRAMUSCULAR | 1 refills | Status: AC
Start: 2022-04-03 — End: 2022-04-03

## 2022-04-03 MED ORDER — TETANUS-DIPHTH-ACELL PERTUSSIS 5-2.5-18.5 LF-MCG/0.5 IM SUSP: 0.5000 mL | Freq: Once | INTRAMUSCULAR | 0 refills | Status: AC

## 2022-04-03 MED ORDER — HYDROGEN PEROXIDE 40 % EX SOLN: CUTANEOUS | 11 refills | Status: DC

## 2022-04-03 MED ORDER — FREESTYLE LIBRE 2 SENSOR MISC: 12 refills | Status: DC

## 2022-04-03 NOTE — Patient Instructions (Addendum)
It was a pleasure seeing you today. I hope your time spent with [place] was pleasant and helpful. Please let us know if there is anything we can do to improve the service you receive.   Today we discussed concerns with:  We will see what the A1c shows and we can see about changing the Ozempic I do want you to get the Shingles vaccine then about a month later you can get the pneumonia vaccine. I also want you to get the tetanus booster shot. You can get these all from the pharmacy.   The following orders have been placed for you today:  No orders of the defined types were placed in this encounter.    Important Office Information Lab Results If labs were ordered, please note that you will see results through [place] as soon as they come available from [place].  It takes up to 5 business days for the results to be routed to me and for me to review them once all of the lab results have come through from [facility]. I will make recommendations based on your results and send these through [place] or someone from the office will call you to discuss. If your labs are abnormal, we may contact you to schedule a visit to discuss the results and make recommendations.  If you have not heard from [place] within 5 business days or you have waited longer than a week and your lab results have not come through on [place], please feel free to call the office or send a message through [place] to follow-up on these labs.   Referrals If referrals were placed today, the office where the referral was sent will contact you either by phone or through [place] to set up scheduling. Please note that it can take up to a week for the referral office to contact you. If you do not hear from them in a week, please contact the referral office directly to inquire about scheduling.   Condition Treated If your condition worsens or you begin to have new symptoms, please schedule a follow-up appointment for further evaluation. If you are not  sure if an appointment is needed, you may call the office to leave a message for the nurse and someone will contact you with recommendations.  If you have an urgent or life threatening emergency, please do not call the office, but seek emergency evaluation by calling 911 or going to the nearest emergency room for evaluation.   MyChart and Phone Calls Please do not use MyChart for urgent messages. It may take up to 3 business days for MyChart messages to be read by staff and if they are unable to handle the request, an additional 3 business days for them to be routed to me and for my response.  Messages sent to the provider through [place] do not come directly to the provider, please allow time for these messages to be routed and for me to respond.  We get a large volume of MyChart messages daily and these are responded to in the order received.   For urgent messages, please call the office at [phone] and speak with the front office staff or leave a message on the line of my assistant for guidance.  We are seeing patients from the hours of 8:00 am through 5:00 pm and calls directly to the nurse may not be answered immediately due to seeing patients, but your call will be returned as soon as possible.  Phone  messages received after  4:00 PM Monday through Thursday may not be returned until the following business day. Phone messages received after 11:00 AM on Friday may not be returned until Monday.   After Hours We share on call hours with providers from other offices. If you have an urgent need after hours that cannot wait until the next business day, please contact the on call provider by calling the office number. A nurse will speak with you and contact the provider if needed for recommendations.  If you have an urgent or life threatening emergency after hours, please do not call the on call provider, but seek emergency evaluation by calling 911 or going to the nearest emergency room for  evaluation.   Paperwork All paperwork requires a minimum of 5 days to complete and return to you or the designated personnel. Please keep this in mind when bringing in forms or sending requests for paperwork completion to the office.

## 2022-04-03 NOTE — Assessment & Plan Note (Signed)
Chronic.  Fairly well-controlled at this time.  No alarm symptoms present.  Continue current treatment.

## 2022-04-03 NOTE — Assessment & Plan Note (Signed)
Chronic.  Blood pressure well controlled today.  No alarm symptoms present at this time.  Recommend continuation of current medication regimen.  We will obtain labs today for evaluation.  Refills provided today.  Follow-up in 6 months or sooner if needed.

## 2022-04-03 NOTE — Assessment & Plan Note (Signed)
Chronic.  Will obtain labs today for evaluation.  No change in therapy at this time.

## 2022-04-03 NOTE — Assessment & Plan Note (Signed)
Chronic.  Etiology unknown.  Will obtain labs today for evaluation.  Suspect this could be related to her worsening blood glucose levels however we will monitor closely.  No alarm symptoms at this time.

## 2022-04-03 NOTE — Progress Notes (Signed)
Pam Keeler, DNP, AGNP-c Pam [name] Pam [name], [place] [address] [phone] Office [phone] Fax  ESTABLISHED PATIENT- Chronic Health and/or Follow-Up Visit  Blood pressure 117/72, pulse 87, height '5\' 1"'$  (1.549 m), weight 163 lb (73.9 kg), SpO2 97 %.  Follow-up (Discuss diabetes it is running higher ( 200) after meal /Pt stated she has no energy/Got a new puppy Pam [name]) he is biting her on her ankles)   HPI  Pam [name]  is a 71 y.o. year old female presenting today for evaluation and management of the following: Diabetes Blood sugars running high  200 after meals Energy levels are down Feels like her A1c is not going to be as good as it has been Would like to know if we should increase Ozempic back to higher dose Has been dizzy at times Boil At gluteal cleft Gets one about 1-2 times a year Drains a little No odor, burning, itching Vision Weird sensation in the right eye Peripheral vision occasionally has sensation that she is "looking under water" No central vision changes Chronic floaters Contacted eye doctor and was told this was not concerning No pain, erythema, edema, drainage Rash Dark spots on back, scattered, non painful Her father had these  Has a new puppy   ROS All ROS negative with exception of what is listed in HPI  PHYSICAL EXAM Physical Exam Vitals and nursing note reviewed.  Constitutional:      Appearance: Normal appearance. She is obese.  HENT:     Head: Normocephalic.  Eyes:     Extraocular Movements: Extraocular movements intact.     Conjunctiva/sclera: Conjunctivae normal.     Pupils: Pupils are equal, round, and reactive to light.  Neck:     Vascular: No carotid bruit.  Cardiovascular:     Rate and Rhythm: Normal rate and regular rhythm.     Pulses: Normal pulses.     Heart sounds: Normal heart sounds.  Pulmonary:     Effort: Pulmonary effort is normal.      Breath sounds: Normal breath sounds.  Abdominal:     General: Bowel sounds are normal.     Palpations: Abdomen is soft.  Musculoskeletal:     Cervical back: Normal range of motion.     Right lower leg: No edema.     Left lower leg: No edema.  Skin:    General: Skin is warm and dry.     Capillary Refill: Capillary refill takes less than 2 seconds.     Findings: Abscess and lesion present.          Comments: Scattered dark brown lesions noted to the posterior trunk. Surface non scaling.  No erythema, edema, excoriation present.  Neurological:     General: No focal deficit present.     Mental Status: She is alert and oriented to person, place, and time.  Psychiatric:        Mood and Affect: Mood normal.        Behavior: Behavior normal.        Thought Content: Thought content normal.        Judgment: Judgment normal.    ASSESSMENT & PLAN Problem List Items Addressed This Visit     Fibromyalgia    Chronic.  Fairly well-controlled at this time.  No alarm symptoms present.  Continue current treatment.       Relevant Medications   gabapentin (NEURONTIN) 600 MG tablet   DULoxetine (CYMBALTA) 30 MG  capsule   Vitamin D deficiency    Chronic.  Repeat labs today for evaluation.       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  Blood sugars have been elevated recently following reduction in Ozempic dosage recently.  Discussed with patient the option to increase medication at this time or wait for A1c results.  Joint decision made to wait for A1c results to return before increasing the dosage.  Encourage patient to closely monitor diet and continue with low carbohydrate low saturated fat diet and weekly exercise of at least 150 minutes.  Refills provided on medication today.       Relevant Medications   gabapentin (NEURONTIN) 600 MG tablet   Other Relevant Orders    Ambulatory referral to Podiatry   Comprehensive metabolic panel   CBC with Differential/Platelet   Hemoglobin A1c   TSH   T4   B12 and Folate Panel   Iron, TIBC and Ferritin Panel   VITAMIN D 25 Hydroxy (Vit-D Deficiency, Fractures)   Urine Microalbumin w/creat. ratio   Pure hypercholesterolemia    Chronic.  Will obtain labs today for evaluation.  No change in therapy at this time.       Malignant essential hypertension    Chronic.  Blood pressure well controlled today.  No alarm symptoms present at this time.  Recommend continuation of current medication regimen.  We will obtain labs today for evaluation.  Refills provided today.  Follow-up in 6 months or sooner if needed.       Relevant Medications   gabapentin (NEURONTIN) 600 MG tablet   Other Relevant Orders   Comprehensive metabolic panel   CBC with Differential/Platelet   Hemoglobin A1c   TSH   T4   B12 and Folate Panel   Iron, TIBC and Ferritin Panel   VITAMIN D 25 Hydroxy (Vit-D Deficiency, Fractures)   Urine Microalbumin w/creat. ratio   Fatigue    Chronic.  Etiology unknown.  Will obtain labs today for evaluation.  Suspect this could be related to her worsening blood glucose levels however we will monitor closely.  No alarm symptoms at this time.       Relevant Medications   gabapentin (NEURONTIN) 600 MG tablet   Other Relevant Orders   Comprehensive metabolic panel   CBC with Differential/Platelet   Hemoglobin A1c   TSH   T4   B12 and Folate Panel   Iron, TIBC and Ferritin Panel   VITAMIN D 25 Hydroxy (Vit-D Deficiency, Fractures)   Urine Microalbumin w/creat. ratio   Keratosis nigricans    Symptoms and presentation consistent with keratosis nigra cans.  We will send in treatment with hydrogen peroxide 40% solution for application with cotton-tipped applicator.  Apply to area once.  If after 3 weeks lesion is still present may reapply again.  Patient may bring medication into the office and we will apply  for her if she would like.  She will follow-up if there are any concerning symptoms.  Monitor closely for signs of infection.       Relevant Medications   Hydrogen Peroxide 40 % SOLN   Inclusion cyst    Symptoms and presentation consistent with inclusion cyst of the gluteal cleft.  Verbal consent obtained.  Area cleansed with chlorhexidine swab.  Using sterile technique 1% lidocaine with epinephrine buffered with sodium bicarb solution in 3:1 ratio infiltrated into base  of lesion.  #11 blade utilized to make approximate 2 mm incision into lesion.  Purulent and serosanguineous fluid drained.  Patient tolerated procedure well.  Topical antibiotic ointment placed and gauze applied and taped in place.  Patient instructed to keep gauze pad over area until drainage stops.  No signs of systemic infection present at this time.  Patient instructed to follow-up if symptoms worsen or fail to improve.       Other Visit Diagnoses     Uncontrolled type 2 diabetes mellitus with hyperglycemia (HCC)       Anxiety       Relevant Medications   DULoxetine (CYMBALTA) 30 MG capsule   Moderate persistent asthma with acute exacerbation       Relevant Medications   montelukast (SINGULAIR) 10 MG tablet   Need for shingles vaccine       Relevant Medications   Zoster Vaccine Adjuvanted [facility]) injection   Need for tetanus booster            FOLLOW-UP Return in about 6 months (around 10/04/2022) for 40 min- follow-up.   Pam Keeler, DNP, AGNP-c 04/03/2022 10:08 AM

## 2022-04-03 NOTE — Assessment & Plan Note (Signed)
Symptoms and presentation consistent with inclusion cyst of the gluteal cleft.  Verbal consent obtained.  Area cleansed with chlorhexidine swab.  Using sterile technique 1% lidocaine with epinephrine buffered with sodium bicarb solution in 3:1 ratio infiltrated into base of lesion.  #11 blade utilized to make approximate 2 mm incision into lesion.  Purulent and serosanguineous fluid drained.  Patient tolerated procedure well.  Topical antibiotic ointment placed and gauze applied and taped in place.  Patient instructed to keep gauze pad over area until drainage stops.  No signs of systemic infection present at this time.  Patient instructed to follow-up if symptoms worsen or fail to improve.

## 2022-04-03 NOTE — Assessment & Plan Note (Signed)
Symptoms and presentation consistent with keratosis nigra cans.  We will send in treatment with hydrogen peroxide 40% solution for application with cotton-tipped applicator.  Apply to area once.  If after 3 weeks lesion is still present may reapply again.  Patient may bring medication into the office and we will apply for her if she would like.  She will follow-up if there are any concerning symptoms.  Monitor closely for signs of infection.

## 2022-04-03 NOTE — Assessment & Plan Note (Signed)
Chronic.  Repeat labs today for evaluation.

## 2022-04-03 NOTE — Assessment & Plan Note (Signed)
Chronic.  Blood sugars have been elevated recently following reduction in Ozempic dosage recently.  Discussed with patient the option to increase medication at this time or wait for A1c results.  Joint decision made to wait for A1c results to return before increasing the dosage.  Encourage patient to closely monitor diet and continue with low carbohydrate low saturated fat diet and weekly exercise of at least 150 minutes.  Refills provided on medication today.

## 2022-04-04 LAB — COMPREHENSIVE METABOLIC PANEL
ALT: 12 IU/L (ref 0–32)
AST: 15 IU/L (ref 0–40)
Albumin/Globulin Ratio: 1.4 (ref 1.2–2.2)
Albumin: 4.4 g/dL (ref 3.7–4.7)
Alkaline Phosphatase: 75 IU/L (ref 44–121)
BUN/Creatinine Ratio: 15 (ref 12–28)
BUN: 10 mg/dL (ref 8–27)
Bilirubin Total: 0.2 mg/dL (ref 0.0–1.2)
CO2: 25 mmol/L (ref 20–29)
Calcium: 9.9 mg/dL (ref 8.7–10.3)
Chloride: 103 mmol/L (ref 96–106)
Creatinine, Ser: 0.66 mg/dL (ref 0.57–1.00)
Globulin, Total: 3.1 g/dL (ref 1.5–4.5)
Glucose: 94 mg/dL (ref 70–99)
Potassium: 4 mmol/L (ref 3.5–5.2)
Sodium: 140 mmol/L (ref 134–144)
Total Protein: 7.5 g/dL (ref 6.0–8.5)
eGFR: 94 mL/min/{1.73_m2} (ref >59)

## 2022-04-04 LAB — B12 AND FOLATE PANEL
Folate: 18.1 ng/mL (ref >3.0)
Vitamin B-12: 408 pg/mL (ref 232–1245)

## 2022-04-04 LAB — CBC WITH DIFFERENTIAL/PLATELET
Basophils Absolute: 0 10*3/uL (ref 0.0–0.2)
Basos: 1 %
EOS (ABSOLUTE): 0.2 10*3/uL (ref 0.0–0.4)
Eos: 4 %
Hematocrit: 43.3 % (ref 34.0–46.6)
Hemoglobin: 14.2 g/dL (ref 11.1–15.9)
Immature Grans (Abs): 0 10*3/uL (ref 0.0–0.1)
Immature Granulocytes: 0 %
Lymphocytes Absolute: 2 10*3/uL (ref 0.7–3.1)
Lymphs: 45 %
MCH: 26.6 pg (ref 26.6–33.0)
MCHC: 32.8 g/dL (ref 31.5–35.7)
MCV: 81 fL (ref 79–97)
Monocytes Absolute: 0.4 10*3/uL (ref 0.1–0.9)
Monocytes: 9 %
Neutrophils Absolute: 1.8 10*3/uL (ref 1.4–7.0)
Neutrophils: 41 %
Platelets: 288 10*3/uL (ref 150–450)
RBC: 5.33 x10E6/uL — ABNORMAL HIGH (ref 3.77–5.28)
RDW: 13.7 % (ref 11.7–15.4)
WBC: 4.3 10*3/uL (ref 3.4–10.8)

## 2022-04-04 LAB — IRON,TIBC AND FERRITIN PANEL
Ferritin: 115 ng/mL (ref 15–150)
Iron Saturation: 30 % (ref 15–55)
Iron: 93 ug/dL (ref 27–139)
Total Iron Binding Capacity: 306 ug/dL (ref 250–450)
UIBC: 213 ug/dL (ref 118–369)

## 2022-04-04 LAB — HEMOGLOBIN A1C
Est. average glucose Bld gHb Est-mCnc: 134 mg/dL
Hgb A1c MFr Bld: 6.3 % — ABNORMAL HIGH (ref 4.8–5.6)

## 2022-04-04 LAB — TSH: TSH: 1.53 u[IU]/mL (ref 0.450–4.500)

## 2022-04-04 LAB — VITAMIN D 25 HYDROXY (VIT D DEFICIENCY, FRACTURES): Vit D, 25-Hydroxy: 43.6 ng/mL (ref 30.0–100.0)

## 2022-04-04 LAB — T4: T4, Total: 7.9 ug/dL (ref 4.5–12.0)

## 2022-04-10 DIAGNOSIS — M797 Fibromyalgia: Secondary | ICD-10-CM | POA: Diagnosis not present

## 2022-04-10 DIAGNOSIS — M5451 Vertebrogenic low back pain: Secondary | ICD-10-CM | POA: Diagnosis not present

## 2022-04-10 DIAGNOSIS — M542 Cervicalgia: Secondary | ICD-10-CM | POA: Diagnosis not present

## 2022-04-15 ENCOUNTER — Ambulatory Visit: Payer: Medicare PPO | Admitting: Podiatry

## 2022-04-15 ENCOUNTER — Encounter: Payer: Self-pay | Admitting: Podiatry

## 2022-04-15 DIAGNOSIS — L600 Ingrowing nail: Secondary | ICD-10-CM | POA: Diagnosis not present

## 2022-04-15 NOTE — Patient Instructions (Signed)

## 2022-04-15 NOTE — Progress Notes (Signed)
Subjective:   Patient ID: Pam [name], female   DOB: 71 y.o.   MRN: [id number]   HPI Patient presents stating she developed a chronic ingrown toenail of her left big toe and its been sore and hard for her to wear shoe gear and has been present for several months.  She has not noticed any drainage or redness but is just tender and she may have had an ingrown toenail fixed around 20 years ago.  Patient does not smoke likes to be active sugar under excellent control with A1c around six-point   Review of Systems  All other systems reviewed and are negative.      Objective:  Physical Exam Vitals and nursing note reviewed.  Constitutional:      Appearance: She is well-developed.  Pulmonary:     Effort: Pulmonary effort is normal.  Musculoskeletal:        General: Normal range of motion.  Skin:    General: Skin is warm.  Neurological:     Mental Status: She is alert.    Neurovascular status found to be intact muscle strength found to be adequate range of motion within normal limits with incurvated lateral border left hallux noted tender when pressed with pain.  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  She wants the procedure understands risk signs consent form infiltrated the left hallux 60 mg like Marcaine mixture sterile prep done using sterile instrumentation remove the lateral border exposed matrix applied phenol 3 applications 30 seconds followed by alcohol lavage sterile dressing and instructed on continuing this pattern leaving dressing on 24 hours but taken off earlier if any throbbing were to  occur and encouraged to call questions concerns

## 2022-04-16 ENCOUNTER — Telehealth: Payer: Self-pay | Admitting: *Deleted

## 2022-04-16 NOTE — Telephone Encounter (Signed)
Patient is calling because she is scheduled for PT this week and just had an ingrown procedure, can she continue with the therapy but wanted the doctor to know that she is  having a great deal of pain with procedural toe,some better this morning. Please advise.

## 2022-04-17 NOTE — Telephone Encounter (Signed)
Patient has been notified of recommendations thru voice message.

## 2022-04-17 NOTE — Telephone Encounter (Signed)
Should be ok. Normal to have pain for a few days

## 2022-04-30 DIAGNOSIS — S161XXD Strain of muscle, fascia and tendon at neck level, subsequent encounter: Secondary | ICD-10-CM | POA: Diagnosis not present

## 2022-04-30 DIAGNOSIS — S39012D Strain of muscle, fascia and tendon of lower back, subsequent encounter: Secondary | ICD-10-CM | POA: Diagnosis not present

## 2022-04-30 DIAGNOSIS — M6281 Muscle weakness (generalized): Secondary | ICD-10-CM | POA: Diagnosis not present
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
## 2022-05-01 ENCOUNTER — Ambulatory Visit: Payer: Medicare PPO | Admitting: Internal Medicine

## 2022-05-01 ENCOUNTER — Encounter: Payer: Self-pay | Admitting: Internal Medicine

## 2022-05-01 VITALS — BP 130/62 | HR 80 | Temp 97.6°F | Resp 18 | Ht 61.0 in | Wt 160.4 lb

## 2022-05-01 DIAGNOSIS — J3089 Other allergic rhinitis: Secondary | ICD-10-CM | POA: Diagnosis not present

## 2022-05-01 DIAGNOSIS — J4540 Moderate persistent asthma, uncomplicated: Secondary | ICD-10-CM | POA: Diagnosis not present

## 2022-05-01 DIAGNOSIS — J302 Other seasonal allergic rhinitis: Secondary | ICD-10-CM

## 2022-05-01 MED ORDER — LEVOCETIRIZINE DIHYDROCHLORIDE 5 MG PO TABS: ORAL_TABLET | ORAL | 1 refills | Status: DC

## 2022-05-01 MED ORDER — BUDESONIDE 32 MCG/ACT NA SUSP: 2.0000 | Freq: Every day | NASAL | 5 refills | Status: DC

## 2022-05-01 MED ORDER — BUDESONIDE-FORMOTEROL FUMARATE 80-4.5 MCG/ACT IN AERO: 2.0000 | INHALATION_SPRAY | Freq: Two times a day (BID) | RESPIRATORY_TRACT | 5 refills | Status: DC

## 2022-05-01 NOTE — Patient Instructions (Addendum)
Mild Persistent Asthma: Your breathing test today showed moderate obstruction, it did not get better with albuterol - let's work on deep breathing-imaging trying to fill up a jug of water----breath all the way down until your rib cage expands, then breath out and imaging pushing all of the water/air out - Can also try box breathing-4 seconds deep inhale, hold for 4 seconds, then 4 seconds deep exhale, hold for 4 seconds, then repeat  - look up yoga breathing exercises for other ideas - lets start Symbicort 80, 2 puffs twice daily for the next 3 months and see how you are feeling at follow-up  - For Spring: Start Symbicort 160 mcg 2 puffs twice a day with a spacer at the onset of any asthma flares or respiratory illness -; use for at least one week or until symptoms resolve, if needed can use daily-discussed using daily during Spring - Rinse mouth out after use - Use Albuterol (Proair/Ventolin) 2 puffs every 4-6 hours as needed for chest tightness, wheezing, or coughing - Use Albuterol (Proair/Ventolin) 2 puffs 15 minutes prior to exercise if you have symptoms with activity - Use a spacer with all inhalers - Asthma is not controlled if:  - Symptoms are occurring >2 times a week OR  - >2 times a month nighttime awakenings  - Please call the clinic to schedule a follow up if these symptoms arise  Seasonal and perennial allergic rhinitis: - previous allergy testing positive to grasses, weeds, trees, dust mites, cat, dog, horse, cockroach. - allergen avoidance as below - consider allergy shots as long term control of your symptoms by teaching your immune system to be more tolerant of your allergy triggers - Continue Nasal Steroid Spray: Rhinocort (budesonide) 1- 2 sprays in each nostril daily (can buy over-the-counter if not covered by insurance)  Best results if used daily. - Continue over the counter antihistamine daily or daily as needed.   -Your options include Zyrtec (Cetirizine) '10mg'$ ,  Claritin (Loratadine) '10mg'$ , Allegra (Fexofenadine) '180mg'$ , or Xyzal (Levocetirinze) '5mg'$   Follow-up in 3 months, sooner if needed.   It was a pleasure seeing you again in clinic today!   Pam Sos, MD [facility]   ---------------------------------------------------------------- Reducing Pollen Exposure  The American Academy of Allergy, Asthma and Immunology suggests the following steps to reduce your exposure to pollen during allergy seasons.    Do not hang sheets or clothing out to dry; pollen may collect on these items. Do not mow lawns or spend time around freshly cut grass; mowing stirs up pollen. Keep windows closed at night.  Keep car windows closed while driving. Minimize morning activities outdoors, a time when pollen counts are usually at their highest. Stay indoors as much as possible when pollen counts or humidity is high and on windy days when pollen tends to remain in the air longer. Use air conditioning when possible.  Many air conditioners have filters that trap the pollen spores. Use a HEPA room air filter to remove pollen form the indoor air you breathe.  DUST MITE AVOIDANCE MEASURES:  There are three main measures that need and can be taken to avoid house dust mites:  Reduce accumulation of dust in general -reduce furniture, clothing, carpeting, books, stuffed animals, especially in bedroom  Separate yourself from the dust -use pillow and mattress encasements (can be found at stores such as [employer] or online) -avoid direct exposure to air condition flow -use a HEPA filter device, especially in the bedroom;  you can also use a HEPA filter vacuum cleaner -wipe dust with a moist towel instead of a dry towel or broom when cleaning  Decrease mites and/or their secretions -wash clothing and linen and stuffed animals at highest temperature possible, at least every 2 weeks -stuffed animals can also be placed in a bag and put in a  freezer overnight  Despite the above measures, it is impossible to eliminate dust mites or their allergen completely from your home.  With the above measures the burden of mites in your home can be diminished, with the goal of minimizing your allergic symptoms.  Success will be reached only when implementing and using all means together.  Control of Dog or Cat Allergen  Avoidance is the best way to manage a dog or cat allergy. If you have a dog or cat and are allergic to dog or cats, consider removing the dog or cat from the home. If you have a dog or cat but don't want to find it a new home, or if your family wants a pet even though someone in the household is allergic, here are some strategies that may help keep symptoms at bay:  Keep the pet out of your bedroom and restrict it to only a few rooms. Be advised that keeping the dog or cat in only one room will not limit the allergens to that room. Don't pet, hug or kiss the dog or cat; if you do, wash your hands with soap and water. High-efficiency particulate air (HEPA) cleaners run continuously in a bedroom or living room can reduce allergen levels over time. Regular use of a high-efficiency vacuum cleaner or a central vacuum can reduce allergen levels. Giving your dog or cat a bath at least once a week can reduce airborne allergen. Control of Cockroach Allergen  Cockroach allergen has been identified as an important cause of acute attacks of asthma, especially in urban settings.  There are fifty-five species of cockroach that exist in the [place], however only three, the [place], [place] species produce allergen that can affect patients with Asthma.  Allergens can be obtained from fecal particles, egg casings and secretions from cockroaches.    Remove food sources. Reduce access to water. Seal access and entry points. Spray runways with 0.5-1% Diazinon or Chlorpyrifos Blow boric acid power under stoves and  refrigerator. Place bait stations (hydramethylnon) at feeding sites.  Control of Mold Allergen   Mold and fungi can grow on a variety of surfaces provided certain temperature and moisture conditions exist.  Outdoor molds grow on plants, decaying vegetation and soil.  The major outdoor mold, Alternaria and Cladosporium, are found in very high numbers during hot and dry conditions.  Generally, a late Summer - Fall peak is seen for common outdoor fungal spores.  Rain will temporarily lower outdoor mold spore count, but counts rise rapidly when the rainy period ends.  The most important indoor molds are Aspergillus and Penicillium.  Dark, humid and poorly ventilated basements are ideal sites for mold growth.  The next most common sites of mold growth are the bathroom and the kitchen.  Outdoor (Seasonal) Mold Control  Use air conditioning and keep windows closed Avoid exposure to decaying vegetation. Avoid leaf raking. Avoid grain handling. Consider wearing a face mask if working in moldy areas.    Indoor (Perennial) Mold Control   Maintain humidity below 50%. Clean washable surfaces with 5% bleach solution. Remove sources e.g. contaminated carpets.

## 2022-05-02 ENCOUNTER — Other Ambulatory Visit: Payer: Self-pay

## 2022-05-03 ENCOUNTER — Encounter (HOSPITAL_BASED_OUTPATIENT_CLINIC_OR_DEPARTMENT_OTHER): Payer: Self-pay | Admitting: Nurse Practitioner

## 2022-05-03 ENCOUNTER — Other Ambulatory Visit (HOSPITAL_BASED_OUTPATIENT_CLINIC_OR_DEPARTMENT_OTHER): Payer: Self-pay | Admitting: Nurse Practitioner

## 2022-05-03 DIAGNOSIS — B372 Candidiasis of skin and nail: Secondary | ICD-10-CM

## 2022-05-03 DIAGNOSIS — F419 Anxiety disorder, unspecified: Secondary | ICD-10-CM

## 2022-05-03 MED ORDER — NYSTATIN 100000 UNIT/GM EX POWD: 1.0000 | Freq: Three times a day (TID) | CUTANEOUS | 5 refills | Status: DC

## 2022-05-03 MED ORDER — MOMETASONE FUROATE 50 MCG/ACT NA SUSP
2.0000 | Freq: Every day | NASAL | 5 refills | Status: DC
Start: 2022-05-03 — End: 2022-07-30

## 2022-05-05 MED ORDER — DULOXETINE HCL 30 MG PO CPEP: 30.0000 mg | ORAL_CAPSULE | Freq: Every day | ORAL | 0 refills | Status: DC

## 2022-05-09 DIAGNOSIS — M6281 Muscle weakness (generalized): Secondary | ICD-10-CM | POA: Diagnosis not present

## 2022-05-09 DIAGNOSIS — S161XXD Strain of muscle, fascia and tendon at neck level, subsequent encounter: Secondary | ICD-10-CM | POA: Diagnosis not present

## 2022-05-09 DIAGNOSIS — S39012D Strain of muscle, fascia and tendon of lower back, subsequent encounter: Secondary | ICD-10-CM | POA: Diagnosis not present

## 2022-05-16 DIAGNOSIS — M6281 Muscle weakness (generalized): Secondary | ICD-10-CM | POA: Diagnosis not present

## 2022-05-16 DIAGNOSIS — S161XXD Strain of muscle, fascia and tendon at neck level, subsequent encounter: Secondary | ICD-10-CM | POA: Diagnosis not present

## 2022-05-16 DIAGNOSIS — S39012D Strain of muscle, fascia and tendon of lower back, subsequent encounter: Secondary | ICD-10-CM | POA: Diagnosis not present

## 2022-05-21 DIAGNOSIS — S39012D Strain of muscle, fascia and tendon of lower back, subsequent encounter: Secondary | ICD-10-CM | POA: Diagnosis not present

## 2022-05-21 DIAGNOSIS — M6281 Muscle weakness (generalized): Secondary | ICD-10-CM | POA: Diagnosis not present

## 2022-05-21 DIAGNOSIS — S161XXD Strain of muscle, fascia and tendon at neck level, subsequent encounter: Secondary | ICD-10-CM | POA: Diagnosis not present

## 2022-05-23 ENCOUNTER — Ambulatory Visit: Payer: Medicare PPO | Admitting: Podiatry

## 2022-05-23 DIAGNOSIS — L03032 Cellulitis of left toe: Secondary | ICD-10-CM | POA: Diagnosis not present

## 2022-05-23 MED ORDER — DOXYCYCLINE HYCLATE 100 MG PO TABS: 100.0000 mg | ORAL_TABLET | Freq: Two times a day (BID) | ORAL | 1 refills | Status: DC

## 2022-05-24 NOTE — Progress Notes (Signed)
Subjective:   Patient ID: Pam [name], female   DOB: 71 y.o.   MRN: [id number]   HPI Patient complains of some redness and discomfort of the left hallux lateral side where the nailbed was removed.  There is some discomfort there is no drainage or bleeding noted currently and she is just concerned about infection neuro   ROS      Objective:  Physical Exam  Vascular status was found to be intact with localized paronychia infection of the left hallux lateral border with some redness noted in the proximal nail fold no other proximal edema erythema or drainage noted.     Assessment:  Possibility for low-grade paronychia infection of the left hallux after having had nail procedure done over 1 month ago where she may have gotten some bacteria in the last week     Plan:  Reviewed condition and signs to watch out for and is precautionary measure I did place her on doxycycline today and explained to her its usage for approximate 1 week.  Strict instructions of increased erythema edema or drainage were to occur to reappoint immediately

## 2022-05-28 DIAGNOSIS — S161XXD Strain of muscle, fascia and tendon at neck level, subsequent encounter: Secondary | ICD-10-CM | POA: Diagnosis not present

## 2022-05-28 DIAGNOSIS — M6281 Muscle weakness (generalized): Secondary | ICD-10-CM | POA: Diagnosis not present

## 2022-05-28 DIAGNOSIS — S39012D Strain of muscle, fascia and tendon of lower back, subsequent encounter: Secondary | ICD-10-CM | POA: Diagnosis not present

## 2022-05-31 DIAGNOSIS — S39012D Strain of muscle, fascia and tendon of lower back, subsequent encounter: Secondary | ICD-10-CM | POA: Diagnosis not present

## 2022-05-31 DIAGNOSIS — S161XXD Strain of muscle, fascia and tendon at neck level, subsequent encounter: Secondary | ICD-10-CM | POA: Diagnosis not present

## 2022-05-31 DIAGNOSIS — M6281 Muscle weakness (generalized): Secondary | ICD-10-CM | POA: Diagnosis not present

## 2022-06-03 DIAGNOSIS — M25552 Pain in left hip: Secondary | ICD-10-CM | POA: Diagnosis not present

## 2022-06-03 DIAGNOSIS — M25551 Pain in right hip: Secondary | ICD-10-CM | POA: Diagnosis not present

## 2022-06-05 ENCOUNTER — Ambulatory Visit: Payer: Medicare PPO | Admitting: Podiatry

## 2022-06-05 DIAGNOSIS — S161XXD Strain of muscle, fascia and tendon at neck level, subsequent encounter: Secondary | ICD-10-CM | POA: Diagnosis not present

## 2022-06-05 DIAGNOSIS — S39012D Strain of muscle, fascia and tendon of lower back, subsequent encounter: Secondary | ICD-10-CM | POA: Diagnosis not present

## 2022-06-05 DIAGNOSIS — M6281 Muscle weakness (generalized): Secondary | ICD-10-CM | POA: Diagnosis not present

## 2022-06-12 DIAGNOSIS — M6281 Muscle weakness (generalized): Secondary | ICD-10-CM | POA: Diagnosis not present

## 2022-06-12 DIAGNOSIS — S39012D Strain of muscle, fascia and tendon of lower back, subsequent encounter: Secondary | ICD-10-CM | POA: Diagnosis not present

## 2022-06-12 DIAGNOSIS — S161XXD Strain of muscle, fascia and tendon at neck level, subsequent encounter: Secondary | ICD-10-CM | POA: Diagnosis not present

## 2022-06-25 DIAGNOSIS — S39012D Strain of muscle, fascia and tendon of lower back, subsequent encounter: Secondary | ICD-10-CM | POA: Diagnosis not present

## 2022-06-25 DIAGNOSIS — M6281 Muscle weakness (generalized): Secondary | ICD-10-CM | POA: Diagnosis not present

## 2022-06-25 DIAGNOSIS — S161XXD Strain of muscle, fascia and tendon at neck level, subsequent encounter: Secondary | ICD-10-CM | POA: Diagnosis not present

## 2022-06-27 DIAGNOSIS — S161XXD Strain of muscle, fascia and tendon at neck level, subsequent encounter: Secondary | ICD-10-CM | POA: Diagnosis not present

## 2022-06-27 DIAGNOSIS — M6281 Muscle weakness (generalized): Secondary | ICD-10-CM | POA: Diagnosis not present

## 2022-06-27 DIAGNOSIS — S39012D Strain of muscle, fascia and tendon of lower back, subsequent encounter: Secondary | ICD-10-CM | POA: Diagnosis not present

## 2022-07-01 DIAGNOSIS — M719 Bursopathy, unspecified: Secondary | ICD-10-CM | POA: Insufficient documentation

## 2022-07-02 ENCOUNTER — Other Ambulatory Visit (HOSPITAL_BASED_OUTPATIENT_CLINIC_OR_DEPARTMENT_OTHER): Payer: Self-pay | Admitting: Nurse Practitioner

## 2022-07-02 DIAGNOSIS — S39012D Strain of muscle, fascia and tendon of lower back, subsequent encounter: Secondary | ICD-10-CM | POA: Diagnosis not present

## 2022-07-02 DIAGNOSIS — E11649 Type 2 diabetes mellitus with hypoglycemia without coma: Secondary | ICD-10-CM

## 2022-07-02 DIAGNOSIS — E669 Obesity, unspecified: Secondary | ICD-10-CM

## 2022-07-02 DIAGNOSIS — M6281 Muscle weakness (generalized): Secondary | ICD-10-CM | POA: Diagnosis not present

## 2022-07-02 DIAGNOSIS — M25551 Pain in right hip: Secondary | ICD-10-CM | POA: Diagnosis not present

## 2022-07-02 DIAGNOSIS — M25552 Pain in left hip: Secondary | ICD-10-CM | POA: Diagnosis not present

## 2022-07-02 DIAGNOSIS — S336XXD Sprain of sacroiliac joint, subsequent encounter: Secondary | ICD-10-CM | POA: Diagnosis not present

## 2022-07-04 ENCOUNTER — Other Ambulatory Visit: Payer: Self-pay | Admitting: Internal Medicine

## 2022-07-10 DIAGNOSIS — M6281 Muscle weakness (generalized): Secondary | ICD-10-CM | POA: Diagnosis not present

## 2022-07-10 DIAGNOSIS — S336XXD Sprain of sacroiliac joint, subsequent encounter: Secondary | ICD-10-CM | POA: Diagnosis not present

## 2022-07-10 DIAGNOSIS — S39012D Strain of muscle, fascia and tendon of lower back, subsequent encounter: Secondary | ICD-10-CM | POA: Diagnosis not present

## 2022-07-16 DIAGNOSIS — M6281 Muscle weakness (generalized): Secondary | ICD-10-CM | POA: Diagnosis not present

## 2022-07-16 DIAGNOSIS — S336XXD Sprain of sacroiliac joint, subsequent encounter: Secondary | ICD-10-CM | POA: Diagnosis not present

## 2022-07-16 DIAGNOSIS — S39012D Strain of muscle, fascia and tendon of lower back, subsequent encounter: Secondary | ICD-10-CM | POA: Diagnosis not present

## 2022-07-17 ENCOUNTER — Other Ambulatory Visit (HOSPITAL_BASED_OUTPATIENT_CLINIC_OR_DEPARTMENT_OTHER): Payer: Self-pay | Admitting: Nurse Practitioner

## 2022-07-17 DIAGNOSIS — B372 Candidiasis of skin and nail: Secondary | ICD-10-CM

## 2022-07-17 DIAGNOSIS — M25552 Pain in left hip: Secondary | ICD-10-CM | POA: Diagnosis not present

## 2022-07-17 DIAGNOSIS — M25551 Pain in right hip: Secondary | ICD-10-CM | POA: Diagnosis not present

## 2022-07-18 DIAGNOSIS — S39012D Strain of muscle, fascia and tendon of lower back, subsequent encounter: Secondary | ICD-10-CM | POA: Diagnosis not present

## 2022-07-18 DIAGNOSIS — S336XXD Sprain of sacroiliac joint, subsequent encounter: Secondary | ICD-10-CM | POA: Diagnosis not present

## 2022-07-18 DIAGNOSIS — M6281 Muscle weakness (generalized): Secondary | ICD-10-CM | POA: Diagnosis not present
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
## 2022-07-23 DIAGNOSIS — S336XXD Sprain of sacroiliac joint, subsequent encounter: Secondary | ICD-10-CM | POA: Diagnosis not present

## 2022-07-23 DIAGNOSIS — S39012D Strain of muscle, fascia and tendon of lower back, subsequent encounter: Secondary | ICD-10-CM | POA: Diagnosis not present

## 2022-07-23 DIAGNOSIS — M6281 Muscle weakness (generalized): Secondary | ICD-10-CM | POA: Diagnosis not present

## 2022-07-24 ENCOUNTER — Encounter: Payer: Self-pay | Admitting: Internal Medicine

## 2022-07-24 ENCOUNTER — Ambulatory Visit: Payer: Medicare PPO | Admitting: Internal Medicine

## 2022-07-24 VITALS — BP 110/52 | HR 59 | Temp 97.5°F | Resp 18

## 2022-07-24 DIAGNOSIS — J4541 Moderate persistent asthma with (acute) exacerbation: Secondary | ICD-10-CM

## 2022-07-24 DIAGNOSIS — J3089 Other allergic rhinitis: Secondary | ICD-10-CM

## 2022-07-24 DIAGNOSIS — J302 Other seasonal allergic rhinitis: Secondary | ICD-10-CM

## 2022-07-24 MED ORDER — SYMBICORT 80-4.5 MCG/ACT IN AERO: 2.0000 | INHALATION_SPRAY | Freq: Two times a day (BID) | RESPIRATORY_TRACT | 4 refills | Status: DC

## 2022-07-24 NOTE — Patient Instructions (Addendum)
Mild Persistent Asthma: Your breathing test today showed mild obstruction, but was difficult to interpret due to technique Start Symbicort 80 mcg 2 puffs twice a day, with a spacer (new spacer provided today)  - For flare/respiratory illness: add pulmicort (budesonide) 1 vial twice a day for 2 weeks or until your symptoms improve - Rinse mouth out after use - Use Albuterol (Proair/Ventolin) 2 puffs every 4-6 hours as needed for chest tightness, wheezing, or coughing - Use Albuterol (Proair/Ventolin) 2 puffs 15 minutes prior to exercise/walking stairs/running after puppy if you have symptoms with activity - Use a spacer with all inhalers - Asthma is not controlled if:  - Symptoms are occurring >2 times a week OR  - >2 times a month nighttime awakenings  - Please call the clinic to schedule a follow up if these symptoms arise  Seasonal and perennial allergic rhinitis: - previous allergy testing positive to grasses, weeds, trees, dust mites, cat, dog, horse, cockroach. - allergen avoidance as below - consider allergy shots as long term control of your symptoms by teaching your immune system to be more tolerant of your allergy triggers - Continue Nasal Steroid Spray: Rhinocort (budesonide) 1- 2 sprays in each nostril daily (can buy over-the-counter if not covered by insurance)  Best results if used daily. - Continue over the counter antihistamine daily or daily as needed.   -Your options include Zyrtec (Cetirizine) '10mg'$ , Claritin (Loratadine) '10mg'$ , Allegra (Fexofenadine) '180mg'$ , or Xyzal (Levocetirinze) '5mg'$   Follow-up in 4 months, sooner if needed.   It was a pleasure seeing you again in clinic today!   Sigurd Sos, MD [facility]  ---------------------------------------------------------------- Reducing Pollen Exposure  The American Academy of Allergy, Asthma and Immunology suggests the following steps to reduce your exposure to pollen during allergy seasons.    Do  not hang sheets or clothing out to dry; pollen may collect on these items. Do not mow lawns or spend time around freshly cut grass; mowing stirs up pollen. Keep windows closed at night.  Keep car windows closed while driving. Minimize morning activities outdoors, a time when pollen counts are usually at their highest. Stay indoors as much as possible when pollen counts or humidity is high and on windy days when pollen tends to remain in the air longer. Use air conditioning when possible.  Many air conditioners have filters that trap the pollen spores. Use a HEPA room air filter to remove pollen form the indoor air you breathe.  DUST MITE AVOIDANCE MEASURES:  There are three main measures that need and can be taken to avoid house dust mites:  Reduce accumulation of dust in general -reduce furniture, clothing, carpeting, books, stuffed animals, especially in bedroom  Separate yourself from the dust -use pillow and mattress encasements (can be found at stores such as [employer] or online) -avoid direct exposure to air condition flow -use a HEPA filter device, especially in the bedroom; you can also use a HEPA filter vacuum cleaner -wipe dust with a moist towel instead of a dry towel or broom when cleaning  Decrease mites and/or their secretions -wash clothing and linen and stuffed animals at highest temperature possible, at least every 2 weeks -stuffed animals can also be placed in a bag and put in a freezer overnight  Despite the above measures, it is impossible to eliminate dust mites or their allergen completely from your home.  With the above measures the burden of mites in your home can be diminished,  with the goal of minimizing your allergic symptoms.  Success will be reached only when implementing and using all means together.  Control of Dog or Cat Allergen  Avoidance is the best way to manage a dog or cat allergy. If you have a dog or cat and are allergic to dog or cats,  consider removing the dog or cat from the home. If you have a dog or cat but don't want to find it a new home, or if your family wants a pet even though someone in the household is allergic, here are some strategies that may help keep symptoms at bay:  Keep the pet out of your bedroom and restrict it to only a few rooms. Be advised that keeping the dog or cat in only one room will not limit the allergens to that room. Don't pet, hug or kiss the dog or cat; if you do, wash your hands with soap and water. High-efficiency particulate air (HEPA) cleaners run continuously in a bedroom or living room can reduce allergen levels over time. Regular use of a high-efficiency vacuum cleaner or a central vacuum can reduce allergen levels. Giving your dog or cat a bath at least once a week can reduce airborne allergen. Control of Cockroach Allergen  Cockroach allergen has been identified as an important cause of acute attacks of asthma, especially in urban settings.  There are fifty-five species of cockroach that exist in the [place], however only three, the [place], [place] species produce allergen that can affect patients with Asthma.  Allergens can be obtained from fecal particles, egg casings and secretions from cockroaches.    Remove food sources. Reduce access to water. Seal access and entry points. Spray runways with 0.5-1% Diazinon or Chlorpyrifos Blow boric acid power under stoves and refrigerator. Place bait stations (hydramethylnon) at feeding sites.  Control of Mold Allergen   Mold and fungi can grow on a variety of surfaces provided certain temperature and moisture conditions exist.  Outdoor molds grow on plants, decaying vegetation and soil.  The major outdoor mold, Alternaria and Cladosporium, are found in very high numbers during hot and dry conditions.  Generally, a late Summer - Fall peak is seen for common outdoor fungal spores.  Rain will temporarily lower outdoor mold  spore count, but counts rise rapidly when the rainy period ends.  The most important indoor molds are Aspergillus and Penicillium.  Dark, humid and poorly ventilated basements are ideal sites for mold growth.  The next most common sites of mold growth are the bathroom and the kitchen.  Outdoor (Seasonal) Mold Control  Use air conditioning and keep windows closed Avoid exposure to decaying vegetation. Avoid leaf raking. Avoid grain handling. Consider wearing a face mask if working in moldy areas.    Indoor (Perennial) Mold Control   Maintain humidity below 50%. Clean washable surfaces with 5% bleach solution. Remove sources e.g. contaminated carpets.

## 2022-07-25 ENCOUNTER — Ambulatory Visit (INDEPENDENT_AMBULATORY_CARE_PROVIDER_SITE_OTHER): Payer: Medicare PPO | Admitting: Nurse Practitioner

## 2022-07-25 ENCOUNTER — Encounter (HOSPITAL_BASED_OUTPATIENT_CLINIC_OR_DEPARTMENT_OTHER): Payer: Self-pay | Admitting: Nurse Practitioner

## 2022-07-25 ENCOUNTER — Other Ambulatory Visit: Payer: Self-pay | Admitting: Internal Medicine

## 2022-07-25 VITALS — BP 131/111 | HR 72 | Ht 61.0 in | Wt 154.0 lb

## 2022-07-25 DIAGNOSIS — K625 Hemorrhage of anus and rectum: Secondary | ICD-10-CM

## 2022-07-25 DIAGNOSIS — R52 Pain, unspecified: Secondary | ICD-10-CM

## 2022-07-25 DIAGNOSIS — B351 Tinea unguium: Secondary | ICD-10-CM | POA: Diagnosis not present

## 2022-07-25 DIAGNOSIS — E1149 Type 2 diabetes mellitus with other diabetic neurological complication: Secondary | ICD-10-CM | POA: Diagnosis not present

## 2022-07-25 DIAGNOSIS — M6281 Muscle weakness (generalized): Secondary | ICD-10-CM | POA: Diagnosis not present

## 2022-07-25 DIAGNOSIS — S336XXD Sprain of sacroiliac joint, subsequent encounter: Secondary | ICD-10-CM | POA: Diagnosis not present

## 2022-07-25 DIAGNOSIS — R55 Syncope and collapse: Secondary | ICD-10-CM

## 2022-07-25 DIAGNOSIS — S39012D Strain of muscle, fascia and tendon of lower back, subsequent encounter: Secondary | ICD-10-CM | POA: Diagnosis not present

## 2022-07-25 NOTE — Patient Instructions (Addendum)
It was a pleasure seeing you today. I hope your time spent with [place] was pleasant and helpful. Please let us know if there is anything we can do to improve the service you receive.   I have sent a referral to a new podiatrist I want you to let me know immediately if you pass out again.    Important Office Information Lab Results If labs were ordered, please note that you will see results through [place] as soon as they come available from [facility].  It takes up to 5 business days for the results to be routed to me and for me to review them once all of the lab results have come through from [facility]. I will make recommendations based on your results and send these through [place] or someone from the office will call you to discuss. If your labs are abnormal, we may contact you to schedule a visit to discuss the results and make recommendations.  If you have not heard from [place] within 5 business days or you have waited longer than a week and your lab results have not come through on [place], please feel free to call the office or send a message through [place] to follow-up on these labs.   Referrals If referrals were placed today, the office where the referral was sent will contact you either by phone or through [place] to set up scheduling. Please note that it can take up to a week for the referral office to contact you. If you do not hear from them in a week, please contact the referral office directly to inquire about scheduling.   Condition Treated If your condition worsens or you begin to have new symptoms, please schedule a follow-up appointment for further evaluation. If you are not sure if an appointment is needed, you may call the office to leave a message for the nurse and someone will contact you with recommendations.  If you have an urgent or life threatening emergency, please do not call the office, but seek emergency evaluation by calling 911 or going to the nearest emergency room for  evaluation.   MyChart and Phone Calls Please do not use MyChart for urgent messages. It may take up to 3 business days for MyChart messages to be read by staff and if they are unable to handle the request, an additional 3 business days for them to be routed to me and for my response.  Messages sent to the provider through [place] do not come directly to the provider, please allow time for these messages to be routed and for me to respond.  We get a large volume of MyChart messages daily and these are responded to in the order received.   For urgent messages, please call the office at [phone] and speak with the front office staff or leave a message on the line of my assistant for guidance.  We are seeing patients from the hours of 8:00 am through 5:00 pm and calls directly to the nurse may not be answered immediately due to seeing patients, but your call will be returned as soon as possible.  Phone  messages received after 4:00 PM Monday through Thursday may not be returned until the following business day. Phone messages received after 11:00 AM on Friday may not be returned until Monday.   After Hours We share on call hours with providers from other offices. If you have an urgent need after hours that cannot wait until the next business day, please contact  the on call provider by calling the office number. A nurse will speak with you and contact the provider if needed for recommendations.  If you have an urgent or life threatening emergency after hours, please do not call the on call provider, but seek emergency evaluation by calling 911 or going to the nearest emergency room for evaluation.   Paperwork All paperwork requires a minimum of 5 days to complete and return to you or the designated personnel. Please keep this in mind when bringing in forms or sending requests for paperwork completion to the office.

## 2022-07-25 NOTE — Progress Notes (Signed)
Orma Render, DNP, AGNP-c [facility] [address]  [place], [place] [address] [phone]  Subjective:   Pam [name] is a 71 y.o. female presents to day for evaluation of: Acute Visit (Patient is having dizzy x 1 month, Patient stated she passed out this morning )  Syncope and collapse Felt dizzy this morning and thought that she needed to sit down, she tells me the next thing she knew she was laying out on the ground.  She tells me she awoke to the dog licking hr face She tells me that she took her temperature, blood pressure, blood sugar, and EKG with her portable EKG machine and all of the results were normal.  She has never had any syncope or lapsed time prior to this event She has had some mild dizziness over the last month, but no falls or LOC until today No CP, palpitations, shob, vision changes, weakness, confusion, difficulty speaking or swallowing She has had some blood on her toilet paper when wiping, but feels this is related to fissure or hemorrhoid. No other bleeding.  Weight She tells me she has been staying active with PT at [facility]) . She has lost a considerable amount of weight and went down to 151lbs  Pain She has sharp radiating pain from top of the legs up into the abdomen. She tells me this happens about once a day. She looked it up and says that it is a sign of muscular dystrophy. The symptoms last for just a second or so then stopped.   Feet She endorses difficulty trimming her toenails and a change in the appearance and growth of toenails.   PMH, Medications, and Allergies reviewed and updated in chart as appropriate.   ROS negative except for what is listed in HPI. Objective:  BP (!) 131/111   Pulse 72   Ht '5\' 1"'$  (1.549 m)   Wt 154 lb (69.9 kg)   SpO2 99%   BMI 29.10 kg/m  Physical Exam Vitals and nursing note reviewed.  Constitutional:      General:  She is not in acute distress.    Appearance: Normal appearance. She is not ill-appearing or diaphoretic.  HENT:     Head: Normocephalic.     Mouth/Throat:     Mouth: Mucous membranes are moist.  Eyes:     Extraocular Movements: Extraocular movements intact.     Pupils: Pupils are equal, round, and reactive to light.  Neck:     Vascular: No carotid bruit.  Cardiovascular:     Rate and Rhythm: Normal rate and regular rhythm.     Pulses: Normal pulses.     Heart sounds: Normal heart sounds.  Pulmonary:     Effort: Pulmonary effort is normal.     Breath sounds: Normal breath sounds.  Abdominal:     General: Bowel sounds are normal. There is no distension.     Palpations: Abdomen is soft.     Tenderness: There is no abdominal tenderness. There is no guarding.  Musculoskeletal:     Cervical back: Normal range of motion.     Right lower leg: No edema.     Left lower leg: No edema.  Lymphadenopathy:     Cervical: No cervical adenopathy.  Skin:    General: Skin is warm and dry.     Capillary Refill: Capillary refill takes less than 2 seconds.  Neurological:     General:  No focal deficit present.     Mental Status: She is alert and oriented to person, place, and time.     Cranial Nerves: No cranial nerve deficit.     Sensory: No sensory deficit.     Motor: No weakness.     Coordination: Coordination normal.     Gait: Gait normal.     Deep Tendon Reflexes: Reflexes normal.  Psychiatric:        Mood and Affect: Mood normal.        Behavior: Behavior normal.        Thought Content: Thought content normal.        Judgment: Judgment normal.           Assessment & Plan:   Problem List Items Addressed This Visit     Type 2 diabetes mellitus with neurological complications (HCC)    Chronic. Will check BG and A1c today with recent syncopal episode. She has been exercising more, therefore it is possible she is having hypoglycemic episodes. Consider CGM for monitoring at least  short term.       Relevant Orders   Ambulatory referral to Podiatry   CBC with Differential/Platelet (Completed)   Comprehensive metabolic panel (Completed)   Iron, TIBC and Ferritin Panel (Completed)   VITAMIN D 25 Hydroxy (Vit-D Deficiency, Fractures) (Completed)   B12 and Folate Panel (Completed)   Hemoglobin A1c (Completed)   POCT UA - Microalbumin   Syncope and collapse - Primary    Etiology unknown. At home EKG was normal. HRRR today WNL. Her BP is considerably elevated, however, she is quite anxious. No neurological deficits noted today. No alarm sx present at this time. I do feel that cardiology referral is our next best step for evaluation. Will obtain labs today and monitor closely. If this occurs again, she has been instructed to go to the ED      Relevant Orders   Ambulatory referral to Podiatry   CBC with Differential/Platelet (Completed)   Comprehensive metabolic panel (Completed)   Iron, TIBC and Ferritin Panel (Completed)   VITAMIN D 25 Hydroxy (Vit-D Deficiency, Fractures) (Completed)   B12 and Folate Panel (Completed)   Hemoglobin A1c (Completed)   POCT UA - Microalbumin   Ambulatory referral to Cardiology   Nail fungus    Referral to podiatry      Rectal bleeding    Hx of hemorrhoids with recent BRB on toilet paper with BM. She did have an episode of syncope and collapse today, which is concerning for possible blood loss. She declines referral to GI at this time. We will obtain labs to ensure hgb is stable. Encouraged her to let me know if this worsens or persists.       Relevant Orders   Ambulatory referral to Podiatry   CBC with Differential/Platelet (Completed)   Comprehensive metabolic panel (Completed)   Iron, TIBC and Ferritin Panel (Completed)   VITAMIN D 25 Hydroxy (Vit-D Deficiency, Fractures) (Completed)   B12 and Folate Panel (Completed)   Hemoglobin A1c (Completed)   POCT UA - Microalbumin   Pain    Brief mention of pain from thighs up into  the abdomen bilaterally about once a day lasting only seconds. At this time the etiology is unclear. Does not appear to be related to her current chief concern. We will monitor some labs today and plan to circle back around to this once we have a clear understanding of her syncope/dizziness. She is aware if symptoms worsen or new  symptoms develop she is to follow-up.          Orma Render, DNP, AGNP-c 08/04/2022  11:18 PM    Time: 50 minutes, >50% spent counseling, care coordination, chart review, and documentation.    History, Medications, Surgery, SDOH, and Family History reviewed and updated as appropriate.

## 2022-07-26 ENCOUNTER — Encounter (HOSPITAL_BASED_OUTPATIENT_CLINIC_OR_DEPARTMENT_OTHER): Payer: Self-pay | Admitting: Nurse Practitioner

## 2022-07-26 LAB — CBC WITH DIFFERENTIAL/PLATELET
Basophils Absolute: 0 10*3/uL (ref 0.0–0.2)
Basos: 0 %
EOS (ABSOLUTE): 0.1 10*3/uL (ref 0.0–0.4)
Eos: 1 %
Hematocrit: 41.4 % (ref 34.0–46.6)
Hemoglobin: 13.3 g/dL (ref 11.1–15.9)
Immature Grans (Abs): 0 10*3/uL (ref 0.0–0.1)
Immature Granulocytes: 0 %
Lymphocytes Absolute: 1.4 10*3/uL (ref 0.7–3.1)
Lymphs: 21 %
MCH: 25.9 pg — ABNORMAL LOW (ref 26.6–33.0)
MCHC: 32.1 g/dL (ref 31.5–35.7)
MCV: 81 fL (ref 79–97)
Monocytes Absolute: 0.4 10*3/uL (ref 0.1–0.9)
Monocytes: 5 %
Neutrophils Absolute: 4.9 10*3/uL (ref 1.4–7.0)
Neutrophils: 73 %
Platelets: 286 10*3/uL (ref 150–450)
RBC: 5.14 x10E6/uL (ref 3.77–5.28)
RDW: 13.9 % (ref 11.7–15.4)
WBC: 6.7 10*3/uL (ref 3.4–10.8)

## 2022-07-26 LAB — COMPREHENSIVE METABOLIC PANEL
ALT: 12 IU/L (ref 0–32)
AST: 17 IU/L (ref 0–40)
Albumin/Globulin Ratio: 1.4 (ref 1.2–2.2)
Albumin: 4.3 g/dL (ref 3.8–4.8)
Alkaline Phosphatase: 71 IU/L (ref 44–121)
BUN/Creatinine Ratio: 24 (ref 12–28)
BUN: 14 mg/dL (ref 8–27)
Bilirubin Total: 0.2 mg/dL (ref 0.0–1.2)
CO2: 25 mmol/L (ref 20–29)
Calcium: 10.1 mg/dL (ref 8.7–10.3)
Chloride: 102 mmol/L (ref 96–106)
Creatinine, Ser: 0.59 mg/dL (ref 0.57–1.00)
Globulin, Total: 3.1 g/dL (ref 1.5–4.5)
Glucose: 157 mg/dL — ABNORMAL HIGH (ref 70–99)
Potassium: 3.9 mmol/L (ref 3.5–5.2)
Sodium: 141 mmol/L (ref 134–144)
Total Protein: 7.4 g/dL (ref 6.0–8.5)
eGFR: 96 mL/min/{1.73_m2} (ref >59)

## 2022-07-26 LAB — B12 AND FOLATE PANEL
Folate: 16.7 ng/mL (ref >3.0)
Vitamin B-12: 593 pg/mL (ref 232–1245)

## 2022-07-26 LAB — IRON,TIBC AND FERRITIN PANEL
Ferritin: 185 ng/mL — ABNORMAL HIGH (ref 15–150)
Iron Saturation: 26 % (ref 15–55)
Iron: 75 ug/dL (ref 27–139)
Total Iron Binding Capacity: 284 ug/dL (ref 250–450)
UIBC: 209 ug/dL (ref 118–369)

## 2022-07-26 LAB — HEMOGLOBIN A1C
Est. average glucose Bld gHb Est-mCnc: 134 mg/dL
Hgb A1c MFr Bld: 6.3 % — ABNORMAL HIGH (ref 4.8–5.6)

## 2022-07-26 LAB — VITAMIN D 25 HYDROXY (VIT D DEFICIENCY, FRACTURES): Vit D, 25-Hydroxy: 32.9 ng/mL (ref 30.0–100.0)

## 2022-07-28 ENCOUNTER — Other Ambulatory Visit: Payer: Self-pay | Admitting: Internal Medicine

## 2022-07-29 ENCOUNTER — Encounter (HOSPITAL_BASED_OUTPATIENT_CLINIC_OR_DEPARTMENT_OTHER): Payer: Self-pay | Admitting: Nurse Practitioner

## 2022-07-29 DIAGNOSIS — S336XXD Sprain of sacroiliac joint, subsequent encounter: Secondary | ICD-10-CM | POA: Diagnosis not present

## 2022-07-29 DIAGNOSIS — S39012D Strain of muscle, fascia and tendon of lower back, subsequent encounter: Secondary | ICD-10-CM | POA: Diagnosis not present

## 2022-07-29 DIAGNOSIS — M6281 Muscle weakness (generalized): Secondary | ICD-10-CM | POA: Diagnosis not present

## 2022-07-30 ENCOUNTER — Other Ambulatory Visit: Payer: Self-pay | Admitting: Internal Medicine

## 2022-07-31 DIAGNOSIS — M6281 Muscle weakness (generalized): Secondary | ICD-10-CM | POA: Diagnosis not present

## 2022-07-31 DIAGNOSIS — S336XXD Sprain of sacroiliac joint, subsequent encounter: Secondary | ICD-10-CM | POA: Diagnosis not present

## 2022-07-31 DIAGNOSIS — S39012D Strain of muscle, fascia and tendon of lower back, subsequent encounter: Secondary | ICD-10-CM | POA: Diagnosis not present

## 2022-08-01 ENCOUNTER — Other Ambulatory Visit (HOSPITAL_BASED_OUTPATIENT_CLINIC_OR_DEPARTMENT_OTHER): Payer: Self-pay | Admitting: Nurse Practitioner

## 2022-08-01 DIAGNOSIS — L608 Other nail disorders: Secondary | ICD-10-CM

## 2022-08-01 DIAGNOSIS — E118 Type 2 diabetes mellitus with unspecified complications: Secondary | ICD-10-CM

## 2022-08-04 DIAGNOSIS — B351 Tinea unguium: Secondary | ICD-10-CM | POA: Insufficient documentation

## 2022-08-04 DIAGNOSIS — R55 Syncope and collapse: Secondary | ICD-10-CM | POA: Insufficient documentation

## 2022-08-04 DIAGNOSIS — R52 Pain, unspecified: Secondary | ICD-10-CM | POA: Insufficient documentation

## 2022-08-04 DIAGNOSIS — K625 Hemorrhage of anus and rectum: Secondary | ICD-10-CM | POA: Insufficient documentation

## 2022-08-04 NOTE — Assessment & Plan Note (Signed)
Chronic. Will check BG and A1c today with recent syncopal episode. She has been exercising more, therefore it is possible she is having hypoglycemic episodes. Consider CGM for monitoring at least short term.

## 2022-08-04 NOTE — Assessment & Plan Note (Signed)
Etiology unknown. At home EKG was normal. HRRR today WNL. Her BP is considerably elevated, however, she is quite anxious. No neurological deficits noted today. No alarm sx present at this time. I do feel that cardiology referral is our next best step for evaluation. Will obtain labs today and monitor closely. If this occurs again, she has been instructed to go to the ED

## 2022-08-04 NOTE — Assessment & Plan Note (Signed)
Brief mention of pain from thighs up into the abdomen bilaterally about once a day lasting only seconds. At this time the etiology is unclear. Does not appear to be related to her current chief concern. We will monitor some labs today and plan to circle back around to this once we have a clear understanding of her syncope/dizziness. She is aware if symptoms worsen or new symptoms develop she is to follow-up.

## 2022-08-04 NOTE — Assessment & Plan Note (Signed)
Hx of hemorrhoids with recent BRB on toilet paper with BM. She did have an episode of syncope and collapse today, which is concerning for possible blood loss. She declines referral to GI at this time. We will obtain labs to ensure hgb is stable. Encouraged her to let me know if this worsens or persists.

## 2022-08-04 NOTE — Assessment & Plan Note (Signed)
Referral to podiatry  

## 2022-08-05 ENCOUNTER — Observation Stay (HOSPITAL_COMMUNITY): Payer: Medicare PPO

## 2022-08-05 ENCOUNTER — Other Ambulatory Visit: Payer: Self-pay

## 2022-08-05 ENCOUNTER — Encounter (HOSPITAL_COMMUNITY): Payer: Self-pay

## 2022-08-05 ENCOUNTER — Emergency Department (HOSPITAL_BASED_OUTPATIENT_CLINIC_OR_DEPARTMENT_OTHER): Payer: Medicare PPO

## 2022-08-05 ENCOUNTER — Other Ambulatory Visit (HOSPITAL_BASED_OUTPATIENT_CLINIC_OR_DEPARTMENT_OTHER): Payer: Self-pay | Admitting: Nurse Practitioner

## 2022-08-05 ENCOUNTER — Emergency Department (HOSPITAL_BASED_OUTPATIENT_CLINIC_OR_DEPARTMENT_OTHER): Payer: Medicare PPO | Admitting: Radiology

## 2022-08-05 ENCOUNTER — Encounter (HOSPITAL_BASED_OUTPATIENT_CLINIC_OR_DEPARTMENT_OTHER): Payer: Self-pay | Admitting: Emergency Medicine

## 2022-08-05 ENCOUNTER — Observation Stay (HOSPITAL_BASED_OUTPATIENT_CLINIC_OR_DEPARTMENT_OTHER)
Admission: EM | Admit: 2022-08-05 | Discharge: 2022-08-06 | Disposition: A | Discharge status: Home / Self Care | Payer: Medicare PPO | Attending: Internal Medicine | Admitting: Internal Medicine

## 2022-08-05 DIAGNOSIS — R0602 Shortness of breath: Secondary | ICD-10-CM | POA: Diagnosis not present

## 2022-08-05 DIAGNOSIS — I10 Essential (primary) hypertension: Secondary | ICD-10-CM | POA: Diagnosis not present

## 2022-08-05 DIAGNOSIS — R55 Syncope and collapse: Principal | ICD-10-CM

## 2022-08-05 DIAGNOSIS — Z87891 Personal history of nicotine dependence: Secondary | ICD-10-CM | POA: Diagnosis not present

## 2022-08-05 DIAGNOSIS — J45909 Unspecified asthma, uncomplicated: Secondary | ICD-10-CM | POA: Diagnosis not present

## 2022-08-05 DIAGNOSIS — R4020 Unspecified coma: Secondary | ICD-10-CM | POA: Diagnosis present

## 2022-08-05 DIAGNOSIS — E1140 Type 2 diabetes mellitus with diabetic neuropathy, unspecified: Secondary | ICD-10-CM | POA: Insufficient documentation

## 2022-08-05 DIAGNOSIS — Z79899 Other long term (current) drug therapy: Secondary | ICD-10-CM | POA: Insufficient documentation

## 2022-08-05 DIAGNOSIS — B372 Candidiasis of skin and nail: Secondary | ICD-10-CM

## 2022-08-05 DIAGNOSIS — R079 Chest pain, unspecified: Secondary | ICD-10-CM | POA: Diagnosis present

## 2022-08-05 DIAGNOSIS — R519 Headache, unspecified: Secondary | ICD-10-CM | POA: Diagnosis not present

## 2022-08-05 DIAGNOSIS — J449 Chronic obstructive pulmonary disease, unspecified: Secondary | ICD-10-CM | POA: Diagnosis not present

## 2022-08-05 DIAGNOSIS — E118 Type 2 diabetes mellitus with unspecified complications: Secondary | ICD-10-CM

## 2022-08-05 DIAGNOSIS — R42 Dizziness and giddiness: Secondary | ICD-10-CM

## 2022-08-05 DIAGNOSIS — R4182 Altered mental status, unspecified: Secondary | ICD-10-CM | POA: Diagnosis not present

## 2022-08-05 DIAGNOSIS — R0789 Other chest pain: Secondary | ICD-10-CM | POA: Diagnosis not present

## 2022-08-05 LAB — URINALYSIS, ROUTINE W REFLEX MICROSCOPIC
Bilirubin Urine: NEGATIVE
Glucose, UA: NEGATIVE mg/dL
Ketones, ur: NEGATIVE mg/dL
Nitrite: NEGATIVE
Protein, ur: NEGATIVE mg/dL
Specific Gravity, Urine: 1.019 (ref 1.005–1.030)
pH: 7 (ref 5.0–8.0)

## 2022-08-05 LAB — TROPONIN I (HIGH SENSITIVITY)
Troponin I (High Sensitivity): 4 ng/L (ref <18)
Troponin I (High Sensitivity): 3 ng/L (ref <18)

## 2022-08-05 LAB — BASIC METABOLIC PANEL
Anion gap: 8 (ref 5–15)
BUN: 13 mg/dL (ref 8–23)
CO2: 26 mmol/L (ref 22–32)
Calcium: 9.2 mg/dL (ref 8.9–10.3)
Chloride: 104 mmol/L (ref 98–111)
Creatinine, Ser: 0.61 mg/dL (ref 0.44–1.00)
GFR, Estimated: >60 mL/min (ref >60)
Glucose, Bld: 95 mg/dL (ref 70–99)
Potassium: 3.9 mmol/L (ref 3.5–5.1)
Sodium: 138 mmol/L (ref 135–145)

## 2022-08-05 LAB — CBC
HCT: 37.5 % (ref 36.0–46.0)
Hemoglobin: 12.5 g/dL (ref 12.0–15.0)
MCH: 26.2 pg (ref 26.0–34.0)
MCHC: 33.3 g/dL (ref 30.0–36.0)
MCV: 78.5 fL — ABNORMAL LOW (ref 80.0–100.0)
Platelets: 273 10*3/uL (ref 150–400)
RBC: 4.78 MIL/uL (ref 3.87–5.11)
RDW: 13.2 % (ref 11.5–15.5)
WBC: 4 10*3/uL (ref 4.0–10.5)
nRBC: 0 % (ref 0.0–0.2)

## 2022-08-05 LAB — BRAIN NATRIURETIC PEPTIDE: B Natriuretic Peptide: 20.6 pg/mL (ref 0.0–100.0)

## 2022-08-05 LAB — D-DIMER, QUANTITATIVE: D-Dimer, Quant: 0.34 ug/mL-FEU (ref 0.00–0.50)

## 2022-08-05 LAB — VITAMIN B12: Vitamin B-12: 364 pg/mL (ref 180–914)

## 2022-08-05 LAB — GLUCOSE, CAPILLARY: Glucose-Capillary: 99 mg/dL (ref 70–99)

## 2022-08-05 LAB — TSH: TSH: 0.904 u[IU]/mL (ref 0.350–4.500)

## 2022-08-05 MED ORDER — FREESTYLE LIBRE 2 READER DEVI: 0 refills | Status: DC

## 2022-08-05 MED ORDER — LORATADINE 10 MG PO TABS
10.0000 mg | ORAL_TABLET | Freq: Every day | ORAL | Status: DC
  Administered 2022-08-06: 10 mg via ORAL
  Filled 2022-08-05: qty 1

## 2022-08-05 MED ORDER — ALBUTEROL SULFATE (2.5 MG/3ML) 0.083% IN NEBU: 2.5000 mg | INHALATION_SOLUTION | Freq: Three times a day (TID) | RESPIRATORY_TRACT | Status: DC | PRN

## 2022-08-05 MED ORDER — BUDESONIDE 0.5 MG/2ML IN SUSP
0.5000 mg | Freq: Two times a day (BID) | RESPIRATORY_TRACT | Status: DC
  Administered 2022-08-05 – 2022-08-06 (×2): 0.5 mg via RESPIRATORY_TRACT
  Filled 2022-08-05 (×2): qty 2

## 2022-08-05 MED ORDER — SODIUM CHLORIDE 0.9% FLUSH
3.0000 mL | Freq: Two times a day (BID) | INTRAVENOUS | Status: DC
  Administered 2022-08-05 – 2022-08-06 (×2): 3 mL via INTRAVENOUS

## 2022-08-05 MED ORDER — GABAPENTIN 600 MG PO TABS
600.0000 mg | ORAL_TABLET | Freq: Three times a day (TID) | ORAL | Status: DC
  Filled 2022-08-05 (×2): qty 1

## 2022-08-05 MED ORDER — LEVOCETIRIZINE DIHYDROCHLORIDE 5 MG PO TABS: 5.0000 mg | ORAL_TABLET | Freq: Every evening | ORAL | Status: DC

## 2022-08-05 MED ORDER — MECLIZINE HCL 25 MG PO TABS: 12.5000 mg | ORAL_TABLET | Freq: Three times a day (TID) | ORAL | Status: DC | PRN

## 2022-08-05 MED ORDER — FLUTICASONE PROPIONATE 50 MCG/ACT NA SUSP
1.0000 | Freq: Every day | NASAL | Status: DC
  Administered 2022-08-06: 1 via NASAL
  Filled 2022-08-05 (×2): qty 16

## 2022-08-05 MED ORDER — LISINOPRIL 5 MG PO TABS
5.0000 mg | ORAL_TABLET | Freq: Every day | ORAL | Status: DC
  Administered 2022-08-05 – 2022-08-06 (×2): 5 mg via ORAL
  Filled 2022-08-05 (×2): qty 1

## 2022-08-05 MED ORDER — BUDESONIDE 0.5 MG/2ML IN SUSP: 0.5000 mg | Freq: Two times a day (BID) | RESPIRATORY_TRACT | Status: DC

## 2022-08-05 MED ORDER — ASPIRIN 81 MG PO TBEC
81.0000 mg | DELAYED_RELEASE_TABLET | Freq: Every day | ORAL | Status: DC
  Administered 2022-08-05 – 2022-08-06 (×2): 81 mg via ORAL
  Filled 2022-08-05 (×2): qty 1

## 2022-08-05 MED ORDER — ALBUTEROL SULFATE HFA 108 (90 BASE) MCG/ACT IN AERS: 1.0000 | INHALATION_SPRAY | Freq: Four times a day (QID) | RESPIRATORY_TRACT | Status: DC | PRN

## 2022-08-05 MED ORDER — MONTELUKAST SODIUM 10 MG PO TABS
5.0000 mg | ORAL_TABLET | Freq: Every day | ORAL | Status: DC
  Administered 2022-08-05: 5 mg via ORAL
  Filled 2022-08-05: qty 1

## 2022-08-05 MED ORDER — ENOXAPARIN SODIUM 40 MG/0.4ML IJ SOSY
40.0000 mg | PREFILLED_SYRINGE | INTRAMUSCULAR | Status: DC
  Administered 2022-08-05 – 2022-08-06 (×2): 40 mg via SUBCUTANEOUS
  Filled 2022-08-05 (×2): qty 0.4

## 2022-08-05 MED ORDER — SODIUM CHLORIDE 0.9 % IV BOLUS
1000.0000 mL | Freq: Once | INTRAVENOUS | Status: AC
  Administered 2022-08-05: 1000 mL via INTRAVENOUS

## 2022-08-05 MED ORDER — MOMETASONE FURO-FORMOTEROL FUM 100-5 MCG/ACT IN AERO
2.0000 | INHALATION_SPRAY | Freq: Two times a day (BID) | RESPIRATORY_TRACT | Status: DC
  Administered 2022-08-06: 2 via RESPIRATORY_TRACT
  Filled 2022-08-05: qty 8.8

## 2022-08-05 MED ORDER — DULOXETINE HCL 30 MG PO CPEP
30.0000 mg | ORAL_CAPSULE | Freq: Every day | ORAL | Status: DC
  Administered 2022-08-06: 30 mg via ORAL
  Filled 2022-08-05: qty 1

## 2022-08-05 MED ORDER — ASPIRIN 81 MG PO CHEW
324.0000 mg | CHEWABLE_TABLET | Freq: Once | ORAL | Status: AC
  Administered 2022-08-05: 324 mg via ORAL
  Filled 2022-08-05: qty 4

## 2022-08-05 NOTE — ED Provider Notes (Signed)
[facility] DEPT Provider Note   CSN: [id number] Arrival date & time: 08/05/22  5809     History  Chief Complaint  Patient presents with   Shortness of Breath   Dizziness    Pam [name] is a 71 y.o. female.  Patient with a history of diabetes, fibromyalgia, anxiety presenting with generalized weakness, lightheadedness, dizziness, chest pain and shortness of breath.  States has been feeling dizzy and lightheaded for about 2 weeks.  She passed out several weeks ago and saw her PCP who referred her to cardiology.  States she lost consciousness in her kitchen and woke up with her dog licking her face.  Has been feeling dizzy and weak since then and prior to that.  She denies any room spinning but feels lightheaded and off-balance.  Today the dizziness increased when she was lying in bed and is associated with some tightness in her chest as well as shortness of breath.  She also has a gradual onset headache that onset yesterday.  No fever.  No focal weakness, numbness or tingling.  No black or bloody stools.  No abdominal pain.  Has had nausea but no vomiting.  She is having difficulty walking today due to feeling generally weak and dizzy.  Tightness in her chest is new.  Shortness of breath is new. Denies any cardiac history reports no history of stents  The history is provided by the patient.  Shortness of Breath Associated symptoms: chest pain   Associated symptoms: no abdominal pain and no vomiting   Dizziness Associated symptoms: chest pain, nausea and shortness of breath   Associated symptoms: no vomiting        Home Medications Prior to Admission medications   Medication Sig Start Date End Date Taking? Authorizing Provider  albuterol (PROVENTIL) (2.5 MG/3ML) 0.083% nebulizer solution Take 3 mLs (2.5 mg total) by nebulization 3 (three) times daily as needed for wheezing or shortness of breath. 03/11/22   Clemon Chambers, MD  albuterol (VENTOLIN HFA) 108 (90  Base) MCG/ACT inhaler TAKE 2 PUFFS BY MOUTH EVERY 6 HOURS AS NEEDED FOR WHEEZE OR SHORTNESS OF BREATH 07/29/22   Clemon Chambers, MD  budesonide (PULMICORT) 0.5 MG/2ML nebulizer solution TAKE 2 MLS (0.5 MG TOTAL) BY NEBULIZATION 2 (TWO) TIMES DAILY. DURING RESPIRATORY FLARES FOR AT LEAST 2 WEEKS OR UNTIL SYMPTOMS RESOLVE. 07/25/22   Clemon Chambers, MD  Continuous Blood Gluc Receiver (FREESTYLE LIBRE 2 READER) DEVI Use to monitor blood sugar continuously. AM reading goal: 70-110. 2 hours after meal reading goal: less than 180 04/03/22   Early, Coralee Pesa, NP  COVID-19 mRNA bivalent vaccine, Moderna, (MODERNA COVID-19 BIVAL BOOSTER) 50 MCG/0.5ML injection Inject into the muscle. 09/11/21   Carlyle Basques, MD  doxycycline (VIBRA-TABS) 100 MG tablet Take 1 tablet (100 mg total) by mouth 2 (two) times daily. 05/23/22   Wallene Huh, DPM  DULoxetine (CYMBALTA) 30 MG capsule Take 1 capsule (30 mg total) by mouth daily. 05/05/22 08/03/22  Orma Render, NP  gabapentin (NEURONTIN) 600 MG tablet Take 1 tablet (600 mg total) by mouth 3 (three) times daily. 04/03/22   Orma Render, NP  levocetirizine (XYZAL) 5 MG tablet TAKE 1 TABLET BY MOUTH EVERY DAY IN THE EVENING 05/01/22   Clemon Chambers, MD  mometasone (NASONEX) 50 MCG/ACT nasal spray PLACE 2 SPRAYS INTO THE NOSE DAILY. 07/30/22   Clemon Chambers, MD  montelukast (SINGULAIR) 10 MG tablet TAKE 1 TABLET BY MOUTH EVERYDAY AT BEDTIME  04/03/22   Orma Render, NP  Multiple Vitamin (MULTIVITAMIN WITH MINERALS) TABS tablet Take 1 tablet by mouth daily.    [provider]  nystatin (MYCOSTATIN/NYSTOP) powder APPLY 1 APPLICATION TOPICALLY 3 (THREE) TIMES DAILY. KEEP AREA CLEAN AND DRY. 07/19/22   Early, Coralee Pesa, NP  OZEMPIC, 0.25 OR 0.5 MG/DOSE, 2 MG/1.5ML SOPN INJECT 0.5 MG UNDER SKIN THE SAME DAY OF EACH WEEK IN ABDOMEN, THIGH, OR UPPER ARM. ROTATE SITE 03/19/22   Early, Coralee Pesa, NP  SYMBICORT 80-4.5 MCG/ACT inhaler Inhale 2 puffs into the lungs in the morning and at  bedtime. 07/24/22   Clemon Chambers, MD      Allergies    Amoxicillin and Multihance [gadobenate]    Review of Systems   Review of Systems  Constitutional:  Positive for fatigue.  Respiratory:  Positive for chest tightness and shortness of breath.   Cardiovascular:  Positive for chest pain.  Gastrointestinal:  Positive for nausea. Negative for abdominal pain and vomiting.  Genitourinary:  Negative for dysuria and hematuria.  Musculoskeletal:  Negative for back pain.  Neurological:  Positive for dizziness.   all other systems are negative except as noted in the HPI and PMH.    Physical Exam Updated Vital Signs BP (!) 149/78 (BP Location: Left Arm)   Pulse 71   Temp 98.9 F (37.2 C) (Oral)   Resp 18   Ht '5\' 1"'$  (1.549 m)   Wt 69.9 kg   SpO2 98%   BMI 29.10 kg/m  Physical Exam Vitals and nursing note reviewed.  Constitutional:      General: She is not in acute distress.    Appearance: She is well-developed.     Comments: Fatigued appearing  HENT:     Head: Normocephalic and atraumatic.     Mouth/Throat:     Pharynx: No oropharyngeal exudate.  Eyes:     Conjunctiva/sclera: Conjunctivae normal.     Pupils: Pupils are equal, round, and reactive to light.     Comments: No conjunctival pallor  Neck:     Comments: No meningismus. Cardiovascular:     Rate and Rhythm: Normal rate and regular rhythm.     Heart sounds: Normal heart sounds. No murmur heard. Pulmonary:     Effort: Pulmonary effort is normal. No respiratory distress.     Breath sounds: Normal breath sounds.  Abdominal:     Palpations: Abdomen is soft.     Tenderness: There is no abdominal tenderness. There is no guarding or rebound.  Musculoskeletal:        General: No tenderness. Normal range of motion.     Cervical back: Normal range of motion and neck supple.  Skin:    General: Skin is warm.  Neurological:     Mental Status: She is alert and oriented to person, place, and time.     Cranial Nerves: No  cranial nerve deficit.     Motor: No abnormal muscle tone.     Coordination: Coordination normal.     Comments: CN 2-12 intact, no ataxia on finger to nose but very slow to complete, no nystagmus, 5/5 strength throughout, no pronator drift,  Positive Romberg.  Gait not tested.  Psychiatric:        Behavior: Behavior normal.     ED Results / Procedures / Treatments   Labs (all labs ordered are listed, but only abnormal results are displayed) Labs Reviewed  CBC - Abnormal; Notable for the following components:  Result Value   MCV 78.5 (*)    All other components within normal limits  URINALYSIS, ROUTINE W REFLEX MICROSCOPIC - Abnormal; Notable for the following components:   Hgb urine dipstick TRACE (*)    Leukocytes,Ua SMALL (*)    All other components within normal limits  BASIC METABOLIC PANEL  BRAIN NATRIURETIC PEPTIDE  D-DIMER, QUANTITATIVE  TROPONIN I (HIGH SENSITIVITY)  TROPONIN I (HIGH SENSITIVITY)    EKG EKG Interpretation  Date/Time:  Monday August 05 2022 08:00:28 EDT Ventricular Rate:  64 PR Interval:  138 QRS Duration: 94 QT Interval:  397 QTC Calculation: 410 R Axis:   22 Text Interpretation: Sinus rhythm No significant change was found Confirmed by Ezequiel Essex [phone]) on 08/05/2022 8:03:39 AM  Radiology CT Head Wo Contrast  Result Date: 08/05/2022 CLINICAL DATA:  Mental status change, unknown cause EXAM: CT HEAD WITHOUT CONTRAST TECHNIQUE: Contiguous axial images were obtained from the base of the skull through the vertex without intravenous contrast. RADIATION DOSE REDUCTION: This exam was performed according to the departmental dose-optimization program which includes automated exposure control, adjustment of the mA and/or kV according to patient size and/or use of iterative reconstruction technique. COMPARISON:  MRI head 10/15/2021 (without report). FINDINGS: Brain: No evidence of acute infarction, hemorrhage, hydrocephalus, extra-axial  collection or mass lesion/mass effect. Vascular: No hyperdense vessel identified. Skull: No acute fracture. Sinuses/Orbits: Clear sinuses.  No acute orbital findings. Other: No mastoid effusions. IMPRESSION: No evidence of acute intracranial abnormality. Electronically Signed   By: Margaretha Sheffield M.D.   On: 08/05/2022 08:36   DG Chest 2 View  Result Date: 08/05/2022 CLINICAL DATA:  Shortness of breath. EXAM: CHEST - 2 VIEW COMPARISON:  Apr 01, 2018. FINDINGS: The heart size and mediastinal contours are within normal limits. Both lungs are clear. The visualized skeletal structures are unremarkable. IMPRESSION: No active cardiopulmonary disease. Electronically Signed   By: Marijo Conception M.D.   On: 08/05/2022 08:32    Procedures Procedures    Medications Ordered in ED Medications  sodium chloride 0.9 % bolus 1,000 mL (has no administration in time range)    ED Course/ Medical Decision Making/ A&P                           Medical Decision Making Amount and/or Complexity of Data Reviewed Labs: ordered. Decision-making details documented in ED Course. Radiology: ordered and independent interpretation performed. Decision-making details documented in ED Course. ECG/medicine tests: ordered and independent interpretation performed. Decision-making details documented in ED Course.  Risk OTC drugs. Decision regarding hospitalization.  Ongoing dizziness and lightheadedness for several weeks.  Now with chest tightness, nausea, headache and shortness of breath.  EKG is sinus rhythm.  No acute ST changes.  No Brugada no prolonged QT.  Neurological exam is nonfocal.  Aspirin given.  Orthostatics are negative.  Patient given p.o. fluids and IV fluids.  Labs are reassuring with stable hemoglobin, electrolytes and creatinine.  Troponin negative, D-dimer negative.  Low suspicion for pulmonary embolism.  CT head shows no acute findings.  Patient has had ongoing dizziness and lightheadedness for  several weeks that sounds positional.  States she gets spinning dizziness occasionally when she turns her head or gets up too quickly.  She is having some concerning chest pain, shortness of breath and dizziness since this morning but cardiac work-up thus far is reassuring.  Heart score is 4.  No recent ischemic testing in system  Unclear whether this  presents ACS.  Initial troponin is negative.  Heart score is 4.  She does have some positional dizziness and lightheadedness with changing positions.  Nonfocal neurological exam.  Given her ongoing chest pain, shortness of breath, dizziness, we will plan admission for further cardiac rule out.  Also obtaining brain MRI to rule out stroke given her dizziness and left arm tingling.  Admission to Dr. Tamala Julian.        Final Clinical Impression(s) / ED Diagnoses Final diagnoses:  None    Rx / DC Orders ED Discharge Orders     None         Walker Paddack, Annie Main, MD 08/05/22 1031

## 2022-08-05 NOTE — ED Notes (Signed)
Pt reports increased dizziness when changing positions

## 2022-08-05 NOTE — ED Triage Notes (Signed)
Pt arrives to ED with c/o shortness of breath and dizziness. Pt reports new onset left arm tingling. Dizziness started last night and SOB/arm tingling started this morning.
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
## 2022-08-05 NOTE — ED Notes (Signed)
Called [facility] to tx pt [facility]

## 2022-08-05 NOTE — Progress Notes (Signed)
Patient refuses to take Cymbalta or Gabapentin. She states it make her loopy. She stated that she has made her PCP aware of these side effects and notified the PCP that she will no longer be taking the medications listed above.

## 2022-08-06 ENCOUNTER — Other Ambulatory Visit (INDEPENDENT_AMBULATORY_CARE_PROVIDER_SITE_OTHER): Payer: Medicare PPO

## 2022-08-06 ENCOUNTER — Other Ambulatory Visit (HOSPITAL_COMMUNITY): Payer: Self-pay | Admitting: Student

## 2022-08-06 ENCOUNTER — Observation Stay (HOSPITAL_BASED_OUTPATIENT_CLINIC_OR_DEPARTMENT_OTHER): Payer: Medicare PPO

## 2022-08-06 DIAGNOSIS — R072 Precordial pain: Secondary | ICD-10-CM

## 2022-08-06 DIAGNOSIS — R55 Syncope and collapse: Secondary | ICD-10-CM

## 2022-08-06 DIAGNOSIS — R079 Chest pain, unspecified: Secondary | ICD-10-CM | POA: Diagnosis not present

## 2022-08-06 LAB — ECHOCARDIOGRAM COMPLETE
AR max vel: 1.59 cm2
AV Area VTI: 1.6 cm2
AV Area mean vel: 1.5 cm2
AV Mean grad: 3 mmHg
AV Peak grad: 6 mmHg
Ao pk vel: 1.22 m/s
Area-P 1/2: 2.95 cm2
Calc EF: 59.7 %
Height: 61 in
S' Lateral: 2.6 cm
Single Plane A2C EF: 58.9 %
Single Plane A4C EF: 63.1 %
Weight: 2457.6 oz

## 2022-08-06 LAB — GLUCOSE, CAPILLARY
Glucose-Capillary: 89 mg/dL (ref 70–99)
Glucose-Capillary: 139 mg/dL — ABNORMAL HIGH (ref 70–99)
Glucose-Capillary: 142 mg/dL — ABNORMAL HIGH (ref 70–99)
Glucose-Capillary: 146 mg/dL — ABNORMAL HIGH (ref 70–99)

## 2022-08-06 LAB — LIPID PANEL
Cholesterol: 172 mg/dL (ref 0–200)
HDL: 38 mg/dL — ABNORMAL LOW (ref >40)
LDL Cholesterol: 110 mg/dL — ABNORMAL HIGH (ref 0–99)
Total CHOL/HDL Ratio: 4.5 RATIO
Triglycerides: 119 mg/dL (ref <150)
VLDL: 24 mg/dL (ref 0–40)

## 2022-08-06 MED ORDER — PERFLUTREN LIPID MICROSPHERE
1.0000 mL | INTRAVENOUS | Status: AC | PRN
  Administered 2022-08-06: 2 mL via INTRAVENOUS
  Filled 2022-08-06: qty 10

## 2022-08-06 MED ORDER — ONDANSETRON HCL 4 MG PO TABS: 4.0000 mg | ORAL_TABLET | Freq: Once | ORAL | 0 refills | Status: DC | PRN

## 2022-08-06 MED ORDER — LISINOPRIL 5 MG PO TABS: 5.0000 mg | ORAL_TABLET | Freq: Every day | ORAL | 0 refills | Status: DC

## 2022-08-06 MED ORDER — OZEMPIC (0.25 OR 0.5 MG/DOSE) 2 MG/1.5ML ~~LOC~~ SOPN: 0.5000 mg | PEN_INJECTOR | SUBCUTANEOUS | Status: DC

## 2022-08-06 MED ORDER — MECLIZINE HCL 12.5 MG PO TABS: 12.5000 mg | ORAL_TABLET | Freq: Three times a day (TID) | ORAL | 0 refills | Status: DC | PRN

## 2022-08-06 MED ORDER — ASPIRIN 81 MG PO TBEC: 81.0000 mg | DELAYED_RELEASE_TABLET | Freq: Every day | ORAL | 0 refills | Status: AC

## 2022-08-06 MED ORDER — NYSTATIN 100000 UNIT/GM EX POWD: 1.0000 | CUTANEOUS | Status: DC | PRN

## 2022-08-06 MED ORDER — METOPROLOL TARTRATE 50 MG PO TABS: 50.0000 mg | ORAL_TABLET | Freq: Once | ORAL | 0 refills | Status: DC

## 2022-08-06 MED ORDER — MOMETASONE FUROATE 50 MCG/ACT NA SUSP: 2.0000 | Freq: Every day | NASAL | Status: DC | PRN

## 2022-08-06 MED ORDER — NITROGLYCERIN 0.4 MG SL SUBL: 0.4000 mg | SUBLINGUAL_TABLET | SUBLINGUAL | 0 refills | Status: DC | PRN

## 2022-08-06 NOTE — Progress Notes (Signed)
  Echocardiogram 2D Echocardiogram has been performed.  Lana Fish 08/06/2022, 9:36 AM

## 2022-08-06 NOTE — Evaluation (Signed)
Physical Therapy Evaluation Patient Details Name: Pam [name] MRN: [id number] DOB: 12-25-1950 Today's Date: 08/06/2022  History of Present Illness  Pt is 71 year old presented to [facility] on  9/25 with syncope and chest pain. CT and MRI negative. Pt has been having dizziness and light headedness for about 2 weeks with syncopal and near syncopal episodes. PMH - DM, copd, neuropathy, fibromyalgia, asthma, arthritis  Clinical Impression  Pt up in room independently with her cane on arrival. Pt with c/o's of lightheadedness on standing initially. Pt able to pick up socks from floor and able to stand on 1 foot with single UE support to adjust shoes. Had pt perform head turns in setting and no nystagmus noted. Amb in hallway and used rollator for incr confidence in open environment. Pt became nauseous and took her back to room. Nurse notified. From a PT standpoint pt can return home when medically ready. She has been seeing OPPT for her hip and back pain and can resume that after dc.       Recommendations for follow up therapy are one component of a multi-disciplinary discharge planning process, led by the attending physician.  Recommendations may be updated based on patient status, additional functional criteria and insurance authorization.  Follow Up Recommendations Outpatient PT (resume OPPT)      Assistance Recommended at Discharge PRN  Patient can return home with the following       Equipment Recommendations None recommended by PT  Recommendations for Other Services       Functional Status Assessment Patient has not had a recent decline in their functional status     Precautions / Restrictions Precautions Precautions: Fall (due to syncope)      Mobility  Bed Mobility               General bed mobility comments: Pt up in room on her own so assume independent bed mobility    Transfers Overall transfer level: Modified independent Equipment used: Straight cane                     Ambulation/Gait Ambulation/Gait assistance: Modified independent (Device/Increase time), Supervision Gait Distance (Feet): 120 Feet (120' x 1, 30' x1) Assistive device: Straight cane, Rollator (4 wheels) Gait Pattern/deviations: Step-through pattern, Decreased stride length Gait velocity: decr Gait velocity interpretation: <1.31 ft/sec, indicative of household ambulator   General Gait Details: Pt up amb in room with cane. Gait measured and careful in hallway. Used rollator for incr confidence. Pt became nauseous and rolled her back to room on rollator  Stairs            Wheelchair Mobility    Modified Rankin (Stroke Patients Only)       Balance Overall balance assessment: Needs assistance Sitting-balance support: No upper extremity supported, Feet supported Sitting balance-Leahy Scale: Normal     Standing balance support: No upper extremity supported, During functional activity Standing balance-Leahy Scale: Good Standing balance comment: Able to reach to floor to pick up socks without instability. Able to stand on 1 foot with single upper extremity support to pull on shoes                             Pertinent Vitals/Pain Pain Assessment Pain Location: chronic fibromyalgia, hip, neck    Home Living Family/patient expects to be discharged to:: Private residence Living Arrangements: Non-relatives/Friends;Alone (a friend has been staying with her) Available Help at Discharge: Friend(s);Available PRN/intermittently  Type of Home: House (town home) Home Access: Stairs to enter   Technical brewer of Steps: 2 Alternate Level Stairs-Number of Steps: flight Home Layout: Two level;1/2 bath on main level;Bed/bath upstairs Home Equipment: Cane - single point (hiking poles as well)      Prior Function Prior Level of Function : Independent/Modified Independent             Mobility Comments: No assistive device in home. Hiking poles or cane  when out ADLs Comments: Uses instacart for groceries     Hand Dominance        Extremity/Trunk Assessment   Upper Extremity Assessment Upper Extremity Assessment: Overall WFL for tasks assessed    Lower Extremity Assessment Lower Extremity Assessment: Overall WFL for tasks assessed       Communication   Communication: No difficulties  Cognition Arousal/Alertness: Awake/alert Behavior During Therapy: WFL for tasks assessed/performed Overall Cognitive Status: Within Functional Limits for tasks assessed                                          General Comments General comments (skin integrity, edema, etc.): VSS with BP 110's/60's after pt reporting lightheadedness    Exercises     Assessment/Plan    PT Assessment All further PT needs can be met in the next venue of care  PT Problem List Pain (chronic pain)       PT Treatment Interventions      PT Goals (Current goals can be found in the Care Plan section)  Acute Rehab PT Goals Patient Stated Goal: return home PT Goal Formulation: All assessment and education complete, DC therapy    Frequency       Co-evaluation               AM-PAC PT "6 Clicks" Mobility  Outcome Measure Help needed turning from your back to your side while in a flat bed without using bedrails?: None Help needed moving from lying on your back to sitting on the side of a flat bed without using bedrails?: None Help needed moving to and from a bed to a chair (including a wheelchair)?: None Help needed standing up from a chair using your arms (e.g., wheelchair or bedside chair)?: None Help needed to walk in hospital room?: None Help needed climbing 3-5 steps with a railing? : None 6 Click Score: 24    End of Session   Activity Tolerance: Other (comment) (limited by nausea) Patient left: in chair;with call bell/phone within reach Nurse Communication: Mobility status;Other (comment) (nausea) PT Visit Diagnosis: Other  abnormalities of gait and mobility (R26.89)    Time: 2883-3744 PT Time Calculation (min) (ACUTE ONLY): 32 min   Charges:   PT Evaluation $PT Eval Low Complexity: 1 Low PT Treatments $Gait Training: 8-22 mins        [place] [facility] [place] 08/06/2022, 12:17 PM

## 2022-08-06 NOTE — Discharge Summary (Signed)
Physician Discharge Summary  Pam [name] WUJ:[id number] DOB: 11-12-1950 DOA: 08/05/2022  PCP: Orma Render, NP  Admit date: 08/05/2022 Discharge date: 08/06/2022  Admitted From: Home Disposition: Home  Recommendations for Outpatient Follow-up:  Follow up with PCP in 1 week with repeat CBC/BMP Follow up in ED if symptoms worsen or new appear   Home Health: No Equipment/Devices: None  Discharge Condition: Stable CODE STATUS: Full Diet recommendation: Heart healthy  Brief/Interim Summary: 71 y.o. female with medical history significant of DM type II, COPD , diabetic neuropathy, fibromyalgia, chronic dizziness leading to neurology evaluation and negative MRI of brain in December 2022 presented with syncope episode and chest pain.  On presentation, she was mildly bradycardic.  CT of the head was negative for acute abnormality.  Troponins x2 were negative.  EKG showed no ischemic changes.  During the hospitalization, her condition has improved.  MRI of brain was negative for acute stroke.  Cardiology was consulted.  2D echo was done. Cardiology has cleared her for discharge. Patient has tolerated physical therapy.  No further syncopal episodes or chest pain.  She will be discharged home with outpatient follow-up with PCP and cardiology.  Discharge Diagnoses:   Syncope and recurrent near syncopes -Questionable cause. -No further syncope since hospital admission.  Patient has tolerated PT well.  Will need outpatient PT. -MRI of brain was negative for acute stroke. -2D echo showed EF of 60 to 65% with grade 1 diastolic dysfunction.  Cardiology has evaluated the patient and cleared her for discharge.  Will need cardiology follow-up on discharge.  Chest pain -Atypical.  Troponin is negative.  EKG nonischemic.  Cardiology has cleared her for discharge.  Continue aspirin 81 mg daily.  Outpatient follow-up with cardiology  Hypertension -Not on antihypertensives as an outpatient.  Has been  started on lisinopril during this hospitalization.  Continue the same on discharge.  Outpatient follow-up  Diabetes mellitus type 2 -Continue Ozempic weekly  Diabetic neuropathy -Patient apparently does not take gabapentin or Cymbalta  COPD -Stable.  Continue home regimen.  Outpatient follow-up with PCP  Discharge Instructions   Allergies as of 08/06/2022       Reactions   Ivp Dye [iodinated Contrast Media] Other (See Comments)   unk   Penicillins Other (See Comments)   Diarrhea, rash   Amoxicillin Diarrhea, Rash, Other (See Comments)   Multihance [gadobenate] Nausea And Vomiting   Pt had some nausea and vomiting immediately after contrast administered.        Medication List     STOP taking these medications    DULoxetine 20 MG capsule Commonly known as: CYMBALTA   DULoxetine 30 MG capsule Commonly known as: CYMBALTA   famotidine 20 MG tablet Commonly known as: PEPCID   gabapentin 600 MG tablet Commonly known as: Neurontin   Moderna COVID-19 Bival Booster 50 MCG/0.5ML injection Generic drug: COVID-19 mRNA bivalent vaccine (Moderna)       TAKE these medications    acetaminophen 500 MG tablet Commonly known as: TYLENOL Take 500 mg by mouth as needed for mild pain or moderate pain.   albuterol (2.5 MG/3ML) 0.083% nebulizer solution Commonly known as: PROVENTIL Take 3 mLs (2.5 mg total) by nebulization 3 (three) times daily as needed for wheezing or shortness of breath. What changed: Another medication with the same name was changed. Make sure you understand how and when to take each.   albuterol 108 (90 Base) MCG/ACT inhaler Commonly known as: VENTOLIN HFA TAKE 2 PUFFS BY MOUTH EVERY 6  HOURS AS NEEDED FOR WHEEZE OR SHORTNESS OF BREATH What changed: See the new instructions.   aspirin EC 81 MG tablet Take 1 tablet (81 mg total) by mouth daily. Swallow whole. Start taking on: August 07, 2022   budesonide 0.5 MG/2ML nebulizer solution Commonly  known as: PULMICORT TAKE 2 MLS (0.5 MG TOTAL) BY NEBULIZATION 2 (TWO) TIMES DAILY. DURING RESPIRATORY FLARES FOR AT LEAST 2 WEEKS OR UNTIL SYMPTOMS RESOLVE.   cyanocobalamin 100 MCG tablet Commonly known as: VITAMIN B12 Take 100 mcg by mouth daily.   FreeStyle Libre 2 Reader [place] Use to monitor blood sugar continuously. AM reading goal: 70-110. 2 hours after meal reading goal: less than 180   levocetirizine 5 MG tablet Commonly known as: XYZAL TAKE 1 TABLET BY MOUTH EVERY DAY IN THE EVENING What changed:  how much to take how to take this when to take this additional instructions   lisinopril 5 MG tablet Commonly known as: ZESTRIL Take 1 tablet (5 mg total) by mouth daily. Start taking on: August 07, 2022   meclizine 12.5 MG tablet Commonly known as: ANTIVERT Take 1 tablet (12.5 mg total) by mouth 3 (three) times daily as needed for dizziness.   meloxicam 15 MG tablet Commonly known as: MOBIC Take 15 mg by mouth daily.   metoprolol tartrate 50 MG tablet Commonly known as: Lopressor Take 1 tablet (50 mg total) by mouth once for 1 dose. Take 2 hours prior to coronary CTA.   mometasone 50 MCG/ACT nasal spray Commonly known as: NASONEX Place 2 sprays into the nose daily as needed (as needed).   montelukast 10 MG tablet Commonly known as: SINGULAIR TAKE 1 TABLET BY MOUTH EVERYDAY AT BEDTIME What changed:  how much to take how to take this when to take this additional instructions   multivitamin with minerals Tabs tablet Take 1 tablet by mouth daily.   nitroGLYCERIN 0.4 MG SL tablet Commonly known as: NITROSTAT Place 1 tablet (0.4 mg total) under the tongue every 5 (five) minutes as needed for chest pain.   nystatin powder Commonly known as: MYCOSTATIN/NYSTOP Apply 1 Application topically as needed (when sweating as needed). Keep area clean and dry.   ondansetron 4 MG tablet Commonly known as: Zofran Take 1 tablet (4 mg total) by mouth once as needed for up  to 1 dose for nausea or vomiting (take one tablet before leaving to have your coronary CTA done).   Ozempic (0.25 or 0.5 MG/DOSE) 2 MG/1.5ML Sopn Generic drug: Semaglutide(0.25 or 0.'5MG'$ /DOS) Inject 0.5 mg into the skin once a week. INJECT 0.5 MG UNDER SKIN THE SAME DAY OF EACH WEEK IN ABDOMEN, THIGH, OR UPPER ARM. ROTATE SITE   Symbicort 80-4.5 MCG/ACT inhaler Generic drug: budesonide-formoterol Inhale 2 puffs into the lungs in the morning and at bedtime.   SYSTANE FREE OP Apply 2 drops to eye daily as needed (both eyes as needed).   vitamin C 100 MG tablet Take 100 mg by mouth daily.         Follow-up Information     Early, Coralee Pesa, NP. Schedule an appointment as soon as possible for a visit in 1 week(s).   Specialty: Nurse Practitioner Contact information: [address] [address] [phone]                Allergies  Allergen Reactions   Ivp Dye [Iodinated Contrast Media] Other (See Comments)    unk   Penicillins Other (See Comments)    Diarrhea,  rash   Amoxicillin Diarrhea, Rash and Other (See Comments)   Multihance [Gadobenate] Nausea And Vomiting    Pt had some nausea and vomiting immediately after contrast administered.    Consultations: Cardiology   Procedures/Studies: ECHOCARDIOGRAM COMPLETE  Result Date: 08/06/2022    ECHOCARDIOGRAM REPORT   Patient Name:   SONAKSHI ROLLAND Date of Exam: 08/06/2022 Medical Rec #:  [id number]     Height:       61.0 in Accession #:    [id number]    Weight:       153.6 lb Date of Birth:  [birth year]     BSA:          1.688 m Patient Age:    29 years      BP:           159/90 mmHg Patient Gender: F             HR:           57 bpm. Exam Location:  Inpatient Procedure: 2D Echo and Intracardiac Opacification Agent Indications:    syncope  History:        Patient has prior history of Echocardiogram examinations, most                 recent 02/25/2012. Signs/Symptoms:Chest Pain and Syncope; Risk                  Factors:Diabetes.  Sonographer:    Harvie Junior Referring Phys: [id number] PING T [place]  Sonographer Comments: Technically difficult study due to poor echo windows and patient is obese. Image acquisition challenging due to patient body habitus. IMPRESSIONS  1. Left ventricular ejection fraction, by estimation, is 60 to 65%. The left ventricle has normal function. The left ventricle has no regional wall motion abnormalities. There is mild concentric left ventricular hypertrophy. Left ventricular diastolic parameters are consistent with Grade I diastolic dysfunction (impaired relaxation).  2. Right ventricular systolic function is normal. The right ventricular size is normal. There is normal pulmonary artery systolic pressure.  3. The mitral valve is abnormal- Caseous mitral annular calcification; this may be a benign finding. No evidence of mitral valve regurgitation. No evidence of mitral stenosis. Severe mitral annular calcification.  4. The aortic valve was not well visualized. Aortic valve regurgitation is not visualized. Comparison(s): No significant change from prior study. Unable to access 2013 study. FINDINGS  Left Ventricle: Left ventricular ejection fraction, by estimation, is 60 to 65%. The left ventricle has normal function. The left ventricle has no regional wall motion abnormalities. Definity contrast agent was given IV to delineate the left ventricular  endocardial borders. The left ventricular internal cavity size was normal in size. There is mild concentric left ventricular hypertrophy. Left ventricular diastolic parameters are consistent with Grade I diastolic dysfunction (impaired relaxation). Right Ventricle: The right ventricular size is normal. No increase in right ventricular wall thickness. Right ventricular systolic function is normal. There is normal pulmonary artery systolic pressure. The tricuspid regurgitant velocity is 2.10 m/s, and  with an assumed right atrial pressure of 3 mmHg,  the estimated right ventricular systolic pressure is 94.8 mmHg. Left Atrium: Left atrial size was normal in size. Right Atrium: Right atrial size was normal in size. Pericardium: There is no evidence of pericardial effusion. Mitral Valve: The mitral valve is abnormal. Severe mitral annular calcification. No evidence of mitral valve regurgitation. No evidence of mitral valve stenosis. Tricuspid Valve: The tricuspid valve is normal in structure. Tricuspid valve regurgitation is  not demonstrated. No evidence of tricuspid stenosis. Aortic Valve: The aortic valve was not well visualized. Aortic valve regurgitation is not visualized. Aortic valve mean gradient measures 3.0 mmHg. Aortic valve peak gradient measures 6.0 mmHg. Aortic valve area, by VTI measures 1.60 cm. Pulmonic Valve: The pulmonic valve was normal in structure. Pulmonic valve regurgitation is not visualized. No evidence of pulmonic stenosis. Aorta: The aortic root and ascending aorta are structurally normal, with no evidence of dilitation. IAS/Shunts: No atrial level shunt detected by color flow Doppler.  LEFT VENTRICLE PLAX 2D LVIDd:         3.90 cm     Diastology LVIDs:         2.60 cm     LV e' medial:    6.74 cm/s LV PW:         1.10 cm     LV E/e' medial:  10.4 LV IVS:        1.20 cm     LV e' lateral:   11.20 cm/s LVOT diam:     1.60 cm     LV E/e' lateral: 6.3 LV SV:         44 LV SV Index:   26 LVOT Area:     2.01 cm  LV Volumes (MOD) LV vol d, MOD A2C: 41.6 ml LV vol d, MOD A4C: 52.9 ml LV vol s, MOD A2C: 17.1 ml LV vol s, MOD A4C: 19.5 ml LV SV MOD A2C:     24.5 ml LV SV MOD A4C:     52.9 ml LV SV MOD BP:      28.5 ml RIGHT VENTRICLE RV Basal diam:  3.00 cm RV Mid diam:    2.90 cm RV S prime:     10.90 cm/s TAPSE (M-mode): 2.1 cm LEFT ATRIUM             Index        RIGHT ATRIUM          Index LA diam:        3.30 cm 1.95 cm/m   RA Area:     8.68 cm LA Vol (A2C):   32.1 ml 19.01 ml/m  RA Volume:   15.90 ml 9.42 ml/m LA Vol (A4C):   25.9 ml  15.34 ml/m LA Biplane Vol: 29.0 ml 17.18 ml/m  AORTIC VALVE                    PULMONIC VALVE AV Area (Vmax):    1.59 cm     PV Vmax:       0.83 m/s AV Area (Vmean):   1.50 cm     PV Peak grad:  2.8 mmHg AV Area (VTI):     1.60 cm AV Vmax:           122.00 cm/s AV Vmean:          80.700 cm/s AV VTI:            0.274 m AV Peak Grad:      6.0 mmHg AV Mean Grad:      3.0 mmHg LVOT Vmax:         96.40 cm/s LVOT Vmean:        60.300 cm/s LVOT VTI:          0.218 m LVOT/AV VTI ratio: 0.80  AORTA Ao Root diam: 2.70 cm Ao Asc diam:  2.80 cm MITRAL VALVE  TRICUSPID VALVE MV Area (PHT): 2.95 cm    TR Peak grad:   17.6 mmHg MV Decel Time: 257 msec    TR Vmax:        210.00 cm/s MV E velocity: 70.20 cm/s MV A velocity: 95.90 cm/s  SHUNTS MV E/A ratio:  0.73        Systemic VTI:  0.22 m                            Systemic Diam: 1.60 cm Rudean Haskell MD Electronically signed by Rudean Haskell MD Signature Date/Time: 08/06/2022/12:10:52 PM    Final    MR BRAIN WO CONTRAST  Result Date: 08/05/2022 CLINICAL DATA:  Syncopal episode and chest pain, dizziness EXAM: MRI HEAD WITHOUT CONTRAST TECHNIQUE: Multiplanar, multiecho pulse sequences of the brain and surrounding structures were obtained without intravenous contrast. COMPARISON:  10/15/2021 MRI head, correlation is also made with CT head 08/05/2022 FINDINGS: Brain: No restricted diffusion to suggest acute or subacute infarct. No acute hemorrhage, mass, mass effect, or midline shift. No hydrocephalus or extra-axial collection. No hemosiderin deposition to suggest remote hemorrhage. Cerebral volume is within normal limits for age. Vascular: Normal arterial flow voids. Skull and upper cervical spine: Hyperostosis frontalis. Normal marrow signal. Sinuses/Orbits: No acute finding. Other: Trace fluid in the right mastoid tip. IMPRESSION: No acute intracranial process. No evidence of acute or subacute infarct. Electronically Signed   By: Merilyn Baba  M.D.   On: 08/05/2022 20:39   CT Head Wo Contrast  Result Date: 08/05/2022 CLINICAL DATA:  Mental status change, unknown cause EXAM: CT HEAD WITHOUT CONTRAST TECHNIQUE: Contiguous axial images were obtained from the base of the skull through the vertex without intravenous contrast. RADIATION DOSE REDUCTION: This exam was performed according to the departmental dose-optimization program which includes automated exposure control, adjustment of the mA and/or kV according to patient size and/or use of iterative reconstruction technique. COMPARISON:  MRI head 10/15/2021 (without report). FINDINGS: Brain: No evidence of acute infarction, hemorrhage, hydrocephalus, extra-axial collection or mass lesion/mass effect. Vascular: No hyperdense vessel identified. Skull: No acute fracture. Sinuses/Orbits: Clear sinuses.  No acute orbital findings. Other: No mastoid effusions. IMPRESSION: No evidence of acute intracranial abnormality. Electronically Signed   By: Margaretha Sheffield M.D.   On: 08/05/2022 08:36   DG Chest 2 View  Result Date: 08/05/2022 CLINICAL DATA:  Shortness of breath. EXAM: CHEST - 2 VIEW COMPARISON:  Apr 01, 2018. FINDINGS: The heart size and mediastinal contours are within normal limits. Both lungs are clear. The visualized skeletal structures are unremarkable. IMPRESSION: No active cardiopulmonary disease. Electronically Signed   By: Marijo Conception M.D.   On: 08/05/2022 08:32      Subjective: Patient seen and examined at bedside.  Denies any current chest pain.  No fever, nausea, vomiting or worsening shortness of breath reported.  Discharge Exam: Vitals:   08/06/22 0400 08/06/22 0921  BP:  (!) 124/55  Pulse:    Resp: 16   Temp: 97.9 F (36.6 C)   SpO2: 100%     General: Pt is alert, awake, not in acute distress Cardiovascular: rate controlled, S1/S2 + Respiratory: bilateral decreased breath sounds at bases Abdominal: Soft, NT, ND, bowel sounds + Extremities: no edema, no  cyanosis    The results of significant diagnostics from this hospitalization (including imaging, microbiology, ancillary and laboratory) are listed below for reference.     Microbiology: No results found for this or  any previous visit (from the past 240 hour(s)).   Labs: BNP (last 3 results) Recent Labs    08/05/22 0816  BNP 32.2   Basic Metabolic Panel: Recent Labs  Lab 08/05/22 0803  NA 138  K 3.9  CL 104  CO2 26  GLUCOSE 95  BUN 13  CREATININE 0.61  CALCIUM 9.2   Liver Function Tests: No results for input(s): "AST", "ALT", "ALKPHOS", "BILITOT", "PROT", "ALBUMIN" in the last 168 hours. No results for input(s): "LIPASE", "AMYLASE" in the last 168 hours. No results for input(s): "AMMONIA" in the last 168 hours. CBC: Recent Labs  Lab 08/05/22 0803  WBC 4.0  HGB 12.5  HCT 37.5  MCV 78.5*  PLT 273   Cardiac Enzymes: No results for input(s): "CKTOTAL", "CKMB", "CKMBINDEX", "TROPONINI" in the last 168 hours. BNP: Invalid input(s): "POCBNP" CBG: Recent Labs  Lab 08/05/22 2055 08/06/22 0612 08/06/22 0803 08/06/22 1150  GLUCAP 99 89 139* 142*   D-Dimer Recent Labs    08/05/22 0816  DDIMER 0.34   Hgb A1c No results for input(s): "HGBA1C" in the last 72 hours. Lipid Profile Recent Labs    08/06/22 0230  CHOL 172  HDL 38*  LDLCALC 110*  TRIG 119  CHOLHDL 4.5   Thyroid function studies Recent Labs    08/05/22 1609  TSH 0.904   Anemia work up Recent Labs    08/05/22 1609  VITAMINB12 364   Urinalysis    Component Value Date/Time   COLORURINE YELLOW 08/05/2022 0816   APPEARANCEUR CLEAR 08/05/2022 0816   LABSPEC 1.019 08/05/2022 0816   PHURINE 7.0 08/05/2022 0816   GLUCOSEU NEGATIVE 08/05/2022 0816   HGBUR TRACE (A) 08/05/2022 0816   BILIRUBINUR NEGATIVE 08/05/2022 0816   BILIRUBINUR negative 07/16/2017 1008   KETONESUR NEGATIVE 08/05/2022 0816   PROTEINUR NEGATIVE 08/05/2022 0816   UROBILINOGEN 0.2 07/16/2017 1008   UROBILINOGEN  0.2 07/07/2014 1655   NITRITE NEGATIVE 08/05/2022 0816   LEUKOCYTESUR SMALL (A) 08/05/2022 0816   Sepsis Labs Recent Labs  Lab 08/05/22 0803  WBC 4.0   Microbiology No results found for this or any previous visit (from the past 240 hour(s)).   Time coordinating discharge: 35 minutes  SIGNED:   Aline August, MD  [facility] 08/06/2022, 12:51 PM

## 2022-08-06 NOTE — Discharge Instructions (Addendum)
   Your cardiac CT will be scheduled at one of the below locations:   [facility] [address] [place], [place] [address] [phone]   If scheduled at [facility], please arrive at the [facility] and Children's Entrance (Entrance C2) of [facility] 30 minutes prior to test start time. You can use the FREE valet parking offered at entrance C (encouraged to control the heart rate for the test)  Proceed to the [facility] Radiology Department (first floor) to check-in and test prep.  All radiology patients and guests should use entrance C2 at [facility], accessed from [facility], even though the hospital's physical address listed is [address]      Please follow these instructions carefully (unless otherwise directed):  On the Night Before the Test: Be sure to Drink plenty of water. Do not consume any caffeinated/decaffeinated beverages or chocolate 12 hours prior to your test. Do not take any antihistamines 12 hours prior to your test.   On the Day of the Test: Drink plenty of water until 1 hour prior to the test. You may take your regular medications prior to the test.  Take Metoprolol tartrate (Lopressor) '50mg'$  two hours prior to test. Take Zofran before leaving your house to come for the coronary CTA. FEMALES- please wear underwire-free bra if available, avoid dresses & tight clothing      After the Test: Drink plenty of water. After receiving IV contrast, you may experience a mild flushed feeling. This is normal. On occasion, you may experience a mild rash up to 24 hours after the test. This is not dangerous. If this occurs, you can take Benadryl 25 mg and increase your fluid intake. If you experience trouble breathing, this can be serious. If it is severe call 911 IMMEDIATELY. If it is mild, please call our office. If you take any of these medications: Glipizide/Metformin, Avandament, Glucavance, please do not  take 48 hours after completing test unless otherwise instructed.  We will call to schedule your test 2-4 weeks out understanding that some insurance companies will need an authorization prior to the service being performed.   For non-scheduling related questions, please contact the cardiac imaging nurse navigator should you have any questions/concerns: Marchia Bond, Cardiac Imaging Nurse Navigator Gordy Clement, Cardiac Imaging Nurse Navigator [facility] Direct Office Dial: [phone]   For scheduling needs, including cancellations and rescheduling, please call [place], [phone].

## 2022-08-06 NOTE — Progress Notes (Unsigned)
Enrolled for [employer] to mail a ZIO AT Live Telemetry monitor to patients address on file.   Dr. Harding to read. 

## 2022-08-06 NOTE — Progress Notes (Signed)
Ordered 2 week live Zio monitor for further evaluation of syncope and coronary CTA for further evaluation of chest pain. Please see consult note from today for more information.  Darreld Mclean, PA-C 08/06/2022 4:40 PM

## 2022-08-06 NOTE — Consult Note (Addendum)
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 provide prescription for metoprolol prior to her CT scan and also.  NTG. Other recommendations (labs, testing, etc): Outpatient tests ordered as noted above Follow up as an outpatient: Will be arranged prior to discharge.     Leonie Man, MD, MS Glenetta Hew, M.D., M.S. Interventional Cardiologist  [facility]  Pager # [phone] Phone # [phone] [address] [place], [place] [address]

## 2022-08-06 NOTE — Plan of Care (Signed)
  Problem: Education: Goal: Knowledge of General Education information will improve Description: Including pain rating scale, medication(s)/side effects and non-pharmacologic comfort measures Outcome: Adequate for Discharge   Problem: Health Behavior/Discharge Planning: Goal: Ability to manage health-related needs will improve Outcome: Adequate for Discharge   Problem: Clinical Measurements: Goal: Ability to maintain clinical measurements within normal limits will improve Outcome: Adequate for Discharge Goal: Will remain free from infection Outcome: Adequate for Discharge Goal: Diagnostic test results will improve Outcome: Adequate for Discharge Goal: Respiratory complications will improve Outcome: Adequate for Discharge Goal: Cardiovascular complication will be avoided Outcome: Adequate for Discharge   Problem: Activity: Goal: Risk for activity intolerance will decrease Outcome: Adequate for Discharge   Problem: Nutrition: Goal: Adequate nutrition will be maintained Outcome: Adequate for Discharge   Problem: Coping: Goal: Level of anxiety will decrease Outcome: Adequate for Discharge   Problem: Elimination: Goal: Will not experience complications related to bowel motility Outcome: Adequate for Discharge Goal: Will not experience complications related to urinary retention Outcome: Adequate for Discharge   Problem: Pain Managment: Goal: General experience of comfort will improve Outcome: Adequate for Discharge   Problem: Safety: Goal: Ability to remain free from injury will improve Outcome: Adequate for Discharge   Problem: Skin Integrity: Goal: Risk for impaired skin integrity will decrease Outcome: Adequate for Discharge   Problem: Education: Goal: Knowledge of condition and prescribed therapy will improve Outcome: Adequate for Discharge   Problem: Cardiac: Goal: Will achieve and/or maintain adequate cardiac output Outcome: Adequate for Discharge   Problem:  Physical Regulation: Goal: Complications related to the disease process, condition or treatment will be avoided or minimized Outcome: Adequate for Discharge   

## 2022-08-07 ENCOUNTER — Ambulatory Visit: Payer: Medicare PPO | Admitting: Podiatry

## 2022-08-08 ENCOUNTER — Encounter: Payer: Self-pay | Admitting: Internal Medicine

## 2022-08-11 DIAGNOSIS — R55 Syncope and collapse: Secondary | ICD-10-CM

## 2022-08-12 DIAGNOSIS — R55 Syncope and collapse: Secondary | ICD-10-CM | POA: Diagnosis not present

## 2022-08-13 ENCOUNTER — Telehealth (INDEPENDENT_AMBULATORY_CARE_PROVIDER_SITE_OTHER): Payer: Medicare PPO | Admitting: Nurse Practitioner

## 2022-08-13 ENCOUNTER — Encounter (HOSPITAL_BASED_OUTPATIENT_CLINIC_OR_DEPARTMENT_OTHER): Payer: Self-pay | Admitting: Nurse Practitioner

## 2022-08-13 DIAGNOSIS — R21 Rash and other nonspecific skin eruption: Secondary | ICD-10-CM | POA: Diagnosis not present

## 2022-08-13 DIAGNOSIS — R42 Dizziness and giddiness: Secondary | ICD-10-CM

## 2022-08-13 DIAGNOSIS — T7840XA Allergy, unspecified, initial encounter: Secondary | ICD-10-CM | POA: Diagnosis not present

## 2022-08-13 MED ORDER — TRIAMCINOLONE ACETONIDE 0.1 % EX CREA: 1.0000 | TOPICAL_CREAM | Freq: Two times a day (BID) | CUTANEOUS | 1 refills | Status: DC

## 2022-08-13 NOTE — Assessment & Plan Note (Signed)
Ongoing dizziness of unknown etiology.  At this time patient is wearing a Zio patch and has completed a echocardiogram.  A cardiac CT is pending.  Review of MRI with patient today.  We will plan to continue to monitor.  At this time etiology remains unclear.  Discussed with patient that this could be multifactorial with multiple etiologies.  No evidence of mastoiditis present on MRI.  Patient reassured of this.  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  Consider possibility that this could be contributing to dizziness.  We will continue to monitor.

## 2022-08-13 NOTE — Progress Notes (Signed)
Virtual Visit Encounter mychart visit.   I connected with  Pam [name] on 08/13/22 at  4:30 PM EDT by secure video and audio telemedicine application. I verified that I am speaking with the correct person using two identifiers.   I introduced myself as a Designer, jewellery with the practice. The limitations of evaluation and management by telemedicine discussed with the patient and the availability of in person appointments. The patient expressed verbal understanding and consent to proceed.  Participating parties in this visit include: Myself and patient  The patient is: Patient Location: Home I am: Provider Location: Office/Clinic Subjective:    CC and HPI: Pam [name] is a 72 y.o. year old female presenting for follow up of dizziness. Patient reports the following:  Dizziness: Dr. Loma Newton has concerns with her ongoing dizziness and the recent imaging and tests that have been performed.  She has questions about her MRI that was completed recently. She tells me that she read the results and used her research knowledge to look up the interpretation. She had concerns with the reading of trace fluid on the right mastoid tip. She expresses concern over mastoiditis.   She tells me her dizziness is still occurring and seems to be more prevalent when standing or turning quickly. She feels that the fluid on the mastoid may play a part in this and would like my input.   She also tells me that she broke out in a rash after the echocardiogram recently. The rash was under her left breast and was erythematous and pruritic. She tells me she did put apple cider vinegar on this and it has stopped itching and is normal color. She tells me that there is some scarring appearance present.   She also endorses a pruritic rash around the ZIO patch. She is concerned about a latex allergy causing this. She has no shortness of breath, CP, or swelling present.   Past medical history, Surgical history, Family  history not pertinant except as noted below, Social history, Allergies, and medications have been entered into the medical record, reviewed, and corrections made.   Review of Systems:  All review of systems negative except what is listed in the HPI  Objective:    Alert and oriented x 4 Speaking in clear sentences with no shortness of breath. No distress.  Impression and Recommendations:    Problem List Items Addressed This Visit     Dizziness, nonspecific    Ongoing dizziness of unknown etiology.  At this time patient is wearing a Zio patch and has completed a echocardiogram.  A cardiac CT is pending.  Review of MRI with patient today.  We will plan to continue to monitor.  At this time etiology remains unclear.  Discussed with patient that this could be multifactorial with multiple etiologies.  No evidence of mastoiditis present on MRI.  Patient reassured of this.  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  Consider possibility that this could be contributing to dizziness.  We will continue to monitor.      Rash due to allergy - Primary    Symptoms appear to be consistent with allergic response to adhesive from ZIO patch to the skin. Review of FDA report for ZIO shows no natural rubber latex present, therefore this would not be the causative factor. Will send triamcinolone cream to apply to rash for treatment.  Patient will follow-up if symptoms worsen or fail to improve.      Relevant Medications  triamcinolone cream (KENALOG) 0.1 %    orders and follow up as documented in EMR I discussed the assessment and treatment plan with the patient. The patient was provided an opportunity to ask questions and all were answered. The patient agreed with the plan and demonstrated an understanding of the instructions.   The patient was advised to call back or seek an in-person evaluation if the symptoms worsen or if the condition fails to improve as  anticipated.  Follow-Up: after labs and/or imaging, consults, etc. have been completed  I provided 38 minutes of non-face-to-face interaction with this non face-to-face encounter including intake, same-day documentation, and chart review.   Orma Render, NP , DNP, AGNP-c [place] at [facility] [phone] (fax)

## 2022-08-13 NOTE — Assessment & Plan Note (Addendum)
Symptoms appear to be consistent with allergic response to adhesive from ZIO patch to the skin. Review of FDA report for ZIO shows no natural rubber latex present, therefore this would not be the causative factor. Will send triamcinolone cream to apply to rash for treatment.  Patient will follow-up if symptoms worsen or fail to improve.

## 2022-08-21 ENCOUNTER — Encounter: Payer: Self-pay | Admitting: Podiatry

## 2022-08-21 ENCOUNTER — Ambulatory Visit: Payer: Medicare PPO | Admitting: Podiatry

## 2022-08-21 DIAGNOSIS — L84 Corns and callosities: Secondary | ICD-10-CM

## 2022-08-21 NOTE — Progress Notes (Signed)
Subjective:   Patient ID: Pam [name], female   DOB: 71 y.o.   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

## 2022-08-23 ENCOUNTER — Telehealth (HOSPITAL_COMMUNITY): Payer: Self-pay | Admitting: *Deleted

## 2022-08-23 NOTE — Telephone Encounter (Signed)
Attempted to call patient regarding upcoming cardiac CT appointment. °Left message on voicemail with name and callback number ° °Jathen Sudano RN Navigator Cardiac Imaging °[facility] °[phone] Office °[phone] Cell ° °

## 2022-08-26 ENCOUNTER — Other Ambulatory Visit (HOSPITAL_COMMUNITY): Payer: Self-pay | Admitting: Emergency Medicine

## 2022-08-26 ENCOUNTER — Ambulatory Visit (HOSPITAL_BASED_OUTPATIENT_CLINIC_OR_DEPARTMENT_OTHER)
Admission: RE | Admit: 2022-08-26 | Discharge: 2022-08-26 | Disposition: A | Discharge status: Home / Self Care | Payer: Medicare PPO | Source: Ambulatory Visit | Attending: Cardiology | Admitting: Cardiology

## 2022-08-26 ENCOUNTER — Ambulatory Visit (HOSPITAL_COMMUNITY)
Admission: RE | Admit: 2022-08-26 | Discharge: 2022-08-26 | Disposition: A | Discharge status: Home / Self Care | Payer: Medicare PPO | Source: Ambulatory Visit | Attending: Student | Admitting: Student

## 2022-08-26 ENCOUNTER — Ambulatory Visit (HOSPITAL_COMMUNITY)
Admission: RE | Admit: 2022-08-26 | Discharge: 2022-08-26 | Disposition: A | Discharge status: Home / Self Care | Payer: Medicare PPO | Source: Ambulatory Visit | Attending: Cardiology | Admitting: Cardiology

## 2022-08-26 DIAGNOSIS — R931 Abnormal findings on diagnostic imaging of heart and coronary circulation: Secondary | ICD-10-CM | POA: Diagnosis not present

## 2022-08-26 DIAGNOSIS — I2510 Atherosclerotic heart disease of native coronary artery without angina pectoris: Secondary | ICD-10-CM

## 2022-08-26 DIAGNOSIS — R072 Precordial pain: Secondary | ICD-10-CM | POA: Insufficient documentation

## 2022-08-26 MED ORDER — NITROGLYCERIN 0.4 MG SL SUBL
SUBLINGUAL_TABLET | SUBLINGUAL | Status: AC
  Filled 2022-08-26: qty 2

## 2022-08-26 MED ORDER — IOHEXOL 350 MG/ML SOLN
95.0000 mL | Freq: Once | INTRAVENOUS | Status: AC | PRN
  Administered 2022-08-26: 95 mL via INTRAVENOUS

## 2022-08-26 MED ORDER — NITROGLYCERIN 0.4 MG SL SUBL
0.8000 mg | SUBLINGUAL_TABLET | Freq: Once | SUBLINGUAL | Status: AC
  Administered 2022-08-26: 0.8 mg via SUBLINGUAL

## 2022-08-26 NOTE — Progress Notes (Signed)
Patient brought in prescription of zofran 4 mg as prescribed by ordering physician. Given water and taken at this time.

## 2022-09-03 ENCOUNTER — Ambulatory Visit (INDEPENDENT_AMBULATORY_CARE_PROVIDER_SITE_OTHER): Payer: Medicare PPO | Admitting: Nurse Practitioner

## 2022-09-03 ENCOUNTER — Encounter (HOSPITAL_BASED_OUTPATIENT_CLINIC_OR_DEPARTMENT_OTHER): Payer: Self-pay | Admitting: Nurse Practitioner

## 2022-09-03 VITALS — BP 101/68 | HR 90 | Ht 62.0 in | Wt 149.2 lb

## 2022-09-03 DIAGNOSIS — J0140 Acute pansinusitis, unspecified: Secondary | ICD-10-CM | POA: Insufficient documentation

## 2022-09-03 DIAGNOSIS — R059 Cough, unspecified: Secondary | ICD-10-CM

## 2022-09-03 LAB — POCT INFLUENZA A/B
Influenza A, POC: NEGATIVE
Influenza B, POC: NEGATIVE

## 2022-09-03 MED ORDER — AZITHROMYCIN 250 MG PO TABS: ORAL_TABLET | ORAL | 0 refills | Status: AC

## 2022-09-03 MED ORDER — BENZONATATE 200 MG PO CAPS: 200.0000 mg | ORAL_CAPSULE | Freq: Three times a day (TID) | ORAL | 1 refills | Status: DC | PRN

## 2022-09-03 NOTE — Progress Notes (Addendum)
  Orma Render, DNP, AGNP-c Primary Care & Sports Medicine [address]  [place], [place] [address] [phone]  Subjective:   Pam [name] is a 71 y.o. female presents to day for evaluation of: Acute Visit (Patient presents today with cough, chills and sweats, runny nose)  URI Dr. Clayton Bibles endorses onset of cough, chills, sweats, sinus pain and pressure, wheezing, sore throat, muscle pain, and increased fatigue that is an ongoing for the past several days.  She reports that she was exposed to her grandson who did have cold symptoms as well.  She tested negative for COVID at home. She has been taking minimal medications due to concerns with interaction with medication and chronic conditions.  PMH, Medications, and Allergies reviewed and updated in chart as appropriate.   ROS negative except for what is listed in HPI. Objective:  BP 101/68   Pulse 90   Ht '5\' 2"'$  (1.575 m)   Wt 149 lb 3.2 oz (67.7 kg)   SpO2 99%   BMI 27.29 kg/m  Physical Exam Vitals and nursing note reviewed.  Constitutional:      Appearance: She is ill-appearing.  HENT:     Head: Normocephalic.     Right Ear: Tenderness present. A middle ear effusion is present.     Left Ear: Tenderness present. A middle ear effusion is present.     Nose: Congestion and rhinorrhea present.     Right Sinus: Maxillary sinus tenderness and frontal sinus tenderness present.     Left Sinus: Maxillary sinus tenderness and frontal sinus tenderness present.     Mouth/Throat:     Mouth: Mucous membranes are moist.     Pharynx: Posterior oropharyngeal erythema present.  Eyes:     Extraocular Movements: Extraocular movements intact.     Pupils: Pupils are equal, round, and reactive to light.  Cardiovascular:     Rate and Rhythm: Normal rate and regular rhythm.     Pulses: Normal pulses.     Heart sounds: Normal heart sounds.  Pulmonary:     Effort: Pulmonary effort is normal.     Breath sounds: Wheezing  present.  Neurological:     Mental Status: She is alert.           Assessment & Plan:   Problem List Items Addressed This Visit     Acute non-recurrent pansinusitis - Primary    Symptoms and presentation consistent with acute sinusitis.  Given the length of time symptoms been present recommend antibiotic therapy.  Patient does have a history of asthma therefore close monitoring recommended and frequent use of albuterol inhaler as needed.  Encourage patient to utilize over-the-counter medications such as Mucinex and increase hydration to help with symptoms.  May also take Tylenol.  Recommend rest is much as possible.  If symptoms worsen or fail to improve recommend follow-up.      Relevant Medications   benzonatate (TESSALON) 200 MG capsule   Other Visit Diagnoses     Cough in adult       Relevant Medications   benzonatate (TESSALON) 200 MG capsule   Other Relevant Orders   POCT Influenza A/B (Completed)         Orma Render, DNP, AGNP-c 10/10/2022  7:07 PM    History, Medications, Surgery, SDOH, and Family History reviewed and updated as appropriate.

## 2022-09-06 DIAGNOSIS — R55 Syncope and collapse: Secondary | ICD-10-CM

## 2022-09-06 DIAGNOSIS — R072 Precordial pain: Secondary | ICD-10-CM

## 2022-09-06 MED ORDER — ROSUVASTATIN CALCIUM 20 MG PO TABS: 20.0000 mg | ORAL_TABLET | Freq: Every day | ORAL | 6 refills | Status: DC

## 2022-09-06 NOTE — Progress Notes (Signed)
Lab entered Crestor sent to [facility]

## 2022-09-10 NOTE — Assessment & Plan Note (Signed)
Symptoms and presentation consistent with acute sinusitis.  Given the length of time symptoms been present recommend antibiotic therapy.  Patient does have a history of asthma therefore close monitoring recommended and frequent use of albuterol inhaler as needed.  Encourage patient to utilize over-the-counter medications such as Mucinex and increase hydration to help with symptoms.  May also take Tylenol.  Recommend rest is much as possible.  If symptoms worsen or fail to improve recommend follow-up.

## 2022-09-12 ENCOUNTER — Ambulatory Visit (HOSPITAL_BASED_OUTPATIENT_CLINIC_OR_DEPARTMENT_OTHER): Payer: Medicare PPO | Admitting: Family

## 2022-09-12 ENCOUNTER — Encounter (HOSPITAL_BASED_OUTPATIENT_CLINIC_OR_DEPARTMENT_OTHER): Payer: Self-pay | Admitting: Family

## 2022-09-12 VITALS — BP 120/62 | HR 66 | Ht 62.0 in | Wt 140.8 lb

## 2022-09-12 DIAGNOSIS — R55 Syncope and collapse: Secondary | ICD-10-CM | POA: Diagnosis not present

## 2022-09-12 DIAGNOSIS — I25118 Atherosclerotic heart disease of native coronary artery with other forms of angina pectoris: Secondary | ICD-10-CM

## 2022-09-12 DIAGNOSIS — E785 Hyperlipidemia, unspecified: Secondary | ICD-10-CM | POA: Diagnosis not present

## 2022-09-12 DIAGNOSIS — I10 Essential (primary) hypertension: Secondary | ICD-10-CM | POA: Diagnosis not present

## 2022-09-12 MED ORDER — ROSUVASTATIN CALCIUM 10 MG PO TABS: 10.0000 mg | ORAL_TABLET | Freq: Every day | ORAL | 6 refills | Status: DC

## 2022-09-12 NOTE — Patient Instructions (Addendum)
Medication Instructions:  Change Crestor to 1/2 tab 3 times weekly  *If you need a refill on your cardiac medications before your next appointment, please call your pharmacy*   Lab Work: Please return for Lab work in 2 months for a CMET and Lipids.   You may come to the...   [facility] (3rd floor) [address] [place], [place] [address]  Open: 8am-Noon and 1pm-4:30pm  Please ring the doorbell on the small table when you exit the elevator and the Lab Tech will come get you  [place] at [facility] [address] [place], [place], [place] [address] Open: 8am-1pm, then 2pm-4:30pm   [place]- Please see attached locations sheet stapled to your lab work with address and hours.   If you have labs (blood work) drawn today and your tests are completely normal, you will receive your results only by: [place] (if you have MyChart) OR A paper copy in the mail If you have any lab test that is abnormal or we need to change your treatment, we will call you to review the results.   Testing/Procedures: None   Follow-Up: At [facility], you and your health needs are our priority.  As part of our continuing mission to provide you with exceptional heart care, we have created designated Provider Care Teams.  These Care Teams include your primary Cardiologist (physician) and Advanced Practice Providers (APPs -  Physician Assistants and Nurse Practitioners) who all work together to provide you with the care you need, when you need it.  We recommend signing up for the patient portal called "MyChart".  Sign up information is provided on this After Visit Summary.  MyChart is used to connect with patients for Virtual Visits (Telemedicine).  Patients are able to view lab/test results, encounter notes, upcoming appointments, etc.  Non-urgent messages can be sent to your provider as well.   To learn more about what you can do with MyChart, go to  [email or web address].    Your next appointment:   3 month(s)  The format for your next appointment:   In Person  Provider:   Skeet Latch, MD    Other Instructions  Referral to [facility] at [facility] they will reach out to get you started.    May purchase over the counter Meclizine as needed for vertigo or dizziness with sensation of spinning. ________________  Your heart monitor showed no dangerous abnormal heart rhythms which is good!  Your echocardiogram (ultrasound of your heart) in the hospital showed normal heart muscle function and no significant heart valve abnormalities that would cause dizziness.   You cardiac CTA showed plaque in the heart that was nonobstructive.   For coronary artery disease often called "heart disease" we aim for optimal guideline directed medical therapy. We use the "A, B, C"s to help keep [place] on track!  A = Aspirin '81mg'$  daily B = Blood pressure control.  C = Cholesterol control. You take Rosuvastatin to help control your cholesterol.  D = Don't forget nitroglycerin! This is an emergency tablet to be used if you have chest pain. E = Extras. In your case, this is Ozempic which helps to reduce your cardiovascular risk.
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
## 2022-09-13 ENCOUNTER — Telehealth: Payer: Self-pay

## 2022-09-13 NOTE — Telephone Encounter (Signed)
VMT pt requesting call back to discuss referral to [facility] 

## 2022-09-14 ENCOUNTER — Encounter (HOSPITAL_BASED_OUTPATIENT_CLINIC_OR_DEPARTMENT_OTHER): Payer: Self-pay | Admitting: Family

## 2022-09-17 ENCOUNTER — Telehealth: Payer: Self-pay

## 2022-09-17 NOTE — Telephone Encounter (Signed)
Pt returned call reference [facility]  Explained program and offered next class unfortunately has a conflict with class time Can do am class. Will call her with the start of the next 1030am class likely Jan.

## 2022-09-24 ENCOUNTER — Ambulatory Visit (HOSPITAL_BASED_OUTPATIENT_CLINIC_OR_DEPARTMENT_OTHER): Payer: Medicare PPO | Admitting: Nurse Practitioner

## 2022-09-24 ENCOUNTER — Other Ambulatory Visit: Payer: Self-pay | Admitting: *Deleted

## 2022-09-24 MED ORDER — BUDESONIDE 0.5 MG/2ML IN SUSP: 0.5000 mg | Freq: Two times a day (BID) | RESPIRATORY_TRACT | 1 refills | Status: AC

## 2022-09-27 ENCOUNTER — Ambulatory Visit (HOSPITAL_BASED_OUTPATIENT_CLINIC_OR_DEPARTMENT_OTHER): Payer: Medicare PPO | Admitting: Nurse Practitioner

## 2022-09-30 ENCOUNTER — Other Ambulatory Visit (HOSPITAL_BASED_OUTPATIENT_CLINIC_OR_DEPARTMENT_OTHER): Payer: Self-pay | Admitting: Nurse Practitioner

## 2022-09-30 DIAGNOSIS — J312 Chronic pharyngitis: Secondary | ICD-10-CM

## 2022-10-01 ENCOUNTER — Encounter (HOSPITAL_BASED_OUTPATIENT_CLINIC_OR_DEPARTMENT_OTHER): Payer: Self-pay

## 2022-10-01 DIAGNOSIS — E785 Hyperlipidemia, unspecified: Secondary | ICD-10-CM

## 2022-10-01 DIAGNOSIS — I25118 Atherosclerotic heart disease of native coronary artery with other forms of angina pectoris: Secondary | ICD-10-CM

## 2022-10-01 MED ORDER — REPATHA SURECLICK 140 MG/ML ~~LOC~~ SOAJ: 140.0000 mg | SUBCUTANEOUS | 6 refills | Status: DC

## 2022-10-01 MED ORDER — ROSUVASTATIN CALCIUM 10 MG PO TABS: ORAL_TABLET | ORAL | 6 refills | Status: DC

## 2022-10-01 NOTE — Telephone Encounter (Signed)
Per cover my meds,   "Message from Plan PA Case: [id number], Status: Approved, Coverage Starts on: 11/11/2021 12:00:00 AM, Coverage Ends on: 11/11/2023 12:00:00 AM. Questions? Contact [phone]."   Patient notified

## 2022-10-01 NOTE — Telephone Encounter (Signed)
Repatha prior auth started via cover my meds;   "Wait for Determination Please wait for [facility] NCPDP 2017 to return a determination."

## 2022-10-02 NOTE — Telephone Encounter (Signed)
I do not see pt is still on this medication

## 2022-10-07 ENCOUNTER — Ambulatory Visit (HOSPITAL_BASED_OUTPATIENT_CLINIC_OR_DEPARTMENT_OTHER): Payer: Medicare PPO | Admitting: Nurse Practitioner

## 2022-10-14 ENCOUNTER — Encounter (HOSPITAL_BASED_OUTPATIENT_CLINIC_OR_DEPARTMENT_OTHER): Payer: Self-pay

## 2022-10-15 ENCOUNTER — Emergency Department (HOSPITAL_COMMUNITY): Payer: Medicare PPO

## 2022-10-15 ENCOUNTER — Emergency Department (HOSPITAL_COMMUNITY)
Admission: EM | Admit: 2022-10-15 | Discharge: 2022-10-15 | Disposition: A | Discharge status: Home / Self Care | Payer: Medicare PPO | Attending: Emergency Medicine | Admitting: Emergency Medicine

## 2022-10-15 ENCOUNTER — Other Ambulatory Visit: Payer: Self-pay

## 2022-10-15 ENCOUNTER — Encounter (HOSPITAL_COMMUNITY): Payer: Self-pay

## 2022-10-15 DIAGNOSIS — J449 Chronic obstructive pulmonary disease, unspecified: Secondary | ICD-10-CM | POA: Insufficient documentation

## 2022-10-15 DIAGNOSIS — I10 Essential (primary) hypertension: Secondary | ICD-10-CM | POA: Diagnosis not present

## 2022-10-15 DIAGNOSIS — Z1152 Encounter for screening for COVID-19: Secondary | ICD-10-CM | POA: Diagnosis not present

## 2022-10-15 DIAGNOSIS — I2510 Atherosclerotic heart disease of native coronary artery without angina pectoris: Secondary | ICD-10-CM | POA: Insufficient documentation

## 2022-10-15 DIAGNOSIS — Z7951 Long term (current) use of inhaled steroids: Secondary | ICD-10-CM | POA: Insufficient documentation

## 2022-10-15 DIAGNOSIS — R0602 Shortness of breath: Secondary | ICD-10-CM | POA: Diagnosis not present

## 2022-10-15 DIAGNOSIS — Z91040 Latex allergy status: Secondary | ICD-10-CM | POA: Diagnosis not present

## 2022-10-15 DIAGNOSIS — Z79899 Other long term (current) drug therapy: Secondary | ICD-10-CM | POA: Diagnosis not present

## 2022-10-15 DIAGNOSIS — Z87891 Personal history of nicotine dependence: Secondary | ICD-10-CM | POA: Diagnosis not present

## 2022-10-15 DIAGNOSIS — R079 Chest pain, unspecified: Secondary | ICD-10-CM | POA: Insufficient documentation

## 2022-10-15 DIAGNOSIS — Z7982 Long term (current) use of aspirin: Secondary | ICD-10-CM | POA: Diagnosis not present

## 2022-10-15 DIAGNOSIS — E119 Type 2 diabetes mellitus without complications: Secondary | ICD-10-CM | POA: Diagnosis not present

## 2022-10-15 DIAGNOSIS — R200 Anesthesia of skin: Secondary | ICD-10-CM | POA: Insufficient documentation

## 2022-10-15 DIAGNOSIS — R0789 Other chest pain: Secondary | ICD-10-CM | POA: Diagnosis not present

## 2022-10-15 LAB — CBC
HCT: 39.1 % (ref 36.0–46.0)
Hemoglobin: 13.1 g/dL (ref 12.0–15.0)
MCH: 26.4 pg (ref 26.0–34.0)
MCHC: 33.5 g/dL (ref 30.0–36.0)
MCV: 78.7 fL — ABNORMAL LOW (ref 80.0–100.0)
Platelets: 282 10*3/uL (ref 150–400)
RBC: 4.97 MIL/uL (ref 3.87–5.11)
RDW: 13.2 % (ref 11.5–15.5)
WBC: 4.5 10*3/uL (ref 4.0–10.5)
nRBC: 0 % (ref 0.0–0.2)

## 2022-10-15 LAB — TROPONIN I (HIGH SENSITIVITY)
Troponin I (High Sensitivity): 3 ng/L (ref <18)
Troponin I (High Sensitivity): 4 ng/L (ref <18)

## 2022-10-15 LAB — RESP PANEL BY RT-PCR (FLU A&B, COVID) ARPGX2
Influenza A by PCR: NEGATIVE
Influenza B by PCR: NEGATIVE
SARS Coronavirus 2 by RT PCR: NEGATIVE

## 2022-10-15 LAB — BASIC METABOLIC PANEL
Anion gap: 7 (ref 5–15)
BUN: 10 mg/dL (ref 8–23)
CO2: 26 mmol/L (ref 22–32)
Calcium: 9.3 mg/dL (ref 8.9–10.3)
Chloride: 106 mmol/L (ref 98–111)
Creatinine, Ser: 0.69 mg/dL (ref 0.44–1.00)
GFR, Estimated: >60 mL/min (ref >60)
Glucose, Bld: 106 mg/dL — ABNORMAL HIGH (ref 70–99)
Potassium: 3.8 mmol/L (ref 3.5–5.1)
Sodium: 139 mmol/L (ref 135–145)

## 2022-10-15 LAB — D-DIMER, QUANTITATIVE: D-Dimer, Quant: 0.27 ug/mL-FEU (ref 0.00–0.50)

## 2022-10-15 NOTE — Discharge Instructions (Addendum)
Today you were seen in the emergency department for your chest pain.  You had an EKG, troponin, and D-dimer that were reassuring.  Please follow-up with your cardiologist as soon as possible about your symptoms.  Follow-up with your primary doctor in 2 to 3 days.  Return if you experience any of the following: Worsening pain, difficulty breathing, unexplained sweating, or any other concerning symptoms.
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
## 2022-10-15 NOTE — ED Triage Notes (Signed)
Patient developed chest pain this am, reports that it radiates to neck and left arm. Took 1 asa prior to arrival. Nausea for the last hour. Also reports respiratory illness for past few weeks

## 2022-10-15 NOTE — ED Provider Triage Note (Addendum)
Emergency Medicine Provider Triage Evaluation Note  Pam [name] , a 71 y.o. female  was evaluated in triage.  Pt complains of Chest pain starting around noon today. Central chest radiating to left shoulder and down left arm. Pain now feels like it is going across chest. Describes pain as similar to a migraine. Symptoms worse now. Associated headache.   HX OF costrocrondritis  Review of Systems  Positive:  Negative:   Physical Exam  BP 134/71 (BP Location: Left Arm)   Pulse 64   Temp 97.8 F (36.6 C) (Oral)   Resp 16   SpO2 99%  Gen:   Awake, no distress   Resp:  Normal effort  MSK:   Moves extremities without difficulty  Other:  Pain reproducible  Medical Decision Making  Medically screening exam initiated at 3:27 PM.  Appropriate orders placed.  Ameliya Nicotra was informed that the remainder of the evaluation will be completed by another provider, this initial triage assessment does not replace that evaluation, and the importance of remaining in the ED until their evaluation is complete.     Adolphus Birchwood, PA-C 10/15/22 1529    Adolphus Birchwood, PA-C 10/15/22 1531

## 2022-10-15 NOTE — ED Notes (Signed)
Patient verbalizes understanding of discharge instructions. Opportunity for questioning and answers were provided. Armband removed by staff, pt discharged from ED ambulatory.   

## 2022-10-17 ENCOUNTER — Other Ambulatory Visit (HOSPITAL_BASED_OUTPATIENT_CLINIC_OR_DEPARTMENT_OTHER): Payer: Self-pay | Admitting: Nurse Practitioner

## 2022-10-17 DIAGNOSIS — E118 Type 2 diabetes mellitus with unspecified complications: Secondary | ICD-10-CM

## 2022-10-18 NOTE — Telephone Encounter (Signed)
Please advise 

## 2022-10-22 ENCOUNTER — Encounter (HOSPITAL_BASED_OUTPATIENT_CLINIC_OR_DEPARTMENT_OTHER): Payer: Self-pay | Admitting: Emergency Medicine

## 2022-10-22 ENCOUNTER — Ambulatory Visit (INDEPENDENT_AMBULATORY_CARE_PROVIDER_SITE_OTHER): Payer: Medicare PPO

## 2022-10-22 ENCOUNTER — Ambulatory Visit
Admission: RE | Admit: 2022-10-22 | Discharge: 2022-10-22 | Disposition: A | Discharge status: Home / Self Care | Payer: Medicare PPO | Source: Ambulatory Visit | Attending: Emergency Medicine | Admitting: Emergency Medicine

## 2022-10-22 ENCOUNTER — Emergency Department (HOSPITAL_BASED_OUTPATIENT_CLINIC_OR_DEPARTMENT_OTHER)
Admission: EM | Admit: 2022-10-22 | Discharge: 2022-10-22 | Disposition: A | Discharge status: Home / Self Care | Payer: Medicare PPO | Attending: Emergency Medicine | Admitting: Emergency Medicine

## 2022-10-22 ENCOUNTER — Emergency Department (HOSPITAL_BASED_OUTPATIENT_CLINIC_OR_DEPARTMENT_OTHER): Payer: Medicare PPO

## 2022-10-22 ENCOUNTER — Other Ambulatory Visit: Payer: Self-pay

## 2022-10-22 VITALS — BP 165/87 | HR 86 | Temp 100.4°F | Resp 16

## 2022-10-22 DIAGNOSIS — Z8719 Personal history of other diseases of the digestive system: Secondary | ICD-10-CM

## 2022-10-22 DIAGNOSIS — M47816 Spondylosis without myelopathy or radiculopathy, lumbar region: Secondary | ICD-10-CM | POA: Diagnosis not present

## 2022-10-22 DIAGNOSIS — R1031 Right lower quadrant pain: Secondary | ICD-10-CM | POA: Diagnosis not present

## 2022-10-22 DIAGNOSIS — R1011 Right upper quadrant pain: Secondary | ICD-10-CM

## 2022-10-22 DIAGNOSIS — R059 Cough, unspecified: Secondary | ICD-10-CM | POA: Diagnosis not present

## 2022-10-22 DIAGNOSIS — Z9049 Acquired absence of other specified parts of digestive tract: Secondary | ICD-10-CM

## 2022-10-22 DIAGNOSIS — R109 Unspecified abdominal pain: Secondary | ICD-10-CM | POA: Diagnosis not present

## 2022-10-22 LAB — POCT URINALYSIS DIP (MANUAL ENTRY)
Bilirubin, UA: NEGATIVE
Glucose, UA: NEGATIVE mg/dL
Ketones, POC UA: NEGATIVE mg/dL
Leukocytes, UA: NEGATIVE
Nitrite, UA: NEGATIVE
Protein Ur, POC: NEGATIVE mg/dL
Spec Grav, UA: 1.015 (ref 1.010–1.025)
Urobilinogen, UA: 0.2 E.U./dL
pH, UA: 6 (ref 5.0–8.0)

## 2022-10-22 LAB — BASIC METABOLIC PANEL
Anion gap: 11 (ref 5–15)
BUN: 13 mg/dL (ref 8–23)
CO2: 23 mmol/L (ref 22–32)
Calcium: 9.7 mg/dL (ref 8.9–10.3)
Chloride: 105 mmol/L (ref 98–111)
Creatinine, Ser: 0.56 mg/dL (ref 0.44–1.00)
GFR, Estimated: >60 mL/min (ref >60)
Glucose, Bld: 92 mg/dL (ref 70–99)
Potassium: 3.6 mmol/L (ref 3.5–5.1)
Sodium: 139 mmol/L (ref 135–145)

## 2022-10-22 LAB — CBC
HCT: 39.8 % (ref 36.0–46.0)
Hemoglobin: 13.1 g/dL (ref 12.0–15.0)
MCH: 25.9 pg — ABNORMAL LOW (ref 26.0–34.0)
MCHC: 32.9 g/dL (ref 30.0–36.0)
MCV: 78.8 fL — ABNORMAL LOW (ref 80.0–100.0)
Platelets: 297 10*3/uL (ref 150–400)
RBC: 5.05 MIL/uL (ref 3.87–5.11)
RDW: 13.4 % (ref 11.5–15.5)
WBC: 4.8 10*3/uL (ref 4.0–10.5)
nRBC: 0 % (ref 0.0–0.2)

## 2022-10-22 MED ORDER — ONDANSETRON 4 MG PO TBDP: 4.0000 mg | ORAL_TABLET | Freq: Once | ORAL | Status: DC

## 2022-10-22 MED ORDER — KETOROLAC TROMETHAMINE 15 MG/ML IJ SOLN
15.0000 mg | Freq: Once | INTRAMUSCULAR | Status: AC
  Administered 2022-10-22: 15 mg via INTRAVENOUS
  Filled 2022-10-22: qty 1

## 2022-10-22 MED ORDER — IOHEXOL 300 MG/ML  SOLN
80.0000 mL | Freq: Once | INTRAMUSCULAR | Status: AC | PRN
  Administered 2022-10-22: 80 mL via INTRAVENOUS

## 2022-10-22 MED ORDER — LACTATED RINGERS IV BOLUS
1000.0000 mL | Freq: Once | INTRAVENOUS | Status: AC
  Administered 2022-10-22: 1000 mL via INTRAVENOUS

## 2022-10-22 MED ORDER — ONDANSETRON HCL 4 MG/2ML IJ SOLN
4.0000 mg | Freq: Once | INTRAMUSCULAR | Status: AC
  Administered 2022-10-22: 4 mg via INTRAVENOUS

## 2022-10-22 MED ORDER — DIPHENHYDRAMINE HCL 50 MG/ML IJ SOLN
25.0000 mg | Freq: Once | INTRAMUSCULAR | Status: AC
  Administered 2022-10-22: 25 mg via INTRAVENOUS
  Filled 2022-10-22: qty 1

## 2022-10-22 MED ORDER — ONDANSETRON HCL 4 MG/2ML IJ SOLN: 4.0000 mg | Freq: Once | INTRAMUSCULAR | Status: DC

## 2022-10-22 MED ORDER — ACETAMINOPHEN 500 MG PO TABS
1000.0000 mg | ORAL_TABLET | Freq: Once | ORAL | Status: AC
  Administered 2022-10-22: 1000 mg via ORAL
  Filled 2022-10-22: qty 2

## 2022-10-22 MED ORDER — GABAPENTIN 100 MG PO CAPS: 100.0000 mg | ORAL_CAPSULE | Freq: Three times a day (TID) | ORAL | 0 refills | Status: DC

## 2022-10-22 MED ORDER — KETOROLAC TROMETHAMINE 15 MG/ML IJ SOLN: 15.0000 mg | Freq: Once | INTRAMUSCULAR | Status: DC

## 2022-10-22 NOTE — ED Notes (Signed)
Ambulates to restroom with no assistance. Returns to room without incident.

## 2022-10-22 NOTE — ED Notes (Signed)
DC papers reviewed. No questions or concerns. No signs of distress. Pt assisted to wheelchair and out to lobby. Appropriate measures for safety taken. 

## 2022-10-22 NOTE — ED Triage Notes (Signed)
Pt states she has had a cough x5 weeks.  Pt states she has been having bloody sputum turned brown turned yellow, now clear x 3 days.   Pt c/o right lower back pain that radiates to right lower abd x3 days (feels like something is grabbing her side).  Today the pt c/o nausea (after eating) and right sided headache, and dizzy (when turning to fast).

## 2022-10-22 NOTE — Discharge Instructions (Signed)
Your urinalysis today is not concerning for bacterial infection or acute kidney stone.  Still recommended to go to the emergency department for further evaluation of your acute right-sided flank pain, low-grade fever, nausea and headache.
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
## 2022-10-22 NOTE — ED Provider Notes (Signed)
[facility] EMERGENCY DEPT Provider Note   CSN: [id number] Arrival date & time: 10/22/22  1652     History Chief Complaint  Patient presents with   Flank Pain    HPI Pam [name] is a 71 y.o. female presenting for chief complaint of right-sided flank pain.  She endorses a 21-monthhistory of progressive flank pain with acute worsening over the past few days.  She denies fevers or chills, nausea vomiting, single shortness of breath.  She is otherwise ambulatory tolerating p.o. intake.  Symptoms grossly improved here after administration of anti-inflammatories.  Endorses a significant history of fibromyalgia and feels that this is likely related to that when she did have an episode of nausea earlier today (now resolved) she was worried there might be something more acute going on.  History cholecystectomy and appendectomy.   Patient's recorded medical, surgical, social, medication list and allergies were reviewed in the Snapshot window as part of the initial history.   Review of Systems   Review of Systems  Constitutional:  Negative for chills and fever.  HENT:  Negative for ear pain and sore throat.   Eyes:  Negative for pain and visual disturbance.  Respiratory:  Negative for cough and shortness of breath.   Cardiovascular:  Negative for chest pain and palpitations.  Gastrointestinal:  Positive for abdominal pain and nausea. Negative for diarrhea and vomiting.  Genitourinary:  Negative for dysuria and hematuria.  Musculoskeletal:  Negative for arthralgias and back pain.  Skin:  Negative for color change and rash.  Neurological:  Negative for seizures and syncope.  All other systems reviewed and are negative.   Physical Exam Updated Vital Signs BP 138/76   Pulse 73   Temp 98.8 F (37.1 C) (Oral)   Resp 18   Ht '5\' 2"'$  (1.575 m)   Wt 63.9 kg   SpO2 97%   BMI 25.77 kg/m  Physical Exam Vitals and nursing note reviewed.  Constitutional:      General: She is  not in acute distress.    Appearance: She is well-developed.  HENT:     Head: Normocephalic and atraumatic.  Eyes:     Conjunctiva/sclera: Conjunctivae normal.  Cardiovascular:     Rate and Rhythm: Normal rate and regular rhythm.     Heart sounds: No murmur heard. Pulmonary:     Effort: Pulmonary effort is normal. No respiratory distress.     Breath sounds: Normal breath sounds.  Abdominal:     Palpations: Abdomen is soft.     Tenderness: There is abdominal tenderness. There is no guarding.  Musculoskeletal:        General: No swelling.     Cervical back: Neck supple.  Skin:    General: Skin is warm and dry.     Capillary Refill: Capillary refill takes less than 2 seconds.  Neurological:     Mental Status: She is alert.  Psychiatric:        Mood and Affect: Mood normal.      ED Course/ Medical Decision Making/ A&P Clinical Course as of 10/22/22 2112  Tue Oct 22, 2022  2010 CT/Labs OK [CC]    Clinical Course User Index [CC] CTretha Sciara MD    Procedures Procedures   Medications Ordered in ED Medications  acetaminophen (TYLENOL) tablet 1,000 mg (1,000 mg Oral Given 10/22/22 2005)  lactated ringers bolus 1,000 mL (1,000 mLs Intravenous New Bag/Given 10/22/22 2005)  ketorolac (TORADOL) 15 MG/ML injection 15 mg (15 mg Intravenous Given 10/22/22 1933)  ondansetron [facility]) injection 4 mg (4 mg Intravenous Given 10/22/22 1931)  diphenhydrAMINE (BENADRYL) injection 25 mg (25 mg Intravenous Given 10/22/22 2002)  iohexol (OMNIPAQUE) 300 MG/ML solution 80 mL (80 mLs Intravenous Contrast Given 10/22/22 1940)   Medical Decision Making:   Pam [name] is a 71 y.o. female who presented to the ED today with abdominal pain, detailed above.    Patient's presentation is complicated by their history of comorbid medical conditions.  Patient placed on continuous vitals and telemetry monitoring while in ED which was reviewed periodically.  Complete initial physical exam  performed, notably the patient  was hemodynamically stable in no acute distress.     Reviewed and confirmed nursing documentation for past medical history, family history, social history.    Initial Assessment:   With the patient's presentation of abdominal pain, most likely diagnosis is nonspecific etiology. Other diagnoses were considered including (but not limited to) gastroenteritis, colitis, small bowel obstruction, appendicitis, cholecystitis, pancreatitis, nephrolithiasis, UTI, pyleonephritis. These are considered less likely due to history of present illness and physical exam findings.   This is most consistent with an acute life/limb threatening illness complicated by underlying chronic conditions.   Initial Plan:  CBC/CMP to evaluate for underlying infectious/metabolic etiology for patient's abdominal pain  Lipase to evaluate for pancreatitis  CTAB/Pelvis with contrast to evaluate for structural/surgical etiology of patients' severe abdominal pain.  Urinalysis and repeat physical assessment to evaluate for UTI/Pyelonpehritis (patient declined to provide the samples today stating that she did not feel it was a UTI to triage) Empiric management of symptoms with escalating pain control and antiemetics as needed.   Initial Study Results:   Laboratory  All laboratory results reviewed without evidence of clinically relevant pathology.      Radiology All images reviewed independently. Agree with radiology report at this time.   CT ABDOMEN PELVIS W CONTRAST  Result Date: 10/22/2022 CLINICAL DATA:  Right flank pain for 3 days. Prior episode of bloody stools several days ago. EXAM: CT ABDOMEN AND PELVIS WITH CONTRAST TECHNIQUE: Multidetector CT imaging of the abdomen and pelvis was performed using the standard protocol following bolus administration of intravenous contrast. RADIATION DOSE REDUCTION: This exam was performed according to the departmental dose-optimization program which  includes automated exposure control, adjustment of the mA and/or kV according to patient size and/or use of iterative reconstruction technique. CONTRAST:  50m OMNIPAQUE IOHEXOL 300 MG/ML  SOLN COMPARISON:  04/30/2010 FINDINGS: Lower chest: Airway thickening is present, suggesting bronchitis or reactive airways disease. Mild chronic nodularity in the left inferior breast is not substantially changed from 2011 hence considered benign. Hepatobiliary: Cholecystectomy. No biliary dilatation. No significant focal hepatic lesion. Pancreas: Unremarkable Spleen: Unremarkable Adrenals/Urinary Tract: Unremarkable Stomach/Bowel: There are few scattered sigmoid colon diverticula. No findings of diverticulitis. Appendix surgically absent. No dilated bowel. Vascular/Lymphatic: Atheromatous plaque in the abdominal aorta along the dorsal origin of the celiac artery without sub Stansel narrowing of the celiac artery. Likewise the SMA appears widely patent. No pathologic adenopathy. Reproductive: Calcified posterior uterine fibroid. The neck unremarkable. Other: No supplemental non-categorized findings. Musculoskeletal: Mild grade 1 degenerative retrolisthesis of the T12-L1 level. Mild superior endplate cavity at L2 compatible with an interval superior endplate compression fracture since 2011, but not thought to be acute. Multilevel substantial facet arthropathy with bridging spurring along the facet joints. Multilevel left foraminal narrowing due to facet arthropathy in the lower thoracic and lumbar spine. IMPRESSION: 1. A specific cause for the patient's right flank pain is not identified. 2. Airway  thickening is present, suggesting bronchitis or reactive airways disease. 3. Mild atherosclerosis in the abdominal aorta. 4. Calcified posterior uterine fibroid. 5. Mild interval superior endplate compression fracture at L2 since 2011, but not thought to be acute. 6. Multilevel left foraminal narrowing due to facet arthropathy in the  lower thoracic and lumbar spine. Aortic Atherosclerosis (ICD10-I70.0). Electronically Signed   By: Van Clines M.D.   On: 10/22/2022 20:00   DG Abd Acute W/Chest  Result Date: 10/22/2022 CLINICAL DATA:  Productive cough for 5 weeks, hemoptysis, right lower back pain radiating to abdomen EXAM: DG ABDOMEN ACUTE WITH 1 VIEW CHEST COMPARISON:  10/15/2022 FINDINGS: Supine and upright frontal views of the abdomen and pelvis as well as an upright frontal view of the chest are obtained on 3 images. Cardiac silhouette is unremarkable. No airspace disease, effusion, or pneumothorax. No acute bony abnormality. No bowel obstruction or ileus. There are no masses. Calcifications left hemipelvis most consistent with degenerating fibroids. No free gas in the greater peritoneal sac. Multilevel lumbar spondylosis. IMPRESSION: 1. No acute intrathoracic process. 2. Unremarkable bowel gas pattern. Electronically Signed   By: Randa Ngo M.D.   On: 10/22/2022 15:26   DG Chest 1 View  Result Date: 10/15/2022 CLINICAL DATA:  Chest pain. Left-sided pain earlier, now central pain. EXAM: CHEST  1 VIEW COMPARISON:  Radiograph 08/05/2022 FINDINGS: The cardiomediastinal contours are normal. The lungs are clear. Pulmonary vasculature is normal. No consolidation, pleural effusion, or pneumothorax. No acute osseous abnormalities are seen. IMPRESSION: No acute chest findings. Electronically Signed   By: Keith Rake M.D.   On: 10/15/2022 16:02    Final Reassessment and Plan:   Patient's history of present on his physicals and findings are grossly reassuring today.  Objective findings grossly reassuring.  Symptoms improved after administration of anti-inflammatories.  On reassessment, patient is overall improved.  Given these findings, reassuring objective evaluation, patient stable for outpatient care management.  Prescribed patient's home medications for further care and management the outpatient setting. Disposition:  I  have considered need for hospitalization, however, considering all of the above, I believe this patient is stable for discharge at this time.  Patient/family educated about specific return precautions for given chief complaint and symptoms.  Patient/family educated about follow-up with PCP.     Patient/family expressed understanding of return precautions and need for follow-up. Patient spoken to regarding all imaging and laboratory results and appropriate follow up for these results. All education provided in verbal form with additional information in written form. Time was allowed for answering of patient questions. Patient discharged.    Emergency Department Medication Summary:   Medications  acetaminophen (TYLENOL) tablet 1,000 mg (1,000 mg Oral Given 10/22/22 2005)  lactated ringers bolus 1,000 mL (1,000 mLs Intravenous New Bag/Given 10/22/22 2005)  ketorolac (TORADOL) 15 MG/ML injection 15 mg (15 mg Intravenous Given 10/22/22 1933)  ondansetron (ZOFRAN) injection 4 mg (4 mg Intravenous Given 10/22/22 1931)  diphenhydrAMINE (BENADRYL) injection 25 mg (25 mg Intravenous Given 10/22/22 2002)  iohexol (OMNIPAQUE) 300 MG/ML solution 80 mL (80 mLs Intravenous Contrast Given 10/22/22 1940)            Clinical Impression:  1. Flank pain      Data Unavailable   Final Clinical Impression(s) / ED Diagnoses Final diagnoses:  Flank pain    Rx / DC Orders ED Discharge Orders          Ordered    gabapentin (NEURONTIN) 100 MG capsule  3 times daily  10/22/22 2049              Tretha Sciara, MD 10/22/22 2112

## 2022-10-22 NOTE — ED Notes (Signed)
Out to CT 

## 2022-10-22 NOTE — ED Notes (Signed)
Patient is being discharged from the Urgent Care and sent to the Emergency Department via POV . Per L. Morgan-Scales PA-C , patient is in need of higher level of care due to need for further evaluation . Patient is aware and verbalizes understanding of plan of care.  Vitals:   10/22/22 1420 10/22/22 1453  BP: 136/76 (!) 165/87  Pulse: 93 86  Resp: 17 16  Temp: 99.3 F (37.4 C) (!) 100.4 F (38 C)  SpO2: 95% 95%

## 2022-10-22 NOTE — ED Triage Notes (Signed)
Pt from UC with right flank pain x 3 days. Pt reports that the pain radiates to the rlq abdomen. Pt reports that she recently had respiratory symptoms that have resolved and that she had bloody stool several days ago but this has resolved. Pt denies any hematuria; states she has felt dehydrated and she is urinating somewhat normally (she urinates more when she drinks more). Pt alert  & oriented, nad noted.

## 2022-10-25 ENCOUNTER — Ambulatory Visit: Payer: Medicare PPO | Admitting: Nurse Practitioner

## 2022-10-25 ENCOUNTER — Encounter: Payer: Self-pay | Admitting: Nurse Practitioner

## 2022-10-25 VITALS — BP 118/60 | HR 67 | Ht 61.0 in | Wt 155.0 lb

## 2022-10-25 DIAGNOSIS — M5137 Other intervertebral disc degeneration, lumbosacral region: Secondary | ICD-10-CM | POA: Diagnosis not present

## 2022-10-25 DIAGNOSIS — E162 Hypoglycemia, unspecified: Secondary | ICD-10-CM | POA: Diagnosis not present

## 2022-10-25 DIAGNOSIS — M51379 Other intervertebral disc degeneration, lumbosacral region without mention of lumbar back pain or lower extremity pain: Secondary | ICD-10-CM

## 2022-10-25 DIAGNOSIS — R21 Rash and other nonspecific skin eruption: Secondary | ICD-10-CM | POA: Diagnosis not present

## 2022-10-25 DIAGNOSIS — M3320 Polymyositis, organ involvement unspecified: Secondary | ICD-10-CM | POA: Diagnosis not present

## 2022-10-25 DIAGNOSIS — E1149 Type 2 diabetes mellitus with other diabetic neurological complication: Secondary | ICD-10-CM | POA: Diagnosis not present

## 2022-10-25 DIAGNOSIS — M797 Fibromyalgia: Secondary | ICD-10-CM | POA: Diagnosis not present

## 2022-10-25 MED ORDER — SEMAGLUTIDE (1 MG/DOSE) 4 MG/3ML ~~LOC~~ SOPN: 1.0000 mg | PEN_INJECTOR | SUBCUTANEOUS | 3 refills | Status: DC

## 2022-10-25 MED ORDER — KETOCONAZOLE 2 % EX CREA: 1.0000 | TOPICAL_CREAM | Freq: Two times a day (BID) | CUTANEOUS | 3 refills | Status: DC

## 2022-10-25 MED ORDER — FREESTYLE LIBRE 2 SENSOR MISC: 12 refills | Status: DC

## 2022-10-25 NOTE — Progress Notes (Signed)
Worthy Keeler, DNP, AGNP-c [facility]  [address] [place], [place] [address] [phone]  ESTABLISHED PATIENT- Chronic Health and/or Follow-Up Visit  Blood pressure 118/60, pulse 67, height '5\' 1"'$  (1.549 m), weight 155 lb (70.3 kg), SpO2 98 %.    Pam [name] is a 71 y.o. year old female presenting today for evaluation and management of the following: ED Follow-up She tells me her "fibro is back with a vengeance" She reports that the pain is across her chest, down her left arm, in her lumbar region, and in her legs bilaterally.  Restarted gabapentin at '100mg'$  three times a week. She will plan to go up by '100mg'$  in 7 days. She can increase by '100mg'$  every 7 days until she reached '600mg'$  three times a day.   Candida under Breasts Erythematous rash under the breasts that is irritated by her bra, heat, sweating, etc. She does not feel this is getting better.   Freestyle She has been using the [employer] 2 for monitoring of her blood sugar.  She has had several instances of hypoglycemia in the night and she has awoken with sweating.  Taking Repatha now.   All ROS negative with exception of what is listed above.   PHYSICAL EXAM Physical Exam Vitals and nursing note reviewed.  Constitutional:      General: She is not in acute distress.    Appearance: Normal appearance.  HENT:     Head: Normocephalic.  Eyes:     Extraocular Movements: Extraocular movements intact.     Conjunctiva/sclera: Conjunctivae normal.     Pupils: Pupils are equal, round, and reactive to light.  Neck:     Vascular: No carotid bruit.  Cardiovascular:     Rate and Rhythm: Normal rate and regular rhythm.     Pulses: Normal pulses.     Heart sounds: Normal heart sounds. No murmur heard. Pulmonary:     Effort: Pulmonary effort is normal.     Breath sounds: Normal breath sounds. No wheezing.  Abdominal:     General: Bowel sounds are normal. There is no distension.     Palpations:  Abdomen is soft.     Tenderness: There is no abdominal tenderness. There is no guarding.  Musculoskeletal:        General: Normal range of motion.     Cervical back: Normal range of motion and neck supple.     Right lower leg: No edema.     Left lower leg: No edema.  Lymphadenopathy:     Cervical: No cervical adenopathy.  Skin:    General: Skin is warm and dry.     Capillary Refill: Capillary refill takes less than 2 seconds.     Findings: Rash present.     Comments: Erythematous macular rash in the folds under the breasts.   Neurological:     General: No focal deficit present.     Mental Status: She is alert and oriented to person, place, and time.  Psychiatric:        Mood and Affect: Mood normal.        Behavior: Behavior normal.        Thought Content: Thought content normal.        Judgment: Judgment normal.     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 Will plan to continue her ozempic as she has had good luck with this. I  do want to keep her on the CGM given that she has a history of hypoglycemic episodes. We need to keep a close eye on this. Will send refills       Relevant Medications   Continuous Blood Gluc Sensor (FREESTYLE LIBRE 2 SENSOR) MISC   Semaglutide, 1 MG/DOSE, 4 MG/3ML SOPN   Polymyositis (HCC)    Chronic. Pain is increased significantly at this time for unknown reasons. We discussed the use of her gabapentin and she has just restarted this. I recommend that she increase the dose weekly by '100mg'$  three times a day until she has control. Max dose at this time is '600mg'$  three times a day. If no control, we will reassess options.       Rash/skin eruption    Symptoms and presentation consistent with fungal infection of the skin. We have treated with oral diflucan in the past. Right now I recommend we try a different antifungal to see if we can get control of the symptoms.  Recommend keeping area cool and dry as much as possible. F/U if no improvement.       Relevant Medications   ketoconazole (NIZORAL) 2 % cream   Fibromyalgia   Other Visit Diagnoses     Hypoglycemia       Relevant Medications   Continuous Blood Gluc Sensor (FREESTYLE LIBRE 2 SENSOR) MISC   DDD (degenerative disc disease), lumbosacral       Relevant Orders   Ambulatory referral to Physical Therapy       Return in about 4 months (around 02/24/2023) for Chronic (45).   Worthy Keeler, DNP, AGNP-c 10/25/2022  1:25 PM

## 2022-10-25 NOTE — Patient Instructions (Signed)
I want you to increase your gabapentin by one tablet three times a day every 7 days. Stop when you reach '600mg'$  three times a day and let me know.   I have sent in the order for the freestyle and used the hypoglycemia as the diagnosis code so they should cover it.   I sent in ketoconazole for use under your breasts with the ketoconazole.   I have sent in the increased dose of ozempic for you to use.

## 2022-10-30 DIAGNOSIS — E119 Type 2 diabetes mellitus without complications: Secondary | ICD-10-CM | POA: Diagnosis not present

## 2022-11-05 ENCOUNTER — Telehealth: Payer: Self-pay

## 2022-11-05 NOTE — Telephone Encounter (Signed)
VMT pt requesting call back to discuss starting PREP on 11/18/22 MW [id number] at [facility]

## 2022-11-07 DIAGNOSIS — R21 Rash and other nonspecific skin eruption: Secondary | ICD-10-CM | POA: Insufficient documentation

## 2022-11-07 NOTE — Telephone Encounter (Signed)
Please advise 

## 2022-11-07 NOTE — Assessment & Plan Note (Signed)
Chronic. Pain is increased significantly at this time for unknown reasons. We discussed the use of her gabapentin and she has just restarted this. I recommend that she increase the dose weekly by '100mg'$  three times a day until she has control. Max dose at this time is '600mg'$  three times a day. If no control, we will reassess options.

## 2022-11-07 NOTE — Assessment & Plan Note (Signed)
Chronic. Will plan to continue her ozempic as she has had good luck with this. I do want to keep her on the CGM given that she has a history of hypoglycemic episodes. We need to keep a close eye on this. Will send refills

## 2022-11-07 NOTE — Assessment & Plan Note (Signed)
Symptoms and presentation consistent with fungal infection of the skin. We have treated with oral diflucan in the past. Right now I recommend we try a different antifungal to see if we can get control of the symptoms. Recommend keeping area cool and dry as much as possible. F/U if no improvement.

## 2022-11-18 DIAGNOSIS — M25552 Pain in left hip: Secondary | ICD-10-CM | POA: Diagnosis not present

## 2022-11-18 DIAGNOSIS — M25551 Pain in right hip: Secondary | ICD-10-CM | POA: Diagnosis not present

## 2022-11-19 ENCOUNTER — Other Ambulatory Visit: Payer: Self-pay | Admitting: Internal Medicine

## 2022-11-20 ENCOUNTER — Encounter: Payer: Self-pay | Admitting: Internal Medicine
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
## 2022-11-25 ENCOUNTER — Ambulatory Visit (HOSPITAL_BASED_OUTPATIENT_CLINIC_OR_DEPARTMENT_OTHER): Payer: Medicare PPO | Attending: Nurse Practitioner | Admitting: Physical Therapy

## 2022-11-25 ENCOUNTER — Other Ambulatory Visit (HOSPITAL_BASED_OUTPATIENT_CLINIC_OR_DEPARTMENT_OTHER): Payer: Self-pay

## 2022-11-25 ENCOUNTER — Encounter (HOSPITAL_BASED_OUTPATIENT_CLINIC_OR_DEPARTMENT_OTHER): Payer: Self-pay | Admitting: Physical Therapy

## 2022-11-25 ENCOUNTER — Other Ambulatory Visit: Payer: Self-pay

## 2022-11-25 DIAGNOSIS — M5137 Other intervertebral disc degeneration, lumbosacral region: Secondary | ICD-10-CM | POA: Insufficient documentation

## 2022-11-25 DIAGNOSIS — M6281 Muscle weakness (generalized): Secondary | ICD-10-CM | POA: Diagnosis not present

## 2022-11-25 DIAGNOSIS — M797 Fibromyalgia: Secondary | ICD-10-CM | POA: Diagnosis not present

## 2022-11-25 DIAGNOSIS — R293 Abnormal posture: Secondary | ICD-10-CM

## 2022-11-25 DIAGNOSIS — M51379 Other intervertebral disc degeneration, lumbosacral region without mention of lumbar back pain or lower extremity pain: Secondary | ICD-10-CM

## 2022-11-25 MED ORDER — FLUAD QUADRIVALENT 0.5 ML IM PRSY
0.5000 mL | PREFILLED_SYRINGE | Freq: Once | INTRAMUSCULAR | 0 refills | Status: AC
  Filled 2022-11-25: qty 0.5, 1d supply, fill #0

## 2022-11-25 NOTE — Telephone Encounter (Signed)
Laretta Bolster,  Please address meds wit patient   Mickel Baas,  Please handle prior auth

## 2022-11-27 ENCOUNTER — Ambulatory Visit: Payer: Medicare PPO | Admitting: Internal Medicine

## 2022-11-28 ENCOUNTER — Ambulatory Visit (HOSPITAL_BASED_OUTPATIENT_CLINIC_OR_DEPARTMENT_OTHER): Payer: Medicare PPO | Admitting: Physical Therapy

## 2022-11-28 ENCOUNTER — Encounter (HOSPITAL_BASED_OUTPATIENT_CLINIC_OR_DEPARTMENT_OTHER): Payer: Self-pay | Admitting: Physical Therapy

## 2022-11-28 DIAGNOSIS — R293 Abnormal posture: Secondary | ICD-10-CM

## 2022-11-28 DIAGNOSIS — M5137 Other intervertebral disc degeneration, lumbosacral region: Secondary | ICD-10-CM | POA: Diagnosis not present

## 2022-11-28 DIAGNOSIS — M797 Fibromyalgia: Secondary | ICD-10-CM | POA: Diagnosis not present

## 2022-11-28 DIAGNOSIS — M6281 Muscle weakness (generalized): Secondary | ICD-10-CM | POA: Diagnosis not present

## 2022-11-28 NOTE — Progress Notes (Signed)
[facility] PREP Weekly Session  Patient Details  Name: Pam [name] MRN: [id number] Date of Birth: 1951/05/20 Age: 72 y.o. PCP: Orma Render, NP  There were no vitals filed for this visit.   [facility] Weekly seesion - 11/28/22 1200       [facility] "PREP" Location   [facility] "PREP" Location [facility]      Weekly Session   Topic Discussed Goal setting and welcome to the program   scale of percevied exertion, fit testing   Classes attended to date 2             Barnett Hatter 11/28/2022, 12:11 PM
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
## 2022-12-02 NOTE — Progress Notes (Signed)
[facility] PREP Weekly Session  Patient Details  Name: Pam [name] MRN: [id number] Date of Birth: Apr 06, 1951 Age: 72 y.o. PCP: Orma Render, NP  Vitals:     [facility] Weekly seesion - 12/02/22 1200       [facility] "PREP" Location   [facility] "PREP" [facility]      Weekly Session   Topic Discussed Importance of resistance training;Other ways to be active    Minutes exercised this week 80 minutes    Classes attended to date [place] 12/02/2022, 12:42 PM

## 2022-12-03 ENCOUNTER — Ambulatory Visit (HOSPITAL_BASED_OUTPATIENT_CLINIC_OR_DEPARTMENT_OTHER): Payer: Medicare PPO | Admitting: Physical Therapy

## 2022-12-03 ENCOUNTER — Encounter (HOSPITAL_BASED_OUTPATIENT_CLINIC_OR_DEPARTMENT_OTHER): Payer: Self-pay | Admitting: Physical Therapy

## 2022-12-03 DIAGNOSIS — M6281 Muscle weakness (generalized): Secondary | ICD-10-CM

## 2022-12-03 DIAGNOSIS — M5137 Other intervertebral disc degeneration, lumbosacral region: Secondary | ICD-10-CM

## 2022-12-03 DIAGNOSIS — R293 Abnormal posture: Secondary | ICD-10-CM | POA: Diagnosis not present

## 2022-12-03 DIAGNOSIS — M797 Fibromyalgia: Secondary | ICD-10-CM | POA: Diagnosis not present
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
## 2022-12-09 ENCOUNTER — Encounter (HOSPITAL_BASED_OUTPATIENT_CLINIC_OR_DEPARTMENT_OTHER): Payer: Self-pay

## 2022-12-12 ENCOUNTER — Ambulatory Visit (HOSPITAL_BASED_OUTPATIENT_CLINIC_OR_DEPARTMENT_OTHER): Payer: Medicare PPO | Admitting: Physical Therapy

## 2022-12-12 NOTE — Progress Notes (Signed)
[facility] PREP Weekly Session  Patient Details  Name: Pam [name] MRN: [id number] Date of Birth: [birth year] Age: 72 y.o. PCP: Orma Render, NP  Vitals:   12/09/22 1030  Weight: 145 lb 9.6 oz (66 kg)     [facility] Weekly seesion - 12/12/22 1800       [facility] "PREP" Location   [facility] "PREP" Location [facility]      Weekly Session   Topic Discussed Healthy eating tips    Minutes exercised this week 57 minutes    Classes attended to date [place] 12/12/2022, 6:00 PM

## 2022-12-13 ENCOUNTER — Ambulatory Visit (HOSPITAL_BASED_OUTPATIENT_CLINIC_OR_DEPARTMENT_OTHER): Payer: Medicare PPO | Attending: Nurse Practitioner | Admitting: Physical Therapy

## 2022-12-13 DIAGNOSIS — R293 Abnormal posture: Secondary | ICD-10-CM | POA: Insufficient documentation

## 2022-12-13 DIAGNOSIS — M5137 Other intervertebral disc degeneration, lumbosacral region: Secondary | ICD-10-CM | POA: Diagnosis not present

## 2022-12-13 DIAGNOSIS — M6281 Muscle weakness (generalized): Secondary | ICD-10-CM | POA: Insufficient documentation

## 2022-12-13 DIAGNOSIS — M797 Fibromyalgia: Secondary | ICD-10-CM | POA: Insufficient documentation
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
## 2022-12-17 ENCOUNTER — Ambulatory Visit (HOSPITAL_BASED_OUTPATIENT_CLINIC_OR_DEPARTMENT_OTHER): Payer: Medicare PPO | Admitting: Physical Therapy

## 2022-12-18 ENCOUNTER — Other Ambulatory Visit: Payer: Self-pay | Admitting: Nurse Practitioner

## 2022-12-18 ENCOUNTER — Telehealth: Payer: Self-pay | Admitting: Nurse Practitioner

## 2022-12-18 DIAGNOSIS — E1149 Type 2 diabetes mellitus with other diabetic neurological complication: Secondary | ICD-10-CM

## 2022-12-18 DIAGNOSIS — E162 Hypoglycemia, unspecified: Secondary | ICD-10-CM | POA: Insufficient documentation

## 2022-12-18 NOTE — Telephone Encounter (Signed)
Can you send an order for FreeStyle Libre 2 to the DME service for me please? #2 R11 Diagnoses: DM2, hypoglycemia Thank you so much.

## 2022-12-18 NOTE — Telephone Encounter (Signed)
I have faxed this to [facility] for DME and pt was notified

## 2022-12-19 ENCOUNTER — Ambulatory Visit (HOSPITAL_BASED_OUTPATIENT_CLINIC_OR_DEPARTMENT_OTHER): Payer: Medicare PPO | Admitting: Physical Therapy

## 2022-12-19 ENCOUNTER — Encounter (HOSPITAL_BASED_OUTPATIENT_CLINIC_OR_DEPARTMENT_OTHER): Payer: Self-pay | Admitting: Physical Therapy

## 2022-12-19 ENCOUNTER — Telehealth: Payer: Self-pay | Admitting: Nurse Practitioner

## 2022-12-19 NOTE — Telephone Encounter (Signed)
Spoke with pt the other day and she had completed a P.A. for her [employer] 2 sensor & was denied & needed an appeal.  States has been on it a couple years,  said has hypoglycemic events > 54 yesterday, with this the alarm wakes her up, she isn't on insulin, she is on Ozempic.  Paying $75 with discount card, & paying $80 for Ozempic.  Case # [id number] t# [phone] with [employer]. Appeal was done & approved til 11/11/23.  Called pharmacy went thru for $0 co pay & pt informed. She also asked about assistance for her [place], printed pt assistance applications & mailed to pt, told her to take the [place] to prescribing doctor to sign

## 2022-12-20 NOTE — Telephone Encounter (Signed)
I got this approved thru her local pharmacy

## 2022-12-23 NOTE — Progress Notes (Signed)
[facility] PREP Weekly Session  Patient Details  Name: Pam [name] MRN: [id number] Date of Birth: 10/18/1951 Age: 72 y.o. PCP: Orma Render, NP  Vitals:   12/23/22 1030  Weight: 145 lb (65.8 kg)     [facility] Weekly seesion - 12/23/22 1200       [facility] "PREP" Location   [facility]      Weekly Session   Topic Discussed Restaurant Eating   Salt and sugar demos   Minutes exercised this week 140 minutes    Classes attended to date [place] 12/23/2022, 12:37 PM

## 2022-12-24 ENCOUNTER — Ambulatory Visit (HOSPITAL_BASED_OUTPATIENT_CLINIC_OR_DEPARTMENT_OTHER): Payer: Medicare PPO | Admitting: Physical Therapy

## 2022-12-26 ENCOUNTER — Ambulatory Visit (HOSPITAL_BASED_OUTPATIENT_CLINIC_OR_DEPARTMENT_OTHER): Payer: Medicare PPO | Admitting: Physical Therapy

## 2022-12-27 ENCOUNTER — Encounter (HOSPITAL_BASED_OUTPATIENT_CLINIC_OR_DEPARTMENT_OTHER): Payer: Self-pay | Admitting: Physical Therapy

## 2022-12-27 ENCOUNTER — Ambulatory Visit (HOSPITAL_BASED_OUTPATIENT_CLINIC_OR_DEPARTMENT_OTHER): Payer: Medicare PPO | Admitting: Physical Therapy

## 2022-12-27 DIAGNOSIS — M6281 Muscle weakness (generalized): Secondary | ICD-10-CM | POA: Diagnosis not present

## 2022-12-27 DIAGNOSIS — R293 Abnormal posture: Secondary | ICD-10-CM

## 2022-12-27 DIAGNOSIS — M797 Fibromyalgia: Secondary | ICD-10-CM | POA: Diagnosis not present

## 2022-12-27 DIAGNOSIS — M5137 Other intervertebral disc degeneration, lumbosacral region: Secondary | ICD-10-CM | POA: Diagnosis not present
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
## 2022-12-30 DIAGNOSIS — I25118 Atherosclerotic heart disease of native coronary artery with other forms of angina pectoris: Secondary | ICD-10-CM | POA: Diagnosis not present

## 2022-12-30 DIAGNOSIS — E785 Hyperlipidemia, unspecified: Secondary | ICD-10-CM | POA: Diagnosis not present

## 2022-12-30 LAB — COMPREHENSIVE METABOLIC PANEL
ALT: 15 IU/L (ref 0–32)
AST: 17 IU/L (ref 0–40)
Albumin/Globulin Ratio: 1.7 (ref 1.2–2.2)
Albumin: 4.3 g/dL (ref 3.8–4.8)
Alkaline Phosphatase: 64 IU/L (ref 44–121)
BUN/Creatinine Ratio: 20 (ref 12–28)
BUN: 13 mg/dL (ref 8–27)
Bilirubin Total: 0.3 mg/dL (ref 0.0–1.2)
CO2: 23 mmol/L (ref 20–29)
Calcium: 9.6 mg/dL (ref 8.7–10.3)
Chloride: 106 mmol/L (ref 96–106)
Creatinine, Ser: 0.64 mg/dL (ref 0.57–1.00)
Globulin, Total: 2.6 g/dL (ref 1.5–4.5)
Glucose: 94 mg/dL (ref 70–99)
Potassium: 4.2 mmol/L (ref 3.5–5.2)
Sodium: 143 mmol/L (ref 134–144)
Total Protein: 6.9 g/dL (ref 6.0–8.5)
eGFR: 94 mL/min/{1.73_m2} (ref >59)

## 2022-12-30 LAB — LIPID PANEL
Chol/HDL Ratio: 1.7 ratio (ref 0.0–4.4)
Cholesterol, Total: 85 mg/dL — ABNORMAL LOW (ref 100–199)
HDL: 51 mg/dL (ref >39)
LDL Chol Calc (NIH): 21 mg/dL (ref 0–99)
Triglycerides: 55 mg/dL (ref 0–149)
VLDL Cholesterol Cal: 13 mg/dL (ref 5–40)

## 2022-12-31 ENCOUNTER — Ambulatory Visit (HOSPITAL_BASED_OUTPATIENT_CLINIC_OR_DEPARTMENT_OTHER): Payer: Medicare PPO | Admitting: Physical Therapy

## 2023-01-01 NOTE — Progress Notes (Signed)
[facility] PREP Weekly Session  Patient Details  Name: Pam [name] MRN: [id number] Date of Birth: December 07, 1950 Age: 72 y.o. PCP: Orma Render, NP  Vitals:   12/30/22 0956  Weight: 144 lb 3.2 oz (65.4 kg)     [facility] Weekly seesion - 01/01/23 0900       [facility] "PREP" Location   [facility] "PREP" Location [facility]      Weekly Session   Topic Discussed Stress management and problem solving    Minutes exercised this week 60 minutes    Classes attended to date [place] 01/01/2023, 9:57 AM

## 2023-01-02 ENCOUNTER — Ambulatory Visit (HOSPITAL_BASED_OUTPATIENT_CLINIC_OR_DEPARTMENT_OTHER): Payer: Medicare PPO | Admitting: Cardiovascular Disease

## 2023-01-02 ENCOUNTER — Encounter (HOSPITAL_BASED_OUTPATIENT_CLINIC_OR_DEPARTMENT_OTHER): Payer: Self-pay | Admitting: Cardiovascular Disease

## 2023-01-02 VITALS — BP 110/60 | HR 71 | Ht 61.0 in | Wt 150.5 lb

## 2023-01-02 DIAGNOSIS — R42 Dizziness and giddiness: Secondary | ICD-10-CM

## 2023-01-02 DIAGNOSIS — E7800 Pure hypercholesterolemia, unspecified: Secondary | ICD-10-CM | POA: Diagnosis not present

## 2023-01-02 DIAGNOSIS — I2510 Atherosclerotic heart disease of native coronary artery without angina pectoris: Secondary | ICD-10-CM | POA: Diagnosis not present

## 2023-01-02 DIAGNOSIS — I25118 Atherosclerotic heart disease of native coronary artery with other forms of angina pectoris: Secondary | ICD-10-CM | POA: Insufficient documentation

## 2023-01-02 HISTORY — DX: Atherosclerotic heart disease of native coronary artery without angina pectoris: I25.10

## 2023-01-02 NOTE — Assessment & Plan Note (Signed)
Lipids are much better controlled on Repatha and rosuvastatin.  Continue current regimen of rosuvastatin and Repatha.

## 2023-01-02 NOTE — Assessment & Plan Note (Signed)
She continues to have episodes of dizziness once or twice per week.  She notes it with sudden movements.  Seems most likely to be vertigo.  She tries to be conscientious about not moving suddenly.  Her symptoms are not orthostatic.  She will work on her allergy management.

## 2023-01-02 NOTE — Assessment & Plan Note (Addendum)
Non-obstructive CAD.  Shew as encouraged to continue exercising and keep her BP and lipids under control.    Continue aspirin, rosuvastatin and Repatha.
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
## 2023-01-02 NOTE — Patient Instructions (Signed)
Medication Instructions:  Your physician recommends that you continue on your current medications as directed. Please refer to the Current Medication list given to you today.  *If you need a refill on your cardiac medications before your next appointment, please call your pharmacy*  Lab Work: NONE  Testing/Procedures: NONE  Follow-Up: At [facility], you and your health needs are our priority.  As part of our continuing mission to provide you with exceptional heart care, we have created designated Provider Care Teams.  These Care Teams include your primary Cardiologist (physician) and Advanced Practice Providers (APPs -  Physician Assistants and Nurse Practitioners) who all work together to provide you with the care you need, when you need it.  We recommend signing up for the patient portal called "MyChart".  Sign up information is provided on this After Visit Summary.  MyChart is used to connect with patients for Virtual Visits (Telemedicine).  Patients are able to view lab/test results, encounter notes, upcoming appointments, etc.  Non-urgent messages can be sent to your provider as well.   To learn more about what you can do with MyChart, go to [email or web address].    Your next appointment:   6 month(s)  The format for your next appointment:   In Person  Provider:   Overton Mam NP   [facility]

## 2023-01-03 ENCOUNTER — Ambulatory Visit (HOSPITAL_BASED_OUTPATIENT_CLINIC_OR_DEPARTMENT_OTHER): Payer: Medicare PPO | Admitting: Physical Therapy

## 2023-01-03 ENCOUNTER — Encounter (HOSPITAL_BASED_OUTPATIENT_CLINIC_OR_DEPARTMENT_OTHER): Payer: Self-pay | Admitting: Physical Therapy

## 2023-01-03 DIAGNOSIS — M797 Fibromyalgia: Secondary | ICD-10-CM | POA: Diagnosis not present

## 2023-01-03 DIAGNOSIS — M5137 Other intervertebral disc degeneration, lumbosacral region: Secondary | ICD-10-CM

## 2023-01-03 DIAGNOSIS — M6281 Muscle weakness (generalized): Secondary | ICD-10-CM

## 2023-01-03 DIAGNOSIS — R293 Abnormal posture: Secondary | ICD-10-CM
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
## 2023-01-07 ENCOUNTER — Ambulatory Visit (HOSPITAL_BASED_OUTPATIENT_CLINIC_OR_DEPARTMENT_OTHER): Payer: Medicare PPO | Admitting: Physical Therapy

## 2023-01-08 ENCOUNTER — Ambulatory Visit: Payer: Medicare PPO | Admitting: Internal Medicine

## 2023-01-08 ENCOUNTER — Encounter: Payer: Self-pay | Admitting: Internal Medicine

## 2023-01-08 ENCOUNTER — Other Ambulatory Visit: Payer: Self-pay

## 2023-01-08 VITALS — BP 140/62 | HR 75 | Temp 98.0°F | Resp 16 | Ht 61.0 in | Wt 147.9 lb

## 2023-01-08 DIAGNOSIS — J3089 Other allergic rhinitis: Secondary | ICD-10-CM

## 2023-01-08 DIAGNOSIS — R42 Dizziness and giddiness: Secondary | ICD-10-CM

## 2023-01-08 DIAGNOSIS — J4541 Moderate persistent asthma with (acute) exacerbation: Secondary | ICD-10-CM | POA: Diagnosis not present

## 2023-01-08 DIAGNOSIS — J302 Other seasonal allergic rhinitis: Secondary | ICD-10-CM

## 2023-01-08 MED ORDER — LEVOCETIRIZINE DIHYDROCHLORIDE 5 MG PO TABS: 5.0000 mg | ORAL_TABLET | Freq: Every evening | ORAL | 5 refills | Status: DC

## 2023-01-08 MED ORDER — MOMETASONE FUROATE 50 MCG/ACT NA SUSP: 2.0000 | Freq: Every day | NASAL | 5 refills | Status: DC | PRN

## 2023-01-08 MED ORDER — ALBUTEROL SULFATE HFA 108 (90 BASE) MCG/ACT IN AERS: INHALATION_SPRAY | RESPIRATORY_TRACT | 1 refills | Status: DC

## 2023-01-08 MED ORDER — MONTELUKAST SODIUM 10 MG PO TABS: ORAL_TABLET | ORAL | 3 refills | Status: DC

## 2023-01-08 MED ORDER — ALBUTEROL SULFATE (2.5 MG/3ML) 0.083% IN NEBU: 2.5000 mg | INHALATION_SOLUTION | Freq: Three times a day (TID) | RESPIRATORY_TRACT | 5 refills | Status: DC | PRN

## 2023-01-08 MED ORDER — BUDESONIDE-FORMOTEROL FUMARATE 160-4.5 MCG/ACT IN AERO: 2.0000 | INHALATION_SPRAY | Freq: Two times a day (BID) | RESPIRATORY_TRACT | 5 refills | Status: DC
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
## 2023-01-08 NOTE — Patient Instructions (Addendum)
Mild Persistent Asthma: Start Symbicort 160 mcg 2 puffs twice a day, with a spacer - use in place of Symbicort 80.  - For flare/respiratory illness: add pulmicort (budesonide) 1 vial twice a day for 2 weeks or until your symptoms improve - Rinse mouth out after use - Use Albuterol (Proair/Ventolin) 2 puffs every 4-6 hours as needed for chest tightness, wheezing, or coughing  - Use Albuterol (Proair/Ventolin) 2 puffs 15 minutes prior to exercise/walking stairs/running after puppy if you have symptoms with activity  - Use a spacer with all inhalers - Asthma is not controlled if:  - Symptoms are occurring >2 times a week OR  - >2 times a month nighttime awakenings  - Please call the clinic to schedule a follow up if these symptoms arise  Recommend getting Covid booster  Seasonal and perennial allergic rhinitis: - previous allergy testing positive to grasses, weeds, trees, dust mites, cat, dog, horse, cockroach. - allergen avoidance as below - consider allergy shots as long term control of your symptoms by teaching your immune system to be more tolerant of your allergy triggers Before medicated nasal sprays, use nasal saline spray. - Continue Nasal Steroid Spray: Rhinocort (budesonide) 1- 2 sprays in each nostril daily (can buy over-the-counter if not covered by insurance)  Best results if used daily. - Continue over the counter antihistamine daily or daily as needed.   -Your options include Zyrtec (Cetirizine) '10mg'$ , Claritin (Loratadine) '10mg'$ , Allegra (Fexofenadine) '180mg'$ , or Xyzal (Levocetirinze) '5mg'$   Vertigo:  - referral to ENT  Follow-up in 3 months, sooner if needed.   It was a pleasure seeing you again in clinic today!   Sigurd Sos, MD [facility]  ---------------------------------------------------------------- Reducing Pollen Exposure  The American Academy of Allergy, Asthma and Immunology suggests the following steps to reduce your exposure to pollen  during allergy seasons.    Do not hang sheets or clothing out to dry; pollen may collect on these items. Do not mow lawns or spend time around freshly cut grass; mowing stirs up pollen. Keep windows closed at night.  Keep car windows closed while driving. Minimize morning activities outdoors, a time when pollen counts are usually at their highest. Stay indoors as much as possible when pollen counts or humidity is high and on windy days when pollen tends to remain in the air longer. Use air conditioning when possible.  Many air conditioners have filters that trap the pollen spores. Use a HEPA room air filter to remove pollen form the indoor air you breathe.  DUST MITE AVOIDANCE MEASURES:  There are three main measures that need and can be taken to avoid house dust mites:  Reduce accumulation of dust in general -reduce furniture, clothing, carpeting, books, stuffed animals, especially in bedroom  Separate yourself from the dust -use pillow and mattress encasements (can be found at stores such as [employer] or online) -avoid direct exposure to air condition flow -use a HEPA filter device, especially in the bedroom; you can also use a HEPA filter vacuum cleaner -wipe dust with a moist towel instead of a dry towel or broom when cleaning  Decrease mites and/or their secretions -wash clothing and linen and stuffed animals at highest temperature possible, at least every 2 weeks -stuffed animals can also be placed in a bag and put in a freezer overnight  Despite the above measures, it is impossible to eliminate dust mites or their allergen completely from your home.  With the above measures  the burden of mites in your home can be diminished, with the goal of minimizing your allergic symptoms.  Success will be reached only when implementing and using all means together.  Control of Dog or Cat Allergen  Avoidance is the best way to manage a dog or cat allergy. If you have a dog or cat  and are allergic to dog or cats, consider removing the dog or cat from the home. If you have a dog or cat but don't want to find it a new home, or if your family wants a pet even though someone in the household is allergic, here are some strategies that may help keep symptoms at bay:  Keep the pet out of your bedroom and restrict it to only a few rooms. Be advised that keeping the dog or cat in only one room will not limit the allergens to that room. Don't pet, hug or kiss the dog or cat; if you do, wash your hands with soap and water. High-efficiency particulate air (HEPA) cleaners run continuously in a bedroom or living room can reduce allergen levels over time. Regular use of a high-efficiency vacuum cleaner or a central vacuum can reduce allergen levels. Giving your dog or cat a bath at least once a week can reduce airborne allergen. Control of Cockroach Allergen  Cockroach allergen has been identified as an important cause of acute attacks of asthma, especially in urban settings.  There are fifty-five species of cockroach that exist in the [place], however only three, the [place], [place] species produce allergen that can affect patients with Asthma.  Allergens can be obtained from fecal particles, egg casings and secretions from cockroaches.    Remove food sources. Reduce access to water. Seal access and entry points. Spray runways with 0.5-1% Diazinon or Chlorpyrifos Blow boric acid power under stoves and refrigerator. Place bait stations (hydramethylnon) at feeding sites.  Control of Mold Allergen   Mold and fungi can grow on a variety of surfaces provided certain temperature and moisture conditions exist.  Outdoor molds grow on plants, decaying vegetation and soil.  The major outdoor mold, Alternaria and Cladosporium, are found in very high numbers during hot and dry conditions.  Generally, a late Summer - Fall peak is seen for common outdoor fungal spores.  Rain will  temporarily lower outdoor mold spore count, but counts rise rapidly when the rainy period ends.  The most important indoor molds are Aspergillus and Penicillium.  Dark, humid and poorly ventilated basements are ideal sites for mold growth.  The next most common sites of mold growth are the bathroom and the kitchen.  Outdoor (Seasonal) Mold Control  Use air conditioning and keep windows closed Avoid exposure to decaying vegetation. Avoid leaf raking. Avoid grain handling. Consider wearing a face mask if working in moldy areas.    Indoor (Perennial) Mold Control   Maintain humidity below 50%. Clean washable surfaces with 5% bleach solution. Remove sources e.g. contaminated carpets.

## 2023-01-09 ENCOUNTER — Ambulatory Visit (HOSPITAL_BASED_OUTPATIENT_CLINIC_OR_DEPARTMENT_OTHER): Payer: Medicare PPO | Admitting: Physical Therapy

## 2023-01-09 ENCOUNTER — Encounter (HOSPITAL_BASED_OUTPATIENT_CLINIC_OR_DEPARTMENT_OTHER): Payer: Self-pay | Admitting: Physical Therapy

## 2023-01-09 DIAGNOSIS — R293 Abnormal posture: Secondary | ICD-10-CM

## 2023-01-09 DIAGNOSIS — M6281 Muscle weakness (generalized): Secondary | ICD-10-CM

## 2023-01-09 DIAGNOSIS — M797 Fibromyalgia: Secondary | ICD-10-CM

## 2023-01-09 DIAGNOSIS — M5137 Other intervertebral disc degeneration, lumbosacral region: Secondary | ICD-10-CM
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
## 2023-01-10 ENCOUNTER — Ambulatory Visit (HOSPITAL_BASED_OUTPATIENT_CLINIC_OR_DEPARTMENT_OTHER): Payer: Medicare PPO | Admitting: Physical Therapy

## 2023-01-14 ENCOUNTER — Ambulatory Visit (HOSPITAL_BASED_OUTPATIENT_CLINIC_OR_DEPARTMENT_OTHER): Payer: Medicare PPO | Admitting: Physical Therapy

## 2023-01-14 NOTE — Progress Notes (Signed)
[facility] PREP Weekly Session  Patient Details  Name: Pam [name] MRN: [id number] Date of Birth: [birth year] Age: 72 y.o. PCP: Orma Render, NP  Vitals:   01/13/23 1030  Weight: 146 lb (66.2 kg)     [facility] Weekly seesion - 01/14/23 1600       [facility] "PREP" Location   [facility] "PREP" Location [facility]      Weekly Session   Topic Discussed --   portions   Minutes exercised this week 75 minutes    Classes attended to date [place] 01/14/2023, 4:24 PM

## 2023-01-16 ENCOUNTER — Ambulatory Visit (HOSPITAL_BASED_OUTPATIENT_CLINIC_OR_DEPARTMENT_OTHER): Payer: Medicare PPO | Admitting: Physical Therapy

## 2023-01-17 ENCOUNTER — Ambulatory Visit (HOSPITAL_BASED_OUTPATIENT_CLINIC_OR_DEPARTMENT_OTHER): Payer: Medicare PPO | Attending: Nurse Practitioner | Admitting: Physical Therapy

## 2023-01-17 ENCOUNTER — Encounter (HOSPITAL_BASED_OUTPATIENT_CLINIC_OR_DEPARTMENT_OTHER): Payer: Self-pay | Admitting: Physical Therapy

## 2023-01-17 DIAGNOSIS — R293 Abnormal posture: Secondary | ICD-10-CM | POA: Diagnosis not present

## 2023-01-17 DIAGNOSIS — M5137 Other intervertebral disc degeneration, lumbosacral region: Secondary | ICD-10-CM | POA: Insufficient documentation

## 2023-01-17 DIAGNOSIS — M6281 Muscle weakness (generalized): Secondary | ICD-10-CM | POA: Insufficient documentation

## 2023-01-17 DIAGNOSIS — M797 Fibromyalgia: Secondary | ICD-10-CM | POA: Diagnosis not present
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
## 2023-01-21 ENCOUNTER — Ambulatory Visit (HOSPITAL_BASED_OUTPATIENT_CLINIC_OR_DEPARTMENT_OTHER): Payer: Medicare PPO | Admitting: Physical Therapy

## 2023-01-23 ENCOUNTER — Ambulatory Visit (HOSPITAL_BASED_OUTPATIENT_CLINIC_OR_DEPARTMENT_OTHER): Payer: Medicare PPO | Admitting: Physical Therapy

## 2023-01-23 NOTE — Progress Notes (Signed)
[facility] PREP Weekly Session  Patient Details  Name: Pam [name] MRN: [id number] Date of Birth: 1950/12/09 Age: 72 y.o. PCP: Orma Render, NP  Vitals:   01/20/23 1030  Weight: 146 lb 12.8 oz (66.6 kg)     [facility] Weekly seesion - 01/23/23 1200       [facility] "PREP" Location   [facility] "PREP" Location [facility]      Weekly Session   Topic Discussed Finding support    Minutes exercised this week 95 minutes    Classes attended to date 60             [place] 01/23/2023, 12:04 PM

## 2023-01-24 ENCOUNTER — Ambulatory Visit (HOSPITAL_BASED_OUTPATIENT_CLINIC_OR_DEPARTMENT_OTHER): Payer: Medicare PPO | Admitting: Physical Therapy

## 2023-01-29 ENCOUNTER — Ambulatory Visit: Payer: Medicare PPO | Admitting: Neurology

## 2023-01-29 NOTE — Progress Notes (Signed)
[facility] PREP Weekly Session  Patient Details  Name: Pam [name] MRN: [id number] Date of Birth: 03-20-1951 Age: 72 y.o. PCP: Orma Render, NP  Vitals:   01/27/23 1030  Weight: 144 lb 3.2 oz (65.4 kg)     [facility] Weekly seesion - 01/29/23 0900       [facility] "PREP" Location   [facility] "PREP" Location [facility]      Weekly Session   Topic Discussed Calorie breakdown    Minutes exercised this week 115 minutes    Classes attended to date 17             Barnett Hatter 01/29/2023, 9:49 AM

## 2023-01-30 ENCOUNTER — Telehealth: Payer: Self-pay | Admitting: Nurse Practitioner

## 2023-01-30 NOTE — Telephone Encounter (Signed)
Contacted Naydeen Dasher to schedule their annual wellness visit. Appointment made for 02/04/23.  [employer] AW[facility] direct phone # [phone]

## 2023-02-04 ENCOUNTER — Telehealth: Payer: Self-pay

## 2023-02-04 ENCOUNTER — Ambulatory Visit (INDEPENDENT_AMBULATORY_CARE_PROVIDER_SITE_OTHER): Payer: Medicare PPO

## 2023-02-04 VITALS — Ht 61.0 in | Wt 144.8 lb

## 2023-02-04 DIAGNOSIS — Z0000 Encounter for general adult medical examination without abnormal findings: Secondary | ICD-10-CM | POA: Diagnosis not present

## 2023-02-04 NOTE — Patient Instructions (Signed)
[name] , Thank you for taking time to come for your Medicare Wellness Visit. I appreciate your ongoing commitment to your health goals. Please review the following plan we discussed and let me know if I can assist you in the future.   These are the goals we discussed:  Goals      Patient Stated     02/04/2023, to be at a healthy weight        This is a list of the screening recommended for you and due dates:  Health Maintenance  Topic Date Due   Zoster (Shingles) Vaccine (1 of 2) Never done   DTaP/Tdap/Td vaccine (2 - Td or Tdap) 03/11/2014   Pneumonia Vaccine (2 of 2 - PPSV23 or PCV20) 03/05/2019   Eye exam for diabetics  10/14/2019   Yearly kidney health urinalysis for diabetes  01/01/2020   COVID-19 Vaccine (2 - 2023-24 season) 07/12/2022   Colon Cancer Screening  12/17/2022   Hemoglobin A1C  01/23/2023   Complete foot exam   07/26/2023   Yearly kidney function blood test for diabetes  12/31/2023   Medicare Annual Wellness Visit  02/04/2024   Mammogram  03/28/2024   Flu Shot  Completed   DEXA scan (bone density measurement)  Completed   Hepatitis C Screening: USPSTF Recommendation to screen - Ages 52-79 yo.  Completed   HPV Vaccine  Aged Out    Advanced directives: Advance directive discussed with you today.   Conditions/risks identified: none  Next appointment: Follow up in one year for your annual wellness visit    Preventive Care 65 Years and Older, Female Preventive care refers to lifestyle choices and visits with your health care provider that can promote health and wellness. What does preventive care include? A yearly physical exam. This is also called an annual well check. Dental exams once or twice a year. Routine eye exams. Ask your health care provider how often you should have your eyes checked. Personal lifestyle choices, including: Daily care of your teeth and gums. Regular physical activity. Eating a healthy diet. Avoiding tobacco and drug  use. Limiting alcohol use. Practicing safe sex. Taking low-dose aspirin every day. Taking vitamin and mineral supplements as recommended by your health care provider. What happens during an annual well check? The services and screenings done by your health care provider during your annual well check will depend on your age, overall health, lifestyle risk factors, and family history of disease. Counseling  Your health care provider may ask you questions about your: Alcohol use. Tobacco use. Drug use. Emotional well-being. Home and relationship well-being. Sexual activity. Eating habits. History of falls. Memory and ability to understand (cognition). Work and work Statistician. Reproductive health. Screening  You may have the following tests or measurements: Height, weight, and BMI. Blood pressure. Lipid and cholesterol levels. These may be checked every 5 years, or more frequently if you are over 64 years old. Skin check. Lung cancer screening. You may have this screening every year starting at age 3 if you have a 30-pack-year history of smoking and currently smoke or have quit within the past 15 years. Fecal occult blood test (FOBT) of the stool. You may have this test every year starting at age 47. Flexible sigmoidoscopy or colonoscopy. You may have a sigmoidoscopy every 5 years or a colonoscopy every 10 years starting at age 11. Hepatitis C blood test. Hepatitis B blood test. Sexually transmitted disease (STD) testing. Diabetes screening. This is done by checking your blood sugar (  glucose) after you have not eaten for a while (fasting). You may have this done every 1-3 years. Bone density scan. This is done to screen for osteoporosis. You may have this done starting at age 64. Mammogram. This may be done every 1-2 years. Talk to your health care provider about how often you should have regular mammograms. Talk with your health care provider about your test results, treatment  options, and if necessary, the need for more tests. Vaccines  Your health care provider may recommend certain vaccines, such as: Influenza vaccine. This is recommended every year. Tetanus, diphtheria, and acellular pertussis (Tdap, Td) vaccine. You may need a Td booster every 10 years. Zoster vaccine. You may need this after age 50. Pneumococcal 13-valent conjugate (PCV13) vaccine. One dose is recommended after age 11. Pneumococcal polysaccharide (PPSV23) vaccine. One dose is recommended after age 49. Talk to your health care provider about which screenings and vaccines you need and how often you need them. This information is not intended to replace advice given to you by your health care provider. Make sure you discuss any questions you have with your health care provider. Document Released: 11/24/2015 Document Revised: 07/17/2016 Document Reviewed: 08/29/2015 Elsevier Interactive Patient Education  2017 [place] Prevention in the Home Falls can cause injuries. They can happen to people of all ages. There are many things you can do to make your home safe and to help prevent falls. What can I do on the outside of my home? Regularly fix the edges of walkways and driveways and fix any cracks. Remove anything that might make you trip as you walk through a door, such as a raised step or threshold. Trim any bushes or trees on the path to your home. Use bright outdoor lighting. Clear any walking paths of anything that might make someone trip, such as rocks or tools. Regularly check to see if handrails are loose or broken. Make sure that both sides of any steps have handrails. Any raised decks and porches should have guardrails on the edges. Have any leaves, snow, or ice cleared regularly. Use sand or salt on walking paths during winter. Clean up any spills in your garage right away. This includes oil or grease spills. What can I do in the bathroom? Use night lights. Install grab  bars by the toilet and in the tub and shower. Do not use towel bars as grab bars. Use non-skid mats or decals in the tub or shower. If you need to sit down in the shower, use a plastic, non-slip stool. Keep the floor dry. Clean up any water that spills on the floor as soon as it happens. Remove soap buildup in the tub or shower regularly. Attach bath mats securely with double-sided non-slip rug tape. Do not have throw rugs and other things on the floor that can make you trip. What can I do in the bedroom? Use night lights. Make sure that you have a light by your bed that is easy to reach. Do not use any sheets or blankets that are too big for your bed. They should not hang down onto the floor. Have a firm chair that has side arms. You can use this for support while you get dressed. Do not have throw rugs and other things on the floor that can make you trip. What can I do in the kitchen? Clean up any spills right away. Avoid walking on wet floors. Keep items that you use a lot in easy-to-reach places.  If you need to reach something above you, use a strong step stool that has a grab bar. Keep electrical cords out of the way. Do not use floor polish or wax that makes floors slippery. If you must use wax, use non-skid floor wax. Do not have throw rugs and other things on the floor that can make you trip. What can I do with my stairs? Do not leave any items on the stairs. Make sure that there are handrails on both sides of the stairs and use them. Fix handrails that are broken or loose. Make sure that handrails are as long as the stairways. Check any carpeting to make sure that it is firmly attached to the stairs. Fix any carpet that is loose or worn. Avoid having throw rugs at the top or bottom of the stairs. If you do have throw rugs, attach them to the floor with carpet tape. Make sure that you have a light switch at the top of the stairs and the bottom of the stairs. If you do not have them,  ask someone to add them for you. What else can I do to help prevent falls? Wear shoes that: Do not have high heels. Have rubber bottoms. Are comfortable and fit you well. Are closed at the toe. Do not wear sandals. If you use a stepladder: Make sure that it is fully opened. Do not climb a closed stepladder. Make sure that both sides of the stepladder are locked into place. Ask someone to hold it for you, if possible. Clearly mark and make sure that you can see: Any grab bars or handrails. First and last steps. Where the edge of each step is. Use tools that help you move around (mobility aids) if they are needed. These include: Canes. Walkers. Scooters. Crutches. Turn on the lights when you go into a dark area. Replace any light bulbs as soon as they burn out. Set up your furniture so you have a clear path. Avoid moving your furniture around. If any of your floors are uneven, fix them. If there are any pets around you, be aware of where they are. Review your medicines with your doctor. Some medicines can make you feel dizzy. This can increase your chance of falling. Ask your doctor what other things that you can do to help prevent falls. This information is not intended to replace advice given to you by your health care provider. Make sure you discuss any questions you have with your health care provider. Document Released: 08/24/2009 Document Revised: 04/04/2016 Document Reviewed: 12/02/2014 Elsevier Interactive Patient Education  2017 [employer].
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
## 2023-02-04 NOTE — Addendum Note (Signed)
Addended by: Kellie Simmering on: 02/04/2023 11:55 AM   Modules accepted: Orders

## 2023-02-04 NOTE — Telephone Encounter (Signed)
   Telephone encounter was:  Unsuccessful.  02/04/2023 Name: Pam [name] MRN: [id number] DOB: 17-Jan-1951  Unsuccessful outbound call made today to assist with:  Transportation Needs   Outreach Attempt:  1st Attempt  A HIPAA compliant voice message was left requesting a return call.  Instructed patient to call back .    [facility] [phone] [address], [place], [place] [address] Phone: [phone] Email: [email or web address]

## 2023-02-05 ENCOUNTER — Telehealth: Payer: Self-pay

## 2023-02-05 NOTE — Telephone Encounter (Signed)
   Telephone encounter was:  Unsuccessful.  02/05/2023 Name: Pam [name] MRN: [id number] DOB: [birth year]  Unsuccessful outbound call made today to assist with:  Transportation Needs   Outreach Attempt:  2nd Attempt  A HIPAA compliant voice message was left requesting a return call.  Instructed patient to call back .    [facility] [phone] [address], [place], [place] [address] Phone: [phone] Email: [email or web address]

## 2023-02-06 ENCOUNTER — Telehealth: Payer: Self-pay

## 2023-02-06 ENCOUNTER — Encounter (HOSPITAL_BASED_OUTPATIENT_CLINIC_OR_DEPARTMENT_OTHER): Payer: Self-pay

## 2023-02-06 NOTE — Telephone Encounter (Signed)
   Telephone encounter was:  Unsuccessful.  02/06/2023 Name: Pam [name] MRN: [id number] DOB: 1951-08-27  Unsuccessful outbound call made today to assist with:  Transportation Needs   Outreach Attempt:  3rd Attempt.  Referral closed unable to contact patient.  A HIPAA compliant voice message was left requesting a return call.  Instructed patient to call back  .    [facility] [phone] [address], [place], [place] [address] Phone: [phone] Email: [email or web address]
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
## 2023-02-10 ENCOUNTER — Telehealth (HOSPITAL_BASED_OUTPATIENT_CLINIC_OR_DEPARTMENT_OTHER): Payer: Self-pay | Admitting: Family

## 2023-02-10 NOTE — Telephone Encounter (Signed)
Returned call to the number provided to give verbal order- verbal given.

## 2023-02-10 NOTE — Telephone Encounter (Signed)
Please advise. Thank you

## 2023-02-10 NOTE — Telephone Encounter (Signed)
New Message: Ref#  [id number]    She needs  a verbal approval to replace damage medication(Repatha).
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
## 2023-02-25 ENCOUNTER — Ambulatory Visit: Payer: Medicare PPO | Admitting: Nurse Practitioner

## 2023-02-26 ENCOUNTER — Ambulatory Visit: Payer: Medicare PPO | Admitting: Internal Medicine

## 2023-02-27 ENCOUNTER — Other Ambulatory Visit: Payer: Self-pay

## 2023-02-27 DIAGNOSIS — Z789 Other specified health status: Secondary | ICD-10-CM

## 2023-02-28 ENCOUNTER — Ambulatory Visit: Payer: Medicare PPO | Admitting: Nurse Practitioner

## 2023-02-28 ENCOUNTER — Encounter: Payer: Self-pay | Admitting: Neurology

## 2023-02-28 ENCOUNTER — Encounter: Payer: Self-pay | Admitting: Nurse Practitioner

## 2023-02-28 VITALS — BP 128/72 | HR 80 | Wt 146.2 lb

## 2023-02-28 DIAGNOSIS — I2510 Atherosclerotic heart disease of native coronary artery without angina pectoris: Secondary | ICD-10-CM

## 2023-02-28 DIAGNOSIS — I10 Essential (primary) hypertension: Secondary | ICD-10-CM | POA: Diagnosis not present

## 2023-02-28 DIAGNOSIS — E7800 Pure hypercholesterolemia, unspecified: Secondary | ICD-10-CM | POA: Diagnosis not present

## 2023-02-28 DIAGNOSIS — E118 Type 2 diabetes mellitus with unspecified complications: Secondary | ICD-10-CM

## 2023-02-28 DIAGNOSIS — J4540 Moderate persistent asthma, uncomplicated: Secondary | ICD-10-CM | POA: Diagnosis not present

## 2023-02-28 DIAGNOSIS — E559 Vitamin D deficiency, unspecified: Secondary | ICD-10-CM | POA: Diagnosis not present

## 2023-02-28 DIAGNOSIS — E162 Hypoglycemia, unspecified: Secondary | ICD-10-CM | POA: Diagnosis not present

## 2023-02-28 DIAGNOSIS — R42 Dizziness and giddiness: Secondary | ICD-10-CM | POA: Diagnosis not present

## 2023-02-28 DIAGNOSIS — R413 Other amnesia: Secondary | ICD-10-CM | POA: Diagnosis not present

## 2023-02-28 DIAGNOSIS — E1149 Type 2 diabetes mellitus with other diabetic neurological complication: Secondary | ICD-10-CM | POA: Diagnosis not present

## 2023-02-28 DIAGNOSIS — Z7689 Persons encountering health services in other specified circumstances: Secondary | ICD-10-CM

## 2023-02-28 NOTE — Patient Instructions (Addendum)
I am so proud of you!!!! Keep up the great work!  I have included information on memory changes that you can review.
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
## 2023-03-01 LAB — CBC WITH DIFFERENTIAL/PLATELET
Basophils Absolute: 0 10*3/uL (ref 0.0–0.2)
Basos: 1 %
EOS (ABSOLUTE): 0.2 10*3/uL (ref 0.0–0.4)
Eos: 5 %
Hematocrit: 40.1 % (ref 34.0–46.6)
Hemoglobin: 13.1 g/dL (ref 11.1–15.9)
Immature Grans (Abs): 0 10*3/uL (ref 0.0–0.1)
Immature Granulocytes: 0 %
Lymphocytes Absolute: 2 10*3/uL (ref 0.7–3.1)
Lymphs: 51 %
MCH: 26.1 pg — ABNORMAL LOW (ref 26.6–33.0)
MCHC: 32.7 g/dL (ref 31.5–35.7)
MCV: 80 fL (ref 79–97)
Monocytes Absolute: 0.3 10*3/uL (ref 0.1–0.9)
Monocytes: 8 %
Neutrophils Absolute: 1.4 10*3/uL (ref 1.4–7.0)
Neutrophils: 35 %
Platelets: 279 10*3/uL (ref 150–450)
RBC: 5.01 x10E6/uL (ref 3.77–5.28)
RDW: 14.1 % (ref 11.7–15.4)
WBC: 3.9 10*3/uL (ref 3.4–10.8)

## 2023-03-01 LAB — COMPREHENSIVE METABOLIC PANEL
ALT: 17 IU/L (ref 0–32)
AST: 17 IU/L (ref 0–40)
Albumin/Globulin Ratio: 1.4 (ref 1.2–2.2)
Albumin: 4.2 g/dL (ref 3.8–4.8)
Alkaline Phosphatase: 74 IU/L (ref 44–121)
BUN/Creatinine Ratio: 17 (ref 12–28)
BUN: 11 mg/dL (ref 8–27)
Bilirubin Total: 0.3 mg/dL (ref 0.0–1.2)
CO2: 25 mmol/L (ref 20–29)
Calcium: 9.8 mg/dL (ref 8.7–10.3)
Chloride: 107 mmol/L — ABNORMAL HIGH (ref 96–106)
Creatinine, Ser: 0.64 mg/dL (ref 0.57–1.00)
Globulin, Total: 2.9 g/dL (ref 1.5–4.5)
Glucose: 97 mg/dL (ref 70–99)
Potassium: 4.3 mmol/L (ref 3.5–5.2)
Sodium: 144 mmol/L (ref 134–144)
Total Protein: 7.1 g/dL (ref 6.0–8.5)
eGFR: 94 mL/min/{1.73_m2} (ref >59)

## 2023-03-01 LAB — HEMOGLOBIN A1C
Est. average glucose Bld gHb Est-mCnc: 131 mg/dL
Hgb A1c MFr Bld: 6.2 % — ABNORMAL HIGH (ref 4.8–5.6)

## 2023-03-01 LAB — LIPID PANEL
Chol/HDL Ratio: 2.1 ratio (ref 0.0–4.4)
Cholesterol, Total: 120 mg/dL (ref 100–199)
HDL: 57 mg/dL (ref >39)
LDL Chol Calc (NIH): 49 mg/dL (ref 0–99)
Triglycerides: 69 mg/dL (ref 0–149)
VLDL Cholesterol Cal: 14 mg/dL (ref 5–40)

## 2023-03-01 LAB — VITAMIN B12: Vitamin B-12: 487 pg/mL (ref 232–1245)

## 2023-03-01 LAB — VITAMIN D 25 HYDROXY (VIT D DEFICIENCY, FRACTURES): Vit D, 25-Hydroxy: 40.4 ng/mL (ref 30.0–100.0)

## 2023-03-03 ENCOUNTER — Ambulatory Visit: Payer: Medicare PPO | Admitting: Neurology

## 2023-03-03 ENCOUNTER — Encounter: Payer: Self-pay | Admitting: Neurology

## 2023-03-03 VITALS — BP 104/61 | HR 76 | Ht 61.0 in | Wt 148.4 lb

## 2023-03-03 DIAGNOSIS — R42 Dizziness and giddiness: Secondary | ICD-10-CM | POA: Diagnosis not present
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
## 2023-03-03 NOTE — Patient Instructions (Signed)
Your dizziness is likely a function of not hydrating well enough, medication side effects from the gabapentin, and possible vertigo.  Please try to drink more water, 6 to 8 cups/day I recommended generally speaking.  If you would like to consider a sleep study I would be happy to order one.  Please use your cane for gait safety.

## 2023-03-05 ENCOUNTER — Other Ambulatory Visit: Payer: Self-pay | Admitting: Nurse Practitioner

## 2023-03-05 DIAGNOSIS — Z1231 Encounter for screening mammogram for malignant neoplasm of breast: Secondary | ICD-10-CM

## 2023-03-06 ENCOUNTER — Encounter: Payer: Self-pay | Admitting: Nurse Practitioner

## 2023-03-06 DIAGNOSIS — Z7689 Persons encountering health services in other specified circumstances: Secondary | ICD-10-CM | POA: Insufficient documentation

## 2023-03-06 DIAGNOSIS — H832X3 Labyrinthine dysfunction, bilateral: Secondary | ICD-10-CM | POA: Insufficient documentation

## 2023-03-06 DIAGNOSIS — R42 Dizziness and giddiness: Secondary | ICD-10-CM | POA: Insufficient documentation

## 2023-03-06 NOTE — Assessment & Plan Note (Signed)
Intermittent episodes of hypoglycemia with successful use of CGM. Continue to monitor closely.

## 2023-03-06 NOTE — Assessment & Plan Note (Signed)
>>  ASSESSMENT AND PLAN FOR VERTIGO WRITTEN ON 03/06/2023  8:08 PM BY Ninetta Adelstein E, NP  The patient reports episodes of dizziness and forgetfulness, including leaving the stove on and the door open. These symptoms have prompted concern and a referral to a neurologist for further evaluation. The patient has an upcoming appointment with Dr. Susie Cassette to discuss these symptoms, which may be related to postmenopausal changes affecting the frontal lobe or skull. Plan: - Await evaluation and recommendations from the neurology appointment. - Encourage the use of reminder notes and visual cues to assist with memory.

## 2023-03-06 NOTE — Assessment & Plan Note (Signed)
The patient has made significant progress in weight loss, transitioning from obese to overweight status. The patient's current weight is 146 pounds, with a goal weight of 140 pounds to allow for some flexibility. The patient has been actively participating in a 12-week class and has made dietary adjustments to support weight loss efforts. Despite challenges such as holiday disruptions, the patient remains committed to reaching a healthy weight. Plan: - Continue current dietary and exercise regimen. - Encourage patient to maintain a balanced intake of carbohydrates, especially around physical activity times to support energy levels.

## 2023-03-06 NOTE — Assessment & Plan Note (Signed)
The patient reports episodes of dizziness and forgetfulness, including leaving the stove on and the door open. These symptoms have prompted concern and a referral to a neurologist for further evaluation. The patient has an upcoming appointment with Dr. Susie Cassette to discuss these symptoms, which may be related to postmenopausal changes affecting the frontal lobe or skull. Plan: - Await evaluation and recommendations from the neurology appointment. - Encourage the use of reminder notes and visual cues to assist with memory.

## 2023-03-06 NOTE — Assessment & Plan Note (Signed)
The patient reports fluctuations in blood sugar levels, attributing recent challenges to stress and dietary choices around Easter. Despite these fluctuations, the patient has made significant improvements in blood sugar management through dietary changes and increased physical activity. Plan: - Continue monitoring blood sugar levels closely. - Encourage patient to maintain dietary adjustments and physical activity regimen. - Reinforce the importance of balanced carbohydrate intake, especially in relation to exercise.

## 2023-03-06 NOTE — Assessment & Plan Note (Signed)
Labs planned for today. She is currently having difficulty with repatha injections. Recommend bring these in with due next time for demonstration of use.

## 2023-03-06 NOTE — Assessment & Plan Note (Signed)
Currently well managed with no symptoms. No changes

## 2023-03-06 NOTE — Assessment & Plan Note (Signed)
Blood pressure well managed at this time. No alarming symptoms. Dizziness does not appear to be related to blood pressure. Not currently on medication with good control consistently.  Plan:  - Continue to monitor routinely

## 2023-03-06 NOTE — Assessment & Plan Note (Signed)
Memory changes reported with concerns for simple mistakes. It is unclear if changes are associated with attention/concentration or true memory changes. We discussed ways to help remember with reminders and sticky notes. She will let me know if this is effective.

## 2023-03-10 ENCOUNTER — Encounter: Payer: Self-pay | Admitting: Nurse Practitioner

## 2023-03-10 ENCOUNTER — Ambulatory Visit: Payer: Medicare PPO | Admitting: Neurology

## 2023-03-10 ENCOUNTER — Ambulatory Visit: Payer: Medicare PPO | Admitting: Nurse Practitioner

## 2023-03-10 VITALS — BP 116/74 | HR 79 | Wt 147.4 lb

## 2023-03-10 DIAGNOSIS — N3281 Overactive bladder: Secondary | ICD-10-CM | POA: Diagnosis not present

## 2023-03-10 DIAGNOSIS — R311 Benign essential microscopic hematuria: Secondary | ICD-10-CM | POA: Diagnosis not present

## 2023-03-10 DIAGNOSIS — R82998 Other abnormal findings in urine: Secondary | ICD-10-CM

## 2023-03-10 HISTORY — DX: Other abnormal findings in urine: R82.998

## 2023-03-10 LAB — POCT URINALYSIS DIP (CLINITEK)
Bilirubin, UA: NEGATIVE
Glucose, UA: NEGATIVE mg/dL
Ketones, POC UA: NEGATIVE mg/dL
Nitrite, UA: NEGATIVE
Spec Grav, UA: 1.025 (ref 1.010–1.025)
Urobilinogen, UA: 0.2 E.U./dL
pH, UA: 6 (ref 5.0–8.0)

## 2023-03-10 MED ORDER — SULFAMETHOXAZOLE-TRIMETHOPRIM 800-160 MG PO TABS: 1.0000 | ORAL_TABLET | Freq: Two times a day (BID) | ORAL | 0 refills | Status: DC

## 2023-03-10 NOTE — Progress Notes (Unsigned)
  [name], DNP, AGNP-c [facility] [address] [place], [place] [address] [phone]  Subjective:   Pam [name] is a 72 y.o. female presents to day for evaluation of:  UA shows trace blood, trace leuk, and protein.   PMH, Medications, and Allergies reviewed and updated in chart as appropriate.   ROS negative except for what is listed in HPI. Objective:  BP 116/74   Pulse 79   Wt 147 lb 6.4 oz (66.9 kg)   BMI 27.85 kg/m  Physical Exam        Assessment & Plan:   Problem List Items Addressed This Visit   None     [name], DNP, AGNP-c 03/10/2023  9:25 AM    History, Medications, Surgery, SDOH, and Family History reviewed and updated as appropriate.

## 2023-03-10 NOTE — Patient Instructions (Addendum)
We will go ahead and start antibiotics for suspected UTI.  I would like to recheck the urine in 2 weeks to make sure the blood is gone.   Try using some aquaphor on the vaginal opening to see if this helps with the moisture to prevent friction.   If you decide you would want to try the medication to help with bladder urgency please let me know.

## 2023-03-11 NOTE — Assessment & Plan Note (Signed)
She does have a history of overactive bladder symptoms, which we discussed today. She only wears a pad when she is out of the house at this time. We discussed the option starting oxybutinin to see if this is helpful for her symptoms, but she would like to hold off on this at this time.  Plan: - Please let me know if you have worsening symptoms or would like to start the medication.

## 2023-03-11 NOTE — Assessment & Plan Note (Addendum)
Recent episode of small amount of blood in underwear with a history of UTI's. She is not really having any other symptoms, which is reassuring. Her UA today shows trace blood, protein, and leukocytes. Given her family history of bladder cancer in her mother, I do feel that monitoring is necessary. I do have suspicion of vaginal atrophy as being a contributor to this.  Plan: - I will go ahead and start you on an antibiotic given that there is blood and white blood cells in your urine.  - I would like to recheck the urinalysis in 1-2 weeks to make sure that the blood has resolved.  - If there is still blood present, I would like to do an examination of the vaginal area to ensure that there is not any irritation that could be causing the blood presence.  - Use aquaphor to the vaginal area to help with moisture and prevent friction.

## 2023-03-12 LAB — URINE CULTURE: Organism ID, Bacteria: NO GROWTH

## 2023-03-19 ENCOUNTER — Other Ambulatory Visit (HOSPITAL_BASED_OUTPATIENT_CLINIC_OR_DEPARTMENT_OTHER): Payer: Self-pay | Admitting: Family

## 2023-03-25 ENCOUNTER — Other Ambulatory Visit: Payer: Medicare PPO

## 2023-03-26 ENCOUNTER — Other Ambulatory Visit (INDEPENDENT_AMBULATORY_CARE_PROVIDER_SITE_OTHER): Payer: Medicare PPO

## 2023-03-26 DIAGNOSIS — R82998 Other abnormal findings in urine: Secondary | ICD-10-CM | POA: Diagnosis not present

## 2023-03-26 DIAGNOSIS — R311 Benign essential microscopic hematuria: Secondary | ICD-10-CM | POA: Diagnosis not present

## 2023-03-26 DIAGNOSIS — N3281 Overactive bladder: Secondary | ICD-10-CM

## 2023-03-26 LAB — POCT URINALYSIS DIP (PROADVANTAGE DEVICE)
Bilirubin, UA: NEGATIVE
Glucose, UA: NEGATIVE mg/dL
Ketones, POC UA: NEGATIVE mg/dL
Nitrite, UA: NEGATIVE
Protein Ur, POC: NEGATIVE mg/dL
Specific Gravity, Urine: 1.025
Urobilinogen, Ur: 0.2
pH, UA: 6 (ref 5.0–8.0)

## 2023-03-27 ENCOUNTER — Other Ambulatory Visit: Payer: Self-pay

## 2023-03-27 DIAGNOSIS — R319 Hematuria, unspecified: Secondary | ICD-10-CM

## 2023-03-27 LAB — RENAL FUNCTION PANEL
Albumin: 4.1 g/dL (ref 3.8–4.8)
BUN/Creatinine Ratio: 20 (ref 12–28)
BUN: 13 mg/dL (ref 8–27)
CO2: 22 mmol/L (ref 20–29)
Calcium: 9.3 mg/dL (ref 8.7–10.3)
Chloride: 106 mmol/L (ref 96–106)
Creatinine, Ser: 0.65 mg/dL (ref 0.57–1.00)
Glucose: 90 mg/dL (ref 70–99)
Phosphorus: 3.7 mg/dL (ref 3.0–4.3)
Potassium: 4.3 mmol/L (ref 3.5–5.2)
Sodium: 143 mmol/L (ref 134–144)
eGFR: 93 mL/min/{1.73_m2} (ref >59)

## 2023-04-01 DIAGNOSIS — Z1231 Encounter for screening mammogram for malignant neoplasm of breast: Secondary | ICD-10-CM
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
## 2023-04-09 ENCOUNTER — Encounter: Payer: Self-pay | Admitting: Internal Medicine

## 2023-04-09 ENCOUNTER — Other Ambulatory Visit: Payer: Self-pay

## 2023-04-09 ENCOUNTER — Ambulatory Visit: Payer: Medicare PPO | Admitting: Internal Medicine

## 2023-04-09 VITALS — BP 122/64 | HR 70 | Temp 98.7°F | Wt 150.8 lb

## 2023-04-09 DIAGNOSIS — J4541 Moderate persistent asthma with (acute) exacerbation: Secondary | ICD-10-CM

## 2023-04-09 DIAGNOSIS — J3089 Other allergic rhinitis: Secondary | ICD-10-CM | POA: Diagnosis not present

## 2023-04-09 DIAGNOSIS — J302 Other seasonal allergic rhinitis: Secondary | ICD-10-CM | POA: Diagnosis not present

## 2023-04-09 DIAGNOSIS — R42 Dizziness and giddiness: Secondary | ICD-10-CM

## 2023-04-09 MED ORDER — MONTELUKAST SODIUM 10 MG PO TABS
ORAL_TABLET | ORAL | 3 refills | Status: DC
  Filled 2024-02-18: qty 90, 90d supply, fill #0

## 2023-04-09 MED ORDER — MOMETASONE FUROATE 50 MCG/ACT NA SUSP: 2.0000 | Freq: Every day | NASAL | 5 refills | Status: DC | PRN

## 2023-04-09 MED ORDER — ALBUTEROL SULFATE HFA 108 (90 BASE) MCG/ACT IN AERS: 1.0000 | INHALATION_SPRAY | RESPIRATORY_TRACT | 1 refills | Status: DC | PRN

## 2023-04-09 MED ORDER — SYMBICORT 160-4.5 MCG/ACT IN AERO
2.0000 | INHALATION_SPRAY | Freq: Two times a day (BID) | RESPIRATORY_TRACT | 5 refills | Status: DC
  Filled 2024-02-20: qty 10.2, 30d supply, fill #0

## 2023-04-09 NOTE — Patient Instructions (Addendum)
Mild Persistent Asthma: Restart Symbicort 160 mcg 2 puffs twice a day, with a spacer - use daily during spring and fall.   During winter and summer only use for 2 weeks during respiratory illness if needed. Continue montelukast 10 mg nightly.  - For flare/respiratory illness: add pulmicort (budesonide) 1 vial twice a day for 2 weeks or until your symptoms improve - Rinse mouth out after use - Use Albuterol (Proair/Ventolin) 2 puffs every 4-6 hours as needed for chest tightness, wheezing, or coughing  - Use Albuterol (Proair/Ventolin) 2 puffs 15 minutes prior to exercise/walking stairs/running after puppy if you have symptoms with activity  - Use a spacer with all inhalers - Asthma is not controlled if:  - Symptoms are occurring >2 times a week OR  - >2 times a month nighttime awakenings  - Please call the clinic to schedule a follow up if these symptoms arise  Seasonal and perennial allergic rhinitis: - previous allergy testing positive to grasses, weeds, trees, dust mites, cat, dog, horse, cockroach. - allergen avoidance as below - consider allergy shots as long term control of your symptoms by teaching your immune system to be more tolerant of your allergy triggers Before medicated nasal sprays, use nasal saline spray. - Continue Nasal Steroid Spray: Rhinocort (budesonide) 1- 2 sprays in each nostril daily (can buy over-the-counter if not covered by insurance)  Best results if used daily. - Continue over the counter antihistamine daily or daily as needed.   -Your options include Zyrtec (Cetirizine) 10mg , Claritin (Loratadine) 10mg , Allegra (Fexofenadine) 180mg , or Xyzal (Levocetirinze) 5mg   Vertigo:  - follow-up with ENT - concern for fall risk  Follow-up in 4-6 months, sooner if needed.   It was a pleasure seeing you again in clinic today!  Check out these cookbooks: [name] and [name], [name]   Tonny Bollman, MD [facility]  [place]  ---------------------------------------------------------------- Reducing Pollen Exposure  The American Academy of Allergy, Asthma and Immunology suggests the following steps to reduce your exposure to pollen during allergy seasons.    Do not hang sheets or clothing out to dry; pollen may collect on these items. Do not mow lawns or spend time around freshly cut grass; mowing stirs up pollen. Keep windows closed at night.  Keep car windows closed while driving. Minimize morning activities outdoors, a time when pollen counts are usually at their highest. Stay indoors as much as possible when pollen counts or humidity is high and on windy days when pollen tends to remain in the air longer. Use air conditioning when possible.  Many air conditioners have filters that trap the pollen spores. Use a HEPA room air filter to remove pollen form the indoor air you breathe.  DUST MITE AVOIDANCE MEASURES:  There are three main measures that need and can be taken to avoid house dust mites:  Reduce accumulation of dust in general -reduce furniture, clothing, carpeting, books, stuffed animals, especially in bedroom  Separate yourself from the dust -use pillow and mattress encasements (can be found at stores such as [employer] or online) -avoid direct exposure to air condition flow -use a HEPA filter device, especially in the bedroom; you can also use a HEPA filter vacuum cleaner -wipe dust with a moist towel instead of a dry towel or broom when cleaning  Decrease mites and/or their secretions -wash clothing and linen and stuffed animals at highest temperature possible, at least every 2 weeks -stuffed animals can also  be placed in a bag and put in a freezer overnight  Despite the above measures, it is impossible to eliminate dust mites or their allergen completely from your home.  With the above measures the burden of mites in your home can be diminished, with the goal of minimizing your  allergic symptoms.  Success will be reached only when implementing and using all means together.  Control of Dog or Cat Allergen  Avoidance is the best way to manage a dog or cat allergy. If you have a dog or cat and are allergic to dog or cats, consider removing the dog or cat from the home. If you have a dog or cat but don't want to find it a new home, or if your family wants a pet even though someone in the household is allergic, here are some strategies that may help keep symptoms at bay:  Keep the pet out of your bedroom and restrict it to only a few rooms. Be advised that keeping the dog or cat in only one room will not limit the allergens to that room. Don't pet, hug or kiss the dog or cat; if you do, wash your hands with soap and water. High-efficiency particulate air (HEPA) cleaners run continuously in a bedroom or living room can reduce allergen levels over time. Regular use of a high-efficiency vacuum cleaner or a central vacuum can reduce allergen levels. Giving your dog or cat a bath at least once a week can reduce airborne allergen. Control of Cockroach Allergen  Cockroach allergen has been identified as an important cause of acute attacks of asthma, especially in urban settings.  There are fifty-five species of cockroach that exist in the [place], however only three, the [place], [place] species produce allergen that can affect patients with Asthma.  Allergens can be obtained from fecal particles, egg casings and secretions from cockroaches.    Remove food sources. Reduce access to water. Seal access and entry points. Spray runways with 0.5-1% Diazinon or Chlorpyrifos Blow boric acid power under stoves and refrigerator. Place bait stations (hydramethylnon) at feeding sites.  Control of Mold Allergen   Mold and fungi can grow on a variety of surfaces provided certain temperature and moisture conditions exist.  Outdoor molds grow on plants, decaying vegetation  and soil.  The major outdoor mold, Alternaria and Cladosporium, are found in very high numbers during hot and dry conditions.  Generally, a late Summer - Fall peak is seen for common outdoor fungal spores.  Rain will temporarily lower outdoor mold spore count, but counts rise rapidly when the rainy period ends.  The most important indoor molds are Aspergillus and Penicillium.  Dark, humid and poorly ventilated basements are ideal sites for mold growth.  The next most common sites of mold growth are the bathroom and the kitchen.  Outdoor (Seasonal) Mold Control  Use air conditioning and keep windows closed Avoid exposure to decaying vegetation. Avoid leaf raking. Avoid grain handling. Consider wearing a face mask if working in moldy areas.    Indoor (Perennial) Mold Control   Maintain humidity below 50%. Clean washable surfaces with 5% bleach solution. Remove sources e.g. contaminated carpets.

## 2023-04-11 ENCOUNTER — Other Ambulatory Visit (HOSPITAL_BASED_OUTPATIENT_CLINIC_OR_DEPARTMENT_OTHER): Payer: Self-pay | Admitting: Family

## 2023-04-11 DIAGNOSIS — E785 Hyperlipidemia, unspecified: Secondary | ICD-10-CM

## 2023-04-11 DIAGNOSIS — I25118 Atherosclerotic heart disease of native coronary artery with other forms of angina pectoris: Secondary | ICD-10-CM

## 2023-04-15 ENCOUNTER — Telehealth: Payer: Self-pay

## 2023-04-15 NOTE — Progress Notes (Unsigned)
Patient ID: Pam [name], female   DOB: 07-22-1951, 72 y.o.   MRN: [id number]  Care Management & Coordination Services Pharmacy Team  Reason for Encounter:Chart Prep for initial encounter with Pam [name] on 04/24/23 at 10 am in office.   {US HC Outreach:28874}  Have you seen any other providers since your last visit? **{YES NO:22349} Any changes in your medications or health? {YES NO:22349} Any side effects from any medications? {YES NO:22349} Do you have an symptoms or problems not managed by your medications? {YES NO:22349} Any concerns about your health right now? {YES NO:22349} Has your provider asked that you check blood pressure, blood sugar, or follow special diet at home? {YES NO:22349} Do you get any type of exercise on a regular basis? {YES NO:22349} Can you think of a goal you would like to reach for your health? *** Do you have any problems getting your medications? {YES NO:22349} Is there anything that you would like to discuss during the appointment? ***  Patient assistance for the following mediations  Patient aware to bring blood pressure cuff, medications that do not need refrigeration and supplements to appointment   Chart review:  Recent office visits:  03/10/23 Early, [name], NP - Patient presented for Benign essential microscopic hematuria and other concerns. Prescribed Bactrim  02/28/23 [name], NP - Patient presented for Type 2 diabetes mellitus with neurological complications and other concerns. Stopped Albuterol. Stopped Symbicort  02/04/23 [name], LPN - Patient presented for Medicare Annual Wellness Exam. Patient voiced goal of being at a healthy weight. No medication changes.  10/25/22 [name], NP - Patient presented for type 2 diabetes mellitus with neurological complications and other concerns. Prescribed Ketoconazole. Changed Ozempic. Stopped Lisinopril . Stopped Meclizine. Stopped Meloxicam. Stopped Nystatin.   Recent consult  visits:  04/09/23 [name], MD (Allergy Clinic) - Patient presented for Seasonal and perennial allergic rhinitis and other concerns. Prescribed Budesonide. Stopped Triamcinolone.  03/03/23 [name], MD (Neurology) - Patient presented for Dizziness. No medication changes.  01/08/23 [name], MD (Allergy Clinic) - Patient presented for Seasonal and perennial allergic rhinitis and other concerns. Increased Symbicort  01/03/23 [name], PT - Patient presented for DDD and other concerns. No medication changes.  01/02/23 [name], MD (Cardiology) - Patient presented for Dizziness and other concerns. Stopped Ascorbic Acid. Stopped Benzonatate. Stopped Duloxetine.  12/30/22  [name], RN - Patient presented to [facility] for prep class. No medication changes.  Hospital visits:  Medication Reconciliation was completed by comparing discharge summary, patient's EMR and Pharmacy list, and upon discussion with patient.  Patient presented to [facility] ED at [facility] on 10/22/22 due to Flank pain. Patient was present for 3 hours.  New?Medications Started at [facility] Discharge:?? -started  gabapentin  Medication Changes at Hospital Discharge: -Changed  none  Medications Discontinued at Hospital Discharge: -Stopped  none  Medications that remain the same after Hospital Discharge:??  -All other medications will remain the same.      Patient presented to [facility] at [facility] on 10/22/22 Patient was present for 2 hours.  New?Medications Started at [facility] Discharge:?? -started  none  Medication Changes at Hospital Discharge: -Changed  none  Medications Discontinued at Hospital Discharge: -Stopped  none  Medications that remain the same after Hospital Discharge:??  -All other medications will remain the same.     Patient presented to [facility] ED at [facility] on 10/15/22  due to chest pain and other concerns.  Patient was present for 7 hours.  New?Medications Started at [facility] Discharge:?? none  Medication Changes at Hospital Discharge: -Changed  none  Medications Discontinued at Hospital Discharge: -Stopped  none  Medications that remain the same after Hospital Discharge:??  -All other medications will remain the same.     Fill History : ALBUTEROL HFA 90 MCG INHALER 03/24/2023 30   budesonide 0.5 mg/2 mL suspension for nebulization 09/24/2022 14   SYMBICORT 160-4.5 MCG INHALER 03/21/2023 30   Repatha SureClick 140 mg/mL subcutaneous pen injector 04/14/2023 84   GABAPENTIN 100 MG CAPSULE 10/23/2022 20   KETOCONAZOLE 2% CREAM 03/24/2023 30   LEVOCETIRIZINE 5 MG TABLET 03/01/2023 90   MOMETASONE FUROATE 50 MCG SPRY 03/19/2023 90   montelukast 10 mg tablet 04/09/2023 90   ROSUVASTATIN CALCIUM 10 MG TAB 03/21/2023 90   OZEMPIC 1 MG/DOSE (4 MG/3 ML) 02/17/2023 84   SULFAMETHOXAZOLE-TMP DS TABLET 03/10/2023 5    Star Rating Drugs:  Rosuvastatin 10 mg - [facility] Ozempic 1 mg - Last filled [facility]/17/23 [facility]    Care Gaps: Zoster Vaccine - Overdue TDAP - Overdue PNA Vaccine - Overdue Eye Exam - Overdue Diabetic Urine - Overdue COVID Booster - Overdue Colonoscopy - Overdue A1C - Overdue AWV - 02/04/23     [name] CMA Clinical Pharmacist Assistant [phone]

## 2023-04-16 ENCOUNTER — Encounter: Payer: Self-pay | Admitting: Nurse Practitioner

## 2023-04-16 DIAGNOSIS — R42 Dizziness and giddiness: Secondary | ICD-10-CM | POA: Diagnosis not present

## 2023-04-21 ENCOUNTER — Ambulatory Visit
Admission: RE | Admit: 2023-04-21 | Discharge: 2023-04-21 | Disposition: A | Discharge status: Home / Self Care | Payer: Medicare PPO | Source: Ambulatory Visit | Attending: Nurse Practitioner | Admitting: Nurse Practitioner

## 2023-04-21 DIAGNOSIS — Z1231 Encounter for screening mammogram for malignant neoplasm of breast: Secondary | ICD-10-CM

## 2023-04-21 NOTE — Progress Notes (Unsigned)
Care Management & Coordination Services Pharmacy Note  04/21/2023 Name:  Pam [name] MRN:  [id number] DOB:  1951-03-15  Summary: ***  Recommendations/Changes made from today's visit: ***  Follow up plan: ***   Subjective: Pam [name] is an 72 y.o. year old female who is a primary patient of Early, [name], NP.  The care coordination team was consulted for assistance with disease management and care coordination needs.    {CCMTELEPHONEFACETOFACE:21091510} for initial visit.  Recent office visits: 03/10/23 Early, [name], NP - Patient presented for Benign essential microscopic hematuria and other concerns. Prescribed Bactrim   02/28/23 [name], NP - Patient presented for Type 2 diabetes mellitus with neurological complications and other concerns. Stopped Albuterol. Stopped Symbicort   02/04/23 [name], LPN - Patient presented for Medicare Annual Wellness Exam. Patient voiced goal of being at a healthy weight. No medication changes.   10/25/22 [name], NP - Patient presented for type 2 diabetes mellitus with neurological complications and other concerns. Prescribed Ketoconazole. Changed Ozempic. Stopped Lisinopril . Stopped Meclizine. Stopped Meloxicam. Stopped Nystatin.  Recent consult visits: 04/16/23 Jacklyn Shell, PA (ENT) - For dizziness, exams ordered to assess for BPPV, no med changes.  04/09/23 [name], MD (Allergy Clinic) - Patient presented for Seasonal and perennial allergic rhinitis and other concerns. Prescribed Budesonide. Stopped Triamcinolone.   03/03/23 [name], MD (Neurology) - Patient presented for Dizziness. No medication changes.   01/08/23 [name], MD (Allergy Clinic) - Patient presented for Seasonal and perennial allergic rhinitis and other concerns. Increased Symbicort   01/03/23 [name], PT - Patient presented for DDD and other concerns. No medication changes.   01/02/23 [name], MD (Cardiology)  - Patient presented for Dizziness and other concerns. Stopped Ascorbic Acid. Stopped Benzonatate. Stopped Duloxetine.   12/30/22  [name], RN - Patient presented to [facility] for prep class. No medication changes.  Hospital visits: 10/22/22 [facility] ED at [facility] -  For flank pain, LOS 3 hours. Start gabapentin  10/15/22 [place] ED at [facility] - for chest pain and other concerns, LOS 7 hours, no med changes   Objective:  Lab Results  Component Value Date   CREATININE 0.65 03/26/2023   BUN 13 03/26/2023   EGFR 93 03/26/2023   GFRNONAA >60 10/22/2022   GFRAA 100 12/31/2018   NA 143 03/26/2023   K 4.3 03/26/2023   CALCIUM 9.3 03/26/2023   CO2 22 03/26/2023   GLUCOSE 90 03/26/2023    Lab Results  Component Value Date/Time   HGBA1C 6.2 (H) 02/28/2023 09:28 AM   HGBA1C 6.3 (H) 07/25/2022 02:42 PM   MICROALBUR 80 12/31/2018 11:49 AM    Last diabetic Eye exam:  Lab Results  Component Value Date/Time   HMDIABEYEEXA No Retinopathy 10/13/2018 12:00 AM    Last diabetic Foot exam: No results found for: "HMDIABFOOTEX"   Lab Results  Component Value Date   CHOL 120 02/28/2023   HDL 57 02/28/2023   LDLCALC 49 02/28/2023   TRIG 69 02/28/2023   CHOLHDL 2.1 02/28/2023       Latest Ref Rng & Units 03/26/2023   10:31 AM 02/28/2023    9:28 AM 12/30/2022    9:26 AM  Hepatic Function  Total Protein 6.0 - 8.5 g/dL  7.1  6.9   Albumin 3.8 - 4.8 g/dL 4.1  4.2  4.3   AST 0 - 40 IU/L  17  17   ALT  0 - 32 IU/L  17  15   Alk Phosphatase 44 - 121 IU/L  74  64   Total Bilirubin 0.0 - 1.2 mg/dL  0.3  0.3     Lab Results  Component Value Date/Time   TSH 0.904 08/05/2022 04:09 PM   TSH 1.530 04/03/2022 11:37 AM   TSH 1.400 09/11/2021 11:40 AM   TSH 2.373 02/24/2012 10:00 PM       Latest Ref Rng & Units 02/28/2023    9:28 AM 10/22/2022    5:13 PM 10/15/2022    3:53 PM  CBC  WBC 3.4 - 10.8 x10E3/uL 3.9  4.8  4.5   Hemoglobin 11.1 - 15.9 g/dL 16.1  09.6   04.5   Hematocrit 34.0 - 46.6 % 40.1  39.8  39.1   Platelets 150 - 450 x10E3/uL 279  297  282     Lab Results  Component Value Date/Time   VD25OH 40.4 02/28/2023 09:28 AM   VD25OH 32.9 07/25/2022 02:42 PM   VITAMINB12 487 02/28/2023 09:28 AM   VITAMINB12 364 08/05/2022 04:09 PM    Clinical ASCVD: Yes  The ASCVD Risk score (Arnett DK, et al., 2019) failed to calculate for the following reasons:   The valid total cholesterol range is 130 to 320 mg/dL       02/17/8118   14:78 AM 04/03/2022   10:19 AM 08/10/2021    9:12 AM  Depression screen PHQ 2/9  Decreased Interest 0 1 0  Down, Depressed, Hopeless 0 1 0  PHQ - 2 Score 0 2 0  Altered sleeping  2 2  Tired, decreased energy  2 2  Change in appetite  1 3  Feeling bad or failure about yourself   1 0  Trouble concentrating  1 0  Moving slowly or fidgety/restless  0 0  Suicidal thoughts  0 0  PHQ-9 Score  9 7  Difficult doing work/chores  Somewhat difficult Not difficult at all     Social History   Tobacco Use  Smoking Status Former   Packs/day: 0.50   Years: 20.00   Additional pack years: 0.00   Total pack years: 10.00   Types: Cigarettes   Quit date: 04/06/1997   Years since quitting: 26.0   Passive exposure: Never  Smokeless Tobacco Never   BP Readings from Last 3 Encounters:  04/09/23 122/64  03/10/23 116/74  03/03/23 104/61   Pulse Readings from Last 3 Encounters:  04/09/23 70  03/10/23 79  03/03/23 76   Wt Readings from Last 3 Encounters:  04/09/23 150 lb 12.8 oz (68.4 kg)  03/10/23 147 lb 6.4 oz (66.9 kg)  03/03/23 148 lb 6.4 oz (67.3 kg)   BMI Readings from Last 3 Encounters:  04/09/23 28.49 kg/m  03/10/23 27.85 kg/m  03/03/23 28.04 kg/m    Allergies  Allergen Reactions   Penicillins Other (See Comments)    Diarrhea, rash   Amoxicillin Diarrhea, Rash and Other (See Comments)   Latex Itching and Rash   Multihance [Gadobenate] Nausea And Vomiting    Pt had some nausea and vomiting  immediately after contrast administered.    Medications Reviewed Today     Reviewed by [name], MD (Physician) on 04/09/23 at 1313  Med List Status: <None>   Medication Order Taking? Sig Documenting Provider Last Dose Status Informant  acetaminophen (TYLENOL) 500 MG tablet [id number] Yes Take 500 mg by mouth as needed for mild pain or moderate pain. [provider] Taking Active  Self  albuterol (VENTOLIN HFA) 108 (90 Base) MCG/ACT inhaler [id number] Yes Inhale 1 puff into the lungs every 4 (four) hours as needed. [provider]  Active   aspirin EC 81 MG tablet [id number] Yes Take 1 tablet (81 mg total) by mouth daily. Swallow whole. Glade Lloyd, MD Taking Active   budesonide (PULMICORT) 0.5 MG/2ML nebulizer solution [id number] Yes Take 2 mLs (0.5 mg total) by nebulization 2 (two) times daily. During respiratory flares for at least 2 weeks or until symptoms resolve. [name], MD Taking Active   Continuous Blood Gluc Receiver (FREESTYLE LIBRE 2 READER) DEVI [id number] Yes Use to monitor blood sugar continuously. AM reading goal: 70-110. 2 hours after meal reading goal: less than 180 Early, [name], NP Taking Active Self  Continuous Blood Gluc Sensor (FREESTYLE LIBRE 2 SENSOR) MISC [id number] Yes Attach one sensor to the arm every 14 days. [name], NP Taking Active   cyanocobalamin (VITAMIN B12) 100 MCG tablet [id number] Yes Take 100 mcg by mouth daily. [provider] Taking Active Self  Evolocumab (REPATHA SURECLICK) 140 MG/ML SOAJ [id number] Yes Inject 140 mg into the skin every 14 (fourteen) days. Alver Sorrow, NP Taking Active   gabapentin (NEURONTIN) 100 MG capsule [id number] Yes Take 1 capsule (100 mg total) by mouth 3 (three) times daily for 20 days. Glyn Ade, MD Taking Active   ketoconazole (NIZORAL) 2 % cream [id number] Yes Apply 1 Application topically 2 (two) times daily. To affected areas. [name], NP Taking Active    levocetirizine (XYZAL) 5 MG tablet [id number] Yes Take 1 tablet (5 mg total) by mouth every evening. [name], MD Taking Active   mometasone (NASONEX) 50 MCG/ACT nasal spray [id number] Yes Place 2 sprays into the nose daily as needed (as needed). [name], MD Taking Active   montelukast (SINGULAIR) 10 MG tablet [id number] Yes TAKE 1 TABLET BY MOUTH EVERYDAY AT BEDTIME [name], MD Taking Active   Multiple Vitamin (MULTIVITAMIN WITH MINERALS) TABS tablet [id number] Yes Take 1 tablet by mouth daily. [provider] Taking Active Self  nitroGLYCERIN (NITROSTAT) 0.4 MG SL tablet [id number] Yes Place 1 tablet (0.4 mg total) under the tongue every 5 (five) minutes as needed for chest pain. Glade Lloyd, MD Taking Active   Polyethyl Glycol-Propyl Glycol [facility] FREE OP) [id number] Yes Apply 2 drops to eye daily as needed (both eyes as needed). [provider] Taking Active Self  rosuvastatin (CRESTOR) 10 MG tablet [id number] Yes TAKE 1 TABLET BY MOUTH EVERY DAY [name], MD Taking Active   Semaglutide, 1 MG/DOSE, 4 MG/3ML SOPN [id number] Yes Inject 1 mg as directed once a week. [name], NP Taking Active   sulfamethoxazole-trimethoprim (BACTRIM DS) 800-160 MG tablet [id number] Yes Take 1 tablet by mouth 2 (two) times daily. Use for 5 days. [name], NP Taking Active   triamcinolone cream (KENALOG) 0.1 % [id number] No Apply 1 Application topically 2 (two) times daily. Apply to area of itching and rash in a thin layer.m  Patient not taking: Reported on 04/09/2023   Early, [name], NP Not Taking Active             SDOH:  (Social Determinants of Health) assessments and interventions performed: Yes SDOH Interventions    Flowsheet Row Clinical Support from 02/04/2023 in [facility] Visit from 04/03/2022 in [facility] at [facility]  SDOH Interventions    Food Insecurity Interventions  Intervention Not  Indicated --  Housing Interventions Intervention Not Indicated --  Transportation Interventions Intervention Not Indicated --  Utilities Interventions Intervention Not Indicated --  Alcohol Usage Interventions Intervention Not Indicated (Score <7) --  Depression Interventions/Treatment  -- Medication  Financial Strain Interventions Intervention Not Indicated --  Physical Activity Interventions Intervention Not Indicated --  Stress Interventions Intervention Not Indicated --  Social Connections Interventions Intervention Not Indicated --       Medication Assistance: {MEDASSISTANCEINFO:25044}  Medication Access: Name and location of current pharmacy:  [id number] [place] [place] - [address] [place] [address] Phone: [phone] Fax: [phone]  Within the past 30 days, how often has patient missed a dose of medication? *** Is a pillbox or other method used to improve adherence? {YES/NO:21197} Factors that may affect medication adherence? {CHL DESC; BARRIERS:21522} Are meds synced by current pharmacy? {YES/NO:21197} Are meds delivered by current pharmacy? {YES/NO:21197} Does patient experience delays in picking up medications due to transportation concerns? {YES/NO:21197}  Compliance/Adherence/Medication fill history: Care Gaps: Zoster Vaccine - Overdue TDAP - Overdue PNA Vaccine - Overdue Eye Exam - Overdue Diabetic Urine - Overdue COVID Booster - Overdue Colonoscopy - Overdue A1C - Overdue AWV - 02/04/23  Star-Rating Drugs: Rosuvastatin 10 mg - Last [facility]/23 [facility] Ozempic 1 mg - Last filled [facility]/17/23 [facility]   Assessment/Plan Hypertension (BP goal <130/80) -Controlled -Current treatment: None -Medications previously tried: Lisinopril, Metoprolol  -Current home readings: *** -Current dietary habits: *** -Current exercise habits: *** -{ACTIONS;DENIES/REPORTS:21021675::"Denies"} hypotensive/hypertensive  symptoms -Educated on {CCM BP Counseling:25124} -Counseled to monitor BP at home ***, document, and provide log at future appointments -{CCMPHARMDINTERVENTION:25122}  Query Hyperlipidemia: (LDL goal < 70) -Controlled -Current treatment: Rosuvastatin 10mg  1 qd Repatha every 2 weeks -Medications previously tried: None  -Current dietary patterns: *** -Current exercise habits: *** -Educated on {CCM HLD Counseling:25126} -{CCMPHARMDINTERVENTION:25122}  Diabetes (A1c goal <7%) -Controlled -Current medications: Ozempic 1mg  once weekly -Medications previously tried: Glipizide, Kombiglyze, Janumet -Current home glucose readings fasting glucose: *** post prandial glucose: *** -{ACTIONS;DENIES/REPORTS:21021675::"Denies"} hypoglycemic/hyperglycemic symptoms -Current meal patterns:  breakfast: ***  lunch: ***  dinner: *** snacks: *** drinks: *** -Current exercise: *** -Educated on {CCM DM COUNSELING:25123} -Counseled to check feet daily and get yearly eye exams -{CCMPHARMDINTERVENTION:25122}  Asthma/Allergic Rhinitis (Goal: control symptoms and prevent exacerbations) -{US controlled/uncontrolled:25276} -Current treatment  Albuterol HFA Inhale 1 puff into the lungs every 4 hours prn Pulmicort neb 1 vial every 12 hours BID prn respiratory flares Xyzal 5mg  1 qd Nasonex 2 sprays into nose daily prn Montelukast 10mg  1 qd Symbicort 160 2 puffs BID -Medications previously tried: Flonase  -Pulmonary function testing: None found -Exacerbations requiring treatment in last 6 months: *** -Patient {Actions; denies-reports:120008} consistent use of maintenance inhaler -Frequency of rescue inhaler use: *** -Counseled on {CCMINHALERCOUNSELING:25121} -{CCMPHARMDINTERVENTION:25122}  CAD (Goal: Slow progression of atherosclerosis (plaques / blockages) throughout your body to reduce risk of heart attack and strokes) -Not assessed today Current Medication Therapy: Aspirin 81mg  1  qd Nitroglycerin 0.4mg  SL prn  OTC  -Current treatment  Acetaminophen 500mg  prn Vit B12 tab MVI Systane eye drops prn   Sherrill Raring Clinical Pharmacist [phone]

## 2023-04-24 ENCOUNTER — Ambulatory Visit: Payer: Medicare PPO | Admitting: Neurology

## 2023-04-29 DIAGNOSIS — R42 Dizziness and giddiness: Secondary | ICD-10-CM | POA: Diagnosis not present

## 2023-04-29 DIAGNOSIS — H9041 Sensorineural hearing loss, unilateral, right ear, with unrestricted hearing on the contralateral side: Secondary | ICD-10-CM | POA: Diagnosis not present

## 2023-05-01 ENCOUNTER — Other Ambulatory Visit (HOSPITAL_COMMUNITY): Payer: Self-pay | Admitting: Physician Assistant

## 2023-05-01 DIAGNOSIS — H903 Sensorineural hearing loss, bilateral: Secondary | ICD-10-CM

## 2023-05-19 ENCOUNTER — Ambulatory Visit (HOSPITAL_COMMUNITY)
Admission: RE | Admit: 2023-05-19 | Discharge: 2023-05-19 | Disposition: A | Discharge status: Home / Self Care | Payer: Medicare PPO | Source: Ambulatory Visit | Attending: Physician Assistant | Admitting: Physician Assistant

## 2023-05-19 ENCOUNTER — Encounter (HOSPITAL_COMMUNITY): Payer: Self-pay

## 2023-05-19 DIAGNOSIS — H903 Sensorineural hearing loss, bilateral: Secondary | ICD-10-CM

## 2023-05-20 ENCOUNTER — Ambulatory Visit: Payer: Medicare PPO | Admitting: Nurse Practitioner

## 2023-05-20 ENCOUNTER — Other Ambulatory Visit: Payer: Self-pay | Admitting: Nurse Practitioner

## 2023-05-20 ENCOUNTER — Telehealth: Payer: Self-pay

## 2023-05-20 DIAGNOSIS — Z91041 Radiographic dye allergy status: Secondary | ICD-10-CM

## 2023-05-20 MED ORDER — PREDNISONE 50 MG PO TABS: ORAL_TABLET | ORAL | 0 refills | Status: DC

## 2023-05-20 MED ORDER — DIPHENHYDRAMINE HCL 25 MG PO TABS: ORAL_TABLET | ORAL | 0 refills | Status: DC

## 2023-05-20 NOTE — Progress Notes (Signed)
Notified by MRI that patient has allergy to MRI contrast. Will send in 13 hour steroid prep and diphenhydramine for use prior to procedure.

## 2023-05-20 NOTE — Telephone Encounter (Signed)
Called [name] had to leave a detailed message and told her to call back for the specific instructions on both medications.   When you get a moment today, can you call [name] and let her know that MRI reached out to me about her allergy to contrast. I have sent in a prednisone prep for her to use. She will take 1 prednisone 13 hours before the MRI, another 7 hours before the MRI, and the last 1 hour before the MRI. She will also need to take 50mg  of benadryl 1 hour before the MRI. These should be available at her pharmacy.

## 2023-05-26 DIAGNOSIS — R310 Gross hematuria: Secondary | ICD-10-CM | POA: Diagnosis not present

## 2023-05-26 DIAGNOSIS — R3121 Asymptomatic microscopic hematuria: Secondary | ICD-10-CM | POA: Diagnosis not present

## 2023-05-28 ENCOUNTER — Encounter: Payer: Self-pay | Admitting: Nurse Practitioner

## 2023-06-20 ENCOUNTER — Ambulatory Visit: Payer: Medicare PPO | Admitting: Nurse Practitioner

## 2023-06-29 ENCOUNTER — Other Ambulatory Visit: Payer: Self-pay | Admitting: Nurse Practitioner

## 2023-06-29 ENCOUNTER — Other Ambulatory Visit: Payer: Self-pay | Admitting: Internal Medicine

## 2023-06-29 DIAGNOSIS — R21 Rash and other nonspecific skin eruption: Secondary | ICD-10-CM

## 2023-06-30 NOTE — Telephone Encounter (Signed)
Last apt 03/10/23.

## 2023-07-01 ENCOUNTER — Encounter (HOSPITAL_BASED_OUTPATIENT_CLINIC_OR_DEPARTMENT_OTHER): Payer: Self-pay | Admitting: Family

## 2023-07-01 ENCOUNTER — Ambulatory Visit (HOSPITAL_BASED_OUTPATIENT_CLINIC_OR_DEPARTMENT_OTHER): Payer: Medicare PPO | Admitting: Family

## 2023-07-01 VITALS — BP 122/62 | HR 60 | Ht 61.0 in | Wt 149.0 lb

## 2023-07-01 DIAGNOSIS — I10 Essential (primary) hypertension: Secondary | ICD-10-CM

## 2023-07-01 DIAGNOSIS — E118 Type 2 diabetes mellitus with unspecified complications: Secondary | ICD-10-CM | POA: Diagnosis not present

## 2023-07-01 DIAGNOSIS — I2510 Atherosclerotic heart disease of native coronary artery without angina pectoris: Secondary | ICD-10-CM

## 2023-07-01 DIAGNOSIS — E785 Hyperlipidemia, unspecified: Secondary | ICD-10-CM | POA: Diagnosis not present

## 2023-07-01 NOTE — Progress Notes (Signed)
Cardiology Office Note:  .   Date:  07/01/2023  ID:  Pam [name], DOB 11/26/1950, MRN [id number] PCP: [name], NP  [facility] Providers Cardiologist:  [name], MD    History of Present Illness: Pam [name] is a 72 y.o. female with a hx of CAD, DM 2, COPD, HLD, FM, diabetic neuropathy, fibromyalgia, chronic dizziness.   Longstanding history of dizziness leading to negative MRI of brain December 2022.  Admitted 9/25 - 08/06/2022 with syncope and chest pain.  Syncope more consistent with vertigo or vagal component. ZIO ordered to rule out arrhythmia. CT head negative for acute abnormality.  Troponinx2 negative.  MRI brain negative acute stroke.Echocardiogram 08/06/22 normal LVEF 60 to 65%, grade 1 diastolic dysfunction, RV SF normal, mitral annular calcification without regurgitation.  As troponin negative and chest pain atypical for angina she was recommended for outpatient cardiac CTA. Cardiac CTA 08/26/22 coronary calcium score of 65 placing her in the 82nd percentile for age and gender suggesting high risk for future cardiac events.  Noted moderate stenosis in mid D1 of borderline hemodynamic significance with FFR of 0.76 and nonobstructive disease in the LAD and dominant LCx.  Was recommended for medical management by Dr. Herbie Baltimore given distal location of borderline lesion.   ZIO Monitor 08/2022 preliminary report with predominantly normal sinus rhythm 75 bpm with rare PVC/PAC with <1% burden.  Last seen 01/02/23 with only episodic episodes of dizziness attributed to vertigo as symptoms were not orthostatic.    Presents today for follow up. Feeling well since last seen. Pleasant lady who lives at home with her dog. Enjoys reading and presently following politics closely. 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  She has missed a couple doses of Repatha since last seen. She is tolerating Repatha better in her abdomen than in her leg. Planning to establish with a counselor to  talk about her aging journey. She has had no recurrent falls since last seen. She does have a vestibular evaluation upcoming. Still with dizziness now with some nausea. Occasional lightheadedness with position changes, discussed staying hydrated and making position changes slowly.   ROS: Please see the history of present illness.    All other systems reviewed and are negative.   Studies Reviewed: Marland Kitchen   EKG Interpretation Date/Time:  Tuesday July 01 2023 14:19:47 EDT Ventricular Rate:  60 PR Interval:  132 QRS Duration:  72 QT Interval:  390 QTC Calculation: 390 R Axis:   47  Text Interpretation: Normal sinus rhythm Normal ECG Confirmed by Gillian Shields ([id number]) on 07/01/2023 2:34:42 PM    Cardiac Studies & Procedures       ECHOCARDIOGRAM  ECHOCARDIOGRAM COMPLETE 08/06/2022  Narrative ECHOCARDIOGRAM REPORT    Patient Name:   Pam [name] Date of Exam: 08/06/2022 Medical Rec #:  [id number]     Height:       61.0 in Accession #:    [id number]    Weight:       153.6 lb Date of Birth:  1951-05-07     BSA:          1.688 m Patient Age:    71 years      BP:           159/90 mmHg Patient Gender: F             HR:           57 bpm. Exam Location:  Inpatient  Procedure: 2D Echo and Intracardiac Opacification Agent  Indications:    syncope  History:        Patient has prior history of Echocardiogram examinations, most recent 02/25/2012. Signs/Symptoms:Chest Pain and Syncope; Risk Factors:Diabetes.  Sonographer:    Cathie Hoops Referring Phys: [id number] PING T Chipper Herb   Sonographer Comments: Technically difficult study due to poor echo windows and patient is obese. Image acquisition challenging due to patient body habitus. IMPRESSIONS   1. Left ventricular ejection fraction, by estimation, is 60 to 65%. The left ventricle has normal function. The left ventricle has no regional wall motion abnormalities. There is mild concentric left ventricular hypertrophy. Left ventricular  diastolic parameters are consistent with Grade I diastolic dysfunction (impaired relaxation). 2. Right ventricular systolic function is normal. The right ventricular size is normal. There is normal pulmonary artery systolic pressure. 3. The mitral valve is abnormal- Caseous mitral annular calcification; this may be a benign finding. No evidence of mitral valve regurgitation. No evidence of mitral stenosis. Severe mitral annular calcification. 4. The aortic valve was not well visualized. Aortic valve regurgitation is not visualized.  Comparison(s): No significant change from prior study. Unable to access 2013 study.  FINDINGS Left Ventricle: Left ventricular ejection fraction, by estimation, is 60 to 65%. The left ventricle has normal function. The left ventricle has no regional wall motion abnormalities. Definity contrast agent was given IV to delineate the left ventricular endocardial borders. The left ventricular internal cavity size was normal in size. There is mild concentric left ventricular hypertrophy. Left ventricular diastolic parameters are consistent with Grade I diastolic dysfunction (impaired relaxation).  Right Ventricle: The right ventricular size is normal. No increase in right ventricular wall thickness. Right ventricular systolic function is normal. There is normal pulmonary artery systolic pressure. The tricuspid regurgitant velocity is 2.10 m/s, and with an assumed right atrial pressure of 3 mmHg, the estimated right ventricular systolic pressure is 20.6 mmHg.  Left [facility]: Left atrial size was normal in size.  Right [facility]: Right atrial size was normal in size.  Pericardium: There is no evidence of pericardial effusion.  Mitral Valve: The mitral valve is abnormal. Severe mitral annular calcification. No evidence of mitral valve regurgitation. No evidence of mitral valve stenosis.  Tricuspid Valve: The tricuspid valve is normal in structure. Tricuspid valve regurgitation is  not demonstrated. No evidence of tricuspid stenosis.  Aortic Valve: The aortic valve was not well visualized. Aortic valve regurgitation is not visualized. Aortic valve mean gradient measures 3.0 mmHg. Aortic valve peak gradient measures 6.0 mmHg. Aortic valve area, by VTI measures 1.60 cm.  Pulmonic Valve: The pulmonic valve was normal in structure. Pulmonic valve regurgitation is not visualized. No evidence of pulmonic stenosis.  Aorta: The aortic root and ascending aorta are structurally normal, with no evidence of dilitation.  IAS/Shunts: No atrial level shunt detected by color flow Doppler.   LEFT VENTRICLE PLAX 2D LVIDd:         3.90 cm     Diastology LVIDs:         2.60 cm     LV e' medial:    6.74 cm/s LV PW:         1.10 cm     LV E/e' medial:  10.4 LV IVS:        1.20 cm     LV e' lateral:   11.20 cm/s LVOT diam:     1.60 cm     LV E/e' lateral: 6.3 LV SV:         44 LV SV  Index:   26 LVOT Area:     2.01 cm  LV Volumes (MOD) LV vol d, MOD A2C: 41.6 ml LV vol d, MOD A4C: 52.9 ml LV vol s, MOD A2C: 17.1 ml LV vol s, MOD A4C: 19.5 ml LV SV MOD A2C:     24.5 ml LV SV MOD A4C:     52.9 ml LV SV MOD BP:      28.5 ml  RIGHT VENTRICLE RV Basal diam:  3.00 cm RV Mid diam:    2.90 cm RV S prime:     10.90 cm/s TAPSE (M-mode): 2.1 cm  LEFT ATRIUM             Index        RIGHT ATRIUM          Index LA diam:        3.30 cm 1.95 cm/m   RA Area:     8.68 cm LA Vol (A2C):   32.1 ml 19.01 ml/m  RA Volume:   15.90 ml 9.42 ml/m LA Vol (A4C):   25.9 ml 15.34 ml/m LA Biplane Vol: 29.0 ml 17.18 ml/m AORTIC VALVE                    PULMONIC VALVE AV Area (Vmax):    1.59 cm     PV Vmax:       0.83 m/s AV Area (Vmean):   1.50 cm     PV Peak grad:  2.8 mmHg AV Area (VTI):     1.60 cm AV Vmax:           122.00 cm/s AV Vmean:          80.700 cm/s AV VTI:            0.274 m AV Peak Grad:      6.0 mmHg AV Mean Grad:      3.0 mmHg LVOT Vmax:         96.40 cm/s LVOT Vmean:         60.300 cm/s LVOT VTI:          0.218 m LVOT/AV VTI ratio: 0.80  AORTA Ao Root diam: 2.70 cm Ao Asc diam:  2.80 cm  MITRAL VALVE               TRICUSPID VALVE MV Area (PHT): 2.95 cm    TR Peak grad:   17.6 mmHg MV Decel Time: 257 msec    TR Vmax:        210.00 cm/s MV E velocity: 70.20 cm/s MV A velocity: 95.90 cm/s  SHUNTS MV E/A ratio:  0.73        Systemic VTI:  0.22 m Systemic Diam: 1.60 cm  Riley Lam MD Electronically signed by Riley Lam MD Signature Date/Time: 08/06/2022/12:10:52 PM    Final    MONITORS  LONG TERM MONITOR-LIVE TELEMETRY (3-14 DAYS) 09/12/2022  Narrative   Zio Patch (2 sepatate patches - total ~16 d) Oct 1-17, 2023   Predominant Underlying Rhythm: Sinus Rhythm. HR range 49-134 bpm. Avg 77 bpm   Rare isolated PVCs with no couplets/triplets or bigeminy/trigeminy   Rare isolated PACs with some couplets and triplets but   1 Atrial Run of 8 beats (4.3 sec), HR range 75-130 bpm & avg 107 bpm (not patient triggered)   No Sustained Arrhythmias: Atrial Tachycardia (AT), Supraventricular Tachycardia (SVT), Atrial Fibrillation (A-Fib), Atrial Flutter (A-Flutter), Sustained Ventricular Tachycardia (VT)   There were 94 patient triggered  events, the overwhelming majority of which were sinus rhythm with heart rate range between 60 and 110 bpm.  On rare occasions there were associated PVCs.  Pretty benign study.  No significant findings.  Nothing that would explain symptoms let alone syncope or near syncope.  Bryan Lemma, MD   CT SCANS  CT CORONARY MORPH W/CTA COR W/SCORE 08/26/2022  Addendum 08/26/2022  3:27 PM ADDENDUM REPORT: 08/26/2022 15:25  EXAM: OVER-READ INTERPRETATION  CT CHEST  The following report is an over-read performed by radiologist Dr. Marinda Elk [facility] on 08/26/2022. This over-read does not include interpretation of cardiac or coronary anatomy or pathology. The coronary CTA interpretation  by the cardiologist is attached.  COMPARISON:  CT of the chest on 12/02/2016  FINDINGS: No significant noncardiac vascular findings. Visualized mediastinum and hilar regions demonstrate no lymphadenopathy or masses. Visualized lungs show no evidence of pulmonary edema, consolidation, pneumothorax, nodule or pleural fluid. Visualized bony structures and upper abdomen are unremarkable.  IMPRESSION: No significant extracardiac findings.   Electronically Signed By: Irish Lack M.D. On: 08/26/2022 15:25  Narrative CLINICAL DATA:  Chest pain  EXAM: Cardiac CTA  MEDICATIONS: Sub lingual nitro. 4mg  x 2  TECHNIQUE: The patient was scanned on a Siemens 192 slice scanner. Gantry rotation speed was 250 msecs. Collimation was 0.6 mm. A 100 kV prospective scan was triggered in the ascending thoracic aorta at 35-75% of the R-R interval. Average HR during the scan was 60 bpm. The 3D data set was interpreted on a dedicated work station using MPR, MIP and VRT modes. A total of 80cc of contrast was used.  FINDINGS: Non-cardiac: See separate report from [facility].  Pulmonary veins drain normally to the left atrium. No LA appendage thrombus.  Calcium Score: 65 Agatston units.  Coronary Arteries: Left dominant with no anomalies  LM: No plaque or stenosis.  LAD system: Calcified plaque proximal LAD with mild (1-24%) stenosis. Mixed plaque proximal D1, mild (25-49%) stenosis. Noncalcified plaque mid D1, moderate (50-69%) stenosis. FFR 0.76 (borderline significance) in the distal D1.  Circumflex system: Large vessel provides left PDA. Noncalcified plaque proximal LCx, mild (25-49%) stenosis. FFR 0.85 in the mid LCx.  RCA system: Small, nondominant RCA with no plaque or stenosis.  IMPRESSION: 1. Coronary artery calcium score 65 Agatston units. This places the patient in the 82nd percentile for age and gender, suggesting high risk for future cardiac events.  2.  Moderate stenosis mid D1 of borderline hemodynamic significance (FFR 0.76).  3.  Nonobstructive disease in the LAD and the dominant LCx.  [employer] account executive  Electronically Signed: By: Marca Ancona M.D. On: 08/26/2022 13:09          Risk Assessment/Calculations:             Physical Exam:   VS:  BP 122/62   Pulse 60   Ht 5\' 1"  (1.549 m)   Wt 149 lb (67.6 kg)   BMI 28.15 kg/m    Wt Readings from Last 3 Encounters:  07/01/23 149 lb (67.6 kg)  04/09/23 150 lb 12.8 oz (68.4 kg)  03/10/23 147 lb 6.4 oz (66.9 kg)    GEN: Well nourished, well developed in no acute distress NECK: No JVD; No carotid bruits CARDIAC: RRR, no murmurs, rubs, gallops RESPIRATORY:  Clear to auscultation without rales, wheezing or rhonchi  ABDOMEN: Soft, non-tender, non-distended EXTREMITIES:  No edema; No deformity   ASSESSMENT AND PLAN: .    CAD/HLD, LDL goal less than 70 - 08/26/22  calcium score 65 with moderate stenosis in mid D1 of borderline hemodynamic significance (FFR 0.76), nonobstructive disease in LAD and LCx. No anginal symptoms. Recommended by Dr. Herbie Baltimore for medical management. GDMT Aspirin, Rosuvastatin, Repatha. 02/2023 LDL 49. Continue Repatha and Rosuvastatin 5mg  three times per week. She is presently splitting 10mg  in half and will let us know when she is close to running out and we will send in 5mg  tablet at that time.    Vertigo - upcoming visit in [place] with [facility] for vestibular evaluation. Following with [facility]. Dizziness now accompanied with nausea. Notes if test inconclusive plan is for MRI. Offered referral for vestibular physical therapy, she will complete evaluation in [place] and let us know if she wants referral to [facility] vestibular therapy   HTN - BP well controlled. Continue current antihypertensive regimen.     DM2 / Diabetic neuropathy - Continue to follow with PCP.        Dispo: follow up in 6 months with Dr. Duke Salvia  Signed, Alver Sorrow, NP

## 2023-07-01 NOTE — Patient Instructions (Addendum)
Medication Instructions:  Continue your current medications.   When you are close to running out of your Rosuvastatin let us know and we will send in the lower strength 5mg  tablet.  *If you need a refill on your cardiac medications before your next appointment, please call your pharmacy*  Lab Work: Cholesterol labs in April looked great!  Testing/Procedures: Your EKG today looks great!  Follow-Up: At [facility], you and your health needs are our priority.  As part of our continuing mission to provide you with exceptional heart care, we have created designated Provider Care Teams.  These Care Teams include your primary Cardiologist (physician) and Advanced Practice Providers (APPs -  Physician Assistants and Nurse Practitioners) who all work together to provide you with the care you need, when you need it.  We recommend signing up for the patient portal called "MyChart".  Sign up information is provided on this After Visit Summary.  MyChart is used to connect with patients for Virtual Visits (Telemedicine).  Patients are able to view lab/test results, encounter notes, upcoming appointments, etc.  Non-urgent messages can be sent to your provider as well.   To learn more about what you can do with MyChart, go to [email or web address].    Your next appointment:   6 month(s)  Provider:   [name], MD or Gillian Shields, NP    Other Instructions   [facility] [address] [place],  [place]  [address] Main: [phone] Will deliver for free, just let them know you want it mailed  Let us know if you want a referral to local vestibular therapy.   Youtube exercise: Fabulous 50s ________________________________________  Exercises to do While Sitting  Warm-up Before starting other exercises: Sit up as straight as you can. Have your knees bent at 90 degrees, which is the shape of the capital letter "L." Keep your feet flat on the  floor. Sit at the front edge of your chair, if you can. Pull in (tighten) the muscles in your abdomen and stretch your spine and neck as straight as you can. Hold this position for a few minutes. Breathe in and out evenly. Try to concentrate on your breathing, and relax your mind.  Stretching Exercise A: Arm stretch Hold your arms out straight in front of your body. Bend your hands at the wrist with your fingers pointing up, as if signaling someone to stop. Notice the slight tension in your forearms as you hold the position. Keeping your arms out and your hands bent, rotate your hands outward as far as you can and hold this stretch. Aim to have your thumbs pointing up and your pinkie fingers pointing down. Slowly repeat arm stretches for one minute as tolerated. Exercise B: Leg stretch If you can move your legs, try to "draw" letters on the floor with the toes of your foot. Write your name with one foot. Write your name with the toes of your other foot. Slowly repeat the movements for one minute as tolerated. Exercise C: Reach for the sky Reach your hands as far over your head as you can to stretch your spine. Move your hands and arms as if you are climbing a rope. Slowly repeat the movements for one minute as tolerated.  Range of motion exercises Exercise A: Shoulder roll Let your arms hang loosely at your sides. Lift just your shoulders up toward your ears, then let them relax back down. When your shoulders feel loose, rotate your  shoulders in backward and forward circles. Do shoulder rolls slowly for one minute as tolerated. Exercise B: March in place As if you are marching, pump your arms and lift your legs up and down. Lift your knees as high as you can. If you are unable to lift your knees, just pump your arms and move your ankles and feet up and down. March in place for one minute as tolerated. Exercise C: Seated jumping jacks Let your arms hang down straight. Keeping your arms  straight, lift them up over your head. Aim to point your fingers to the ceiling. While you lift your arms, straighten your legs and slide your heels along the floor to your sides, as wide as you can. As you bring your arms back down to your sides, slide your legs back together. If you are unable to use your legs, just move your arms. Slowly repeat seated jumping jacks for one minute as tolerated.  Strengthening exercises Exercise A: Shoulder squeeze Hold your arms straight out from your body to your sides, with your elbows bent and your fists pointed at the ceiling. Keeping your arms in the bent position, move them forward so your elbows and forearms meet in front of your face. Open your arms back out as wide as you can with your elbows still bent, until you feel your shoulder blades squeezing together. Hold for 5 seconds. Slowly repeat the movements forward and backward for one minute as tolerated.

## 2023-07-02 ENCOUNTER — Encounter: Payer: Self-pay | Admitting: Nurse Practitioner

## 2023-07-02 ENCOUNTER — Ambulatory Visit: Payer: Medicare PPO | Admitting: Nurse Practitioner

## 2023-07-02 VITALS — BP 138/76 | HR 87 | Wt 153.0 lb

## 2023-07-02 DIAGNOSIS — I152 Hypertension secondary to endocrine disorders: Secondary | ICD-10-CM

## 2023-07-02 DIAGNOSIS — E1169 Type 2 diabetes mellitus with other specified complication: Secondary | ICD-10-CM

## 2023-07-02 DIAGNOSIS — R42 Dizziness and giddiness: Secondary | ICD-10-CM | POA: Diagnosis not present

## 2023-07-02 DIAGNOSIS — E1159 Type 2 diabetes mellitus with other circulatory complications: Secondary | ICD-10-CM | POA: Diagnosis not present

## 2023-07-02 DIAGNOSIS — R151 Fecal smearing: Secondary | ICD-10-CM | POA: Diagnosis not present

## 2023-07-02 DIAGNOSIS — I10 Essential (primary) hypertension: Secondary | ICD-10-CM

## 2023-07-02 DIAGNOSIS — E785 Hyperlipidemia, unspecified: Secondary | ICD-10-CM

## 2023-07-02 DIAGNOSIS — R413 Other amnesia: Secondary | ICD-10-CM | POA: Diagnosis not present

## 2023-07-02 DIAGNOSIS — E1149 Type 2 diabetes mellitus with other diabetic neurological complication: Secondary | ICD-10-CM

## 2023-07-02 NOTE — Progress Notes (Signed)
Pam Clamp, DNP, AGNP-c [facility]  [address] [place], [place] [address] [phone]  ESTABLISHED PATIENT- Chronic Health and/or Follow-Up Visit  Blood pressure 138/76, pulse 87, weight 153 lb (69.4 kg).    Pam [name] is a 72 y.o. year old female presenting today for evaluation and management of chronic conditions.   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   She asks about concerns with dizziness with the use of Ozempic and is not sure if this is expected. She tells me that she does feel nausea with the dizziness at this time. This all started with the testing in the ENT office. She tells me since that time she has also noticed an increased in her dizziness. She is going for vestibular testing in [facility]. She has concerns with transportation to this. She has lost her transportation paperwork.   She tells me she is walking with her cane again due to the dizziness. She did stop taking the gabapentin due to concerns that this was causing the dizziness. Since stopping she tells me she is in pain and her dizziness is not better. She would like to restart this .  She tells me she has been having some concerns with forgetfulness and is concerned with her memory. She tells me she is losing things frequently and forgetting more often that she feels she should at her age. She tells me that the changes she is seeing are starting to bother her.   She tells me when she passes gas she is now passing stool with it. She [place] unable to tell that she is passing the stool. This has been present for several months.   All ROS negative with exception of what is listed above.   PHYSICAL EXAM Physical Exam Vitals and nursing note reviewed.  Constitutional:      Appearance: Normal appearance.  HENT:     Head: Normocephalic.  Eyes:     Pupils: Pupils are equal, round, and reactive to light.  Cardiovascular:     Rate and Rhythm: Normal rate and  regular rhythm.     Pulses: Normal pulses.     Heart sounds: Normal heart sounds.  Pulmonary:     Effort: Pulmonary effort is normal.     Breath sounds: Normal breath sounds.  Musculoskeletal:        General: Normal range of motion.     Cervical back: Normal range of motion.  Skin:    General: Skin is warm and dry.  Neurological:     Mental Status: She is alert and oriented to person, place, and time.  Psychiatric:        Mood and Affect: Mood normal.     PLAN Problem List Items Addressed This Visit     Memory loss    Concerns are present with memory changes.  She reports instances of losing items or forgetting to shut and lock the door, etc.  She tells me that although the symptoms are mild at this time she would like to make preparations in the event that they do become severe as she does live alone and does not have any children.  Will send referral to neuropsychiatry for further evaluation and recommendations.      Type 2 diabetes mellitus with neurological complications (HCC)    Chronic and complicated with hypertension and hyperlipidemia.  She is currently managing her blood sugars with Ozempic.  She has had some recent dizziness and nausea.  It  is not completely clear if the dizziness from the vertigo is triggering the nausea or if nausea is coming from the medication.  Given the improvement of her blood sugars on the medication I recommend we continue to monitor and complete the testing for vestibular concerns before considering stopping the medication.  Repeat labs today.      Relevant Orders   Hemoglobin A1c (Completed)   CBC with Differential/Platelet (Completed)   TSH (Completed)   Microalbumin/Creatinine Ratio, Urine (Completed)   Hyperlipidemia associated with type 2 diabetes mellitus (HCC)    Chronic hyperlipidemia in the setting of diabetes and hypertension.  She is currently managed with rosuvastatin 10 mg and Repatha.  No alarm symptoms are present at this time.   Recommend continuation of current treatment and close follow-up.      Hypertension complicating diabetes (HCC)    Chronic.  Blood pressures are not well-controlled today.  The patient is experiencing significant vestibular symptoms which are likely causing elevation in her blood pressure readings.  She is not currently on medication for blood pressure control due to a history of hypotension.  I recommend close monitoring of blood pressures at home and report if these are consistently higher than 130/85 and we will plan to restart medication.  Otherwise we will continue to monitor.      Vertigo    Ongoing dizziness consistent with vertigo.  She does have an appointment set up in [facility] for further evaluation.  She expresses concerns today about the ability to get there.  We have completed paperwork in the past 2 help with assistance for transportation, but she has lost this.  We will be happy to fill this out for her again if she would like to bring it in.      Relevant Orders   CBC with Differential/Platelet (Completed)   Comprehensive metabolic panel (Completed)   TSH (Completed)   Fecal smearing    She reports concerns with fecal smearing without awareness.  Neurologically she appears intact at this time.  There is no clear cause of this however given the urinary incontinence that she experiences this could be a result of weak pelvic floor muscles.  Will start with referral to GI for further evaluation and consider pelvic floor physical therapy if they are in agreement to this.      Relevant Orders   Ambulatory referral to Gastroenterology   Other Visit Diagnoses     Memory changes    -  Primary   Relevant Orders   Ambulatory referral to Neuropsychology   Comprehensive metabolic panel (Completed)   TSH (Completed)       Return in about 3 months (around 10/02/2023) for Med Management 30.  Time: 38 minutes, >50% spent counseling, care coordination, chart review, and  documentation.   Pam Clamp, DNP, AGNP-c

## 2023-07-02 NOTE — Patient Instructions (Signed)
I have sent in the order for neuropsychiatry for evaluation of memory.    Restart the gabapentin at bedtime at 300mg  for one week then increase to 600mg  at bedtime.  You can take the gabapentin 100-300 mg during the day only if needed.

## 2023-07-03 DIAGNOSIS — K760 Fatty (change of) liver, not elsewhere classified: Secondary | ICD-10-CM | POA: Diagnosis not present

## 2023-07-03 DIAGNOSIS — R310 Gross hematuria: Secondary | ICD-10-CM | POA: Diagnosis not present

## 2023-07-03 LAB — CBC WITH DIFFERENTIAL/PLATELET
Basophils Absolute: 0 10*3/uL (ref 0.0–0.2)
Basos: 1 %
EOS (ABSOLUTE): 0.2 10*3/uL (ref 0.0–0.4)
Eos: 3 %
Hematocrit: 41.3 % (ref 34.0–46.6)
Hemoglobin: 12.9 g/dL (ref 11.1–15.9)
Immature Grans (Abs): 0 10*3/uL (ref 0.0–0.1)
Immature Granulocytes: 0 %
Lymphocytes Absolute: 1.9 10*3/uL (ref 0.7–3.1)
Lymphs: 40 %
MCH: 25.3 pg — ABNORMAL LOW (ref 26.6–33.0)
MCHC: 31.2 g/dL — ABNORMAL LOW (ref 31.5–35.7)
MCV: 81 fL (ref 79–97)
Monocytes Absolute: 0.4 10*3/uL (ref 0.1–0.9)
Monocytes: 8 %
Neutrophils Absolute: 2.3 10*3/uL (ref 1.4–7.0)
Neutrophils: 48 %
Platelets: 266 10*3/uL (ref 150–450)
RBC: 5.09 x10E6/uL (ref 3.77–5.28)
RDW: 13.8 % (ref 11.7–15.4)
WBC: 4.8 10*3/uL (ref 3.4–10.8)

## 2023-07-03 LAB — COMPREHENSIVE METABOLIC PANEL
ALT: 11 IU/L (ref 0–32)
AST: 18 IU/L (ref 0–40)
Albumin: 4.3 g/dL (ref 3.8–4.8)
Alkaline Phosphatase: 73 IU/L (ref 44–121)
BUN/Creatinine Ratio: 19 (ref 12–28)
BUN: 12 mg/dL (ref 8–27)
Bilirubin Total: <0.2 mg/dL (ref 0.0–1.2)
CO2: 27 mmol/L (ref 20–29)
Calcium: 9.9 mg/dL (ref 8.7–10.3)
Chloride: 106 mmol/L (ref 96–106)
Creatinine, Ser: 0.64 mg/dL (ref 0.57–1.00)
Globulin, Total: 2.7 g/dL (ref 1.5–4.5)
Glucose: 88 mg/dL (ref 70–99)
Potassium: 4.4 mmol/L (ref 3.5–5.2)
Sodium: 146 mmol/L — ABNORMAL HIGH (ref 134–144)
Total Protein: 7 g/dL (ref 6.0–8.5)
eGFR: 94 mL/min/{1.73_m2} (ref >59)

## 2023-07-03 LAB — HEMOGLOBIN A1C
Est. average glucose Bld gHb Est-mCnc: 131 mg/dL
Hgb A1c MFr Bld: 6.2 % — ABNORMAL HIGH (ref 4.8–5.6)

## 2023-07-03 LAB — TSH: TSH: 0.993 u[IU]/mL (ref 0.450–4.500)

## 2023-07-03 LAB — MICROALBUMIN / CREATININE URINE RATIO
Creatinine, Urine: 157.7 mg/dL
Microalb/Creat Ratio: 6 mg/g{creat} (ref 0–29)
Microalbumin, Urine: 8.9 ug/mL

## 2023-07-04 ENCOUNTER — Encounter (HOSPITAL_BASED_OUTPATIENT_CLINIC_OR_DEPARTMENT_OTHER): Payer: Self-pay | Admitting: Family

## 2023-07-09 DIAGNOSIS — R151 Fecal smearing: Secondary | ICD-10-CM | POA: Insufficient documentation

## 2023-07-09 NOTE — Assessment & Plan Note (Signed)
>>  ASSESSMENT AND PLAN FOR VERTIGO WRITTEN ON 07/09/2023  8:27 PM BY Rotunda Worden E, NP  Ongoing dizziness consistent with vertigo.  She does have an appointment set up in [facility] for further evaluation.  She expresses concerns today about the ability to get there.  We have completed paperwork in the past 2 help with assistance for transportation, but she has lost this.  We will be happy to fill this out for her again if she would like to bring it in.

## 2023-07-09 NOTE — Assessment & Plan Note (Signed)
She reports concerns with fecal smearing without awareness.  Neurologically she appears intact at this time.  There is no clear cause of this however given the urinary incontinence that she experiences this could be a result of weak pelvic floor muscles.  Will start with referral to GI for further evaluation and consider pelvic floor physical therapy if they are in agreement to this.

## 2023-07-09 NOTE — Assessment & Plan Note (Signed)
Concerns are present with memory changes.  She reports instances of losing items or forgetting to shut and lock the door, etc.  She tells me that although the symptoms are mild at this time she would like to make preparations in the event that they do become severe as she does live alone and does not have any children.  Will send referral to neuropsychiatry for further evaluation and recommendations.

## 2023-07-09 NOTE — Assessment & Plan Note (Signed)
Chronic and complicated with hypertension and hyperlipidemia.  She is currently managing her blood sugars with Ozempic.  She has had some recent dizziness and nausea.  It is not completely clear if the dizziness from the vertigo is triggering the nausea or if nausea is coming from the medication.  Given the improvement of her blood sugars on the medication I recommend we continue to monitor and complete the testing for vestibular concerns before considering stopping the medication.  Repeat labs today.

## 2023-07-09 NOTE — Assessment & Plan Note (Signed)
Chronic hyperlipidemia in the setting of diabetes and hypertension.  She is currently managed with rosuvastatin 10 mg and Repatha.  No alarm symptoms are present at this time.  Recommend continuation of current treatment and close follow-up.

## 2023-07-09 NOTE — Assessment & Plan Note (Signed)
Ongoing dizziness consistent with vertigo.  She does have an appointment set up in [facility] for further evaluation.  She expresses concerns today about the ability to get there.  We have completed paperwork in the past 2 help with assistance for transportation, but she has lost this.  We will be happy to fill this out for her again if she would like to bring it in.

## 2023-07-09 NOTE — Assessment & Plan Note (Signed)
Chronic.  Blood pressures are not well-controlled today.  The patient is experiencing significant vestibular symptoms which are likely causing elevation in her blood pressure readings.  She is not currently on medication for blood pressure control due to a history of hypotension.  I recommend close monitoring of blood pressures at home and report if these are consistently higher than 130/85 and we will plan to restart medication.  Otherwise we will continue to monitor.

## 2023-07-17 DIAGNOSIS — R310 Gross hematuria: Secondary | ICD-10-CM | POA: Diagnosis not present

## 2023-07-23 ENCOUNTER — Encounter: Payer: Self-pay | Admitting: Nurse Practitioner

## 2023-07-31 DIAGNOSIS — D582 Other hemoglobinopathies: Secondary | ICD-10-CM | POA: Insufficient documentation

## 2023-08-07 DIAGNOSIS — R42 Dizziness and giddiness: Secondary | ICD-10-CM | POA: Diagnosis not present

## 2023-08-07 DIAGNOSIS — R2689 Other abnormalities of gait and mobility: Secondary | ICD-10-CM | POA: Diagnosis not present

## 2023-08-07 DIAGNOSIS — Z79899 Other long term (current) drug therapy: Secondary | ICD-10-CM | POA: Diagnosis not present

## 2023-08-07 DIAGNOSIS — Z9181 History of falling: Secondary | ICD-10-CM | POA: Diagnosis not present

## 2023-08-07 DIAGNOSIS — H532 Diplopia: Secondary | ICD-10-CM | POA: Diagnosis not present

## 2023-08-13 ENCOUNTER — Other Ambulatory Visit: Payer: Self-pay | Admitting: Internal Medicine

## 2023-08-14 ENCOUNTER — Encounter: Payer: Self-pay | Admitting: Nurse Practitioner

## 2023-08-14 ENCOUNTER — Telehealth: Payer: Medicare PPO | Admitting: Nurse Practitioner

## 2023-08-14 VITALS — Wt 153.0 lb

## 2023-08-14 DIAGNOSIS — G4733 Obstructive sleep apnea (adult) (pediatric): Secondary | ICD-10-CM | POA: Diagnosis not present

## 2023-08-14 DIAGNOSIS — D582 Other hemoglobinopathies: Secondary | ICD-10-CM

## 2023-08-14 DIAGNOSIS — K760 Fatty (change of) liver, not elsewhere classified: Secondary | ICD-10-CM

## 2023-08-14 DIAGNOSIS — M3320 Polymyositis, organ involvement unspecified: Secondary | ICD-10-CM | POA: Diagnosis not present

## 2023-08-14 DIAGNOSIS — H832X3 Labyrinthine dysfunction, bilateral: Secondary | ICD-10-CM

## 2023-08-14 DIAGNOSIS — R413 Other amnesia: Secondary | ICD-10-CM

## 2023-08-14 DIAGNOSIS — M797 Fibromyalgia: Secondary | ICD-10-CM | POA: Diagnosis not present

## 2023-08-14 DIAGNOSIS — R151 Fecal smearing: Secondary | ICD-10-CM

## 2023-08-14 DIAGNOSIS — I25118 Atherosclerotic heart disease of native coronary artery with other forms of angina pectoris: Secondary | ICD-10-CM

## 2023-08-14 DIAGNOSIS — R4589 Other symptoms and signs involving emotional state: Secondary | ICD-10-CM

## 2023-08-20 ENCOUNTER — Encounter: Payer: Self-pay | Admitting: Nurse Practitioner

## 2023-08-20 DIAGNOSIS — K760 Fatty (change of) liver, not elsewhere classified: Secondary | ICD-10-CM | POA: Insufficient documentation

## 2023-08-20 DIAGNOSIS — R4589 Other symptoms and signs involving emotional state: Secondary | ICD-10-CM | POA: Insufficient documentation

## 2023-08-20 NOTE — Assessment & Plan Note (Signed)
Patient reports dizziness and has been informed of a possible growth in the ear. MRI has been ordered to rule out retrocochlear pathology. -Will monitor for results to be sent to me -If MRI is negative, consider referral for vestibular physical therapy.

## 2023-08-20 NOTE — Assessment & Plan Note (Signed)
Patient reports episodes of passing stool unknowingly. -Needs further evaluation and management. I strongly recommend follow-up with GI and neurology.

## 2023-08-20 NOTE — Assessment & Plan Note (Signed)
Patient reports forgetfulness, which may be related to sleep disturbances and stress. -Monitor memory issues and reassess after sleep and stress have been addressed.

## 2023-08-20 NOTE — Assessment & Plan Note (Signed)
Patient reports significant worry about health conditions, contributing to sleep disturbances and possibly memory issues. -Consider strategies for stress management and coping with health-related anxiety.

## 2023-08-20 NOTE — Assessment & Plan Note (Signed)
Patient has been managing weight and diet, with good liver function on recent blood work. -Continue current lifestyle modifications including weight management and healthy diet.

## 2023-08-20 NOTE — Progress Notes (Signed)
Virtual Visit Encounter mychart visit.   I connected with  [name] on 08/20/23 at  3:30 PM EDT by secure video and audio telemedicine application. I verified that I am speaking with the correct person using two identifiers.   I introduced myself as a Publishing rights manager with the practice. The limitations of evaluation and management by telemedicine discussed with the patient and the availability of in person appointments. The patient expressed verbal understanding and consent to proceed.  Participating parties in this visit include: Myself and patient  The patient is: Patient Location: Home I am: Provider Location: Office/Clinic Subjective:    CC and HPI: Pam [name] is a 72 y.o. year old female presenting for multiple concerns.  History of Present Illness Dr. Miquel Dunn presents with multiple concerns today.    The primary issue is sleep disturbances, characterized by difficulty falling asleep and waking up due to tremors. These tremors, which have been present for a couple of years, occur in multiples of three or four as the patient is transitioning between sleep and wakefulness. The patient also reports instances of head shaking while sitting.   The patient has been experiencing significant anxiety due to multiple health concerns. She was recently evaluated for vestibular dysfunction, with a potential growth in the ear suggested as a worst-case scenario. This has led to increased worry and stress, contributing to the sleep disturbances. She reports ruminating over the idea of a possible tumor.     The patient is actively seeking a therapist to help manage this anxiety, preferring cognitive therapy over medication.  The patient also reports episodes of incontinence, characterized by passing gas and stool unexpectedly, even during sleep. This has occurred multiple times, including an incident where the patient was unaware until finding stool on their robe. The patient denies consuming  dairy, which she initially thought might be a contributing factor. She denies known back injury or paresthesia's in her legs.   The patient has also noticed an increase in forgetfulness, misplacing items such as her license, insurance, registration, and money. This forgetfulness has been causing additional stress and worry, particularly as the patient does not recall her mother or grandmother experiencing similar issues at her age.  The patient has a history of non-alcoholic fatty liver disease, which she had forgotten about until recently. She has made significant lifestyle changes, including dietary modifications and weight loss, to manage this condition. However, she expresses concern about the long-term implications of this disease and what else she needs to do to keep it under control.  The patient also mentions a previous diagnosis of fibromyalgia and restless leg syndrome, which was identified during a sleep apnea test a few years ago. The patient is considering another sleep apnea test due to her current sleep disturbances.  The patient has been self-isolating, which a neighbor noticed and expressed concern about. The patient acknowledges this and attributes it partly to her dizziness, which has been preventing her from leaving the house. She expresses a desire to address these issues and improve her overall health.  Past medical history, Surgical history, Family history not pertinant except as noted below, Social history, Allergies, and medications have been entered into the medical record, reviewed, and corrections made.   Review of Systems:  All review of systems negative except what is listed in the HPI  Objective:    Alert and oriented x 4 Speaking in clear sentences with no shortness of breath. No distress.  Impression and Recommendations:    Problem List Items  Addressed This Visit     Memory loss - Primary    Patient reports forgetfulness, which may be related to sleep  disturbances and stress. -Monitor memory issues and reassess after sleep and stress have been addressed.      Fecal smearing    Patient reports episodes of passing stool unknowingly. -Needs further evaluation and management. I strongly recommend follow-up with GI and neurology.       Vestibular hypofunction, bilateral    Patient reports dizziness and has been informed of a possible growth in the ear. MRI has been ordered to rule out retrocochlear pathology. -Will monitor for results to be sent to me -If MRI is negative, consider referral for vestibular physical therapy.      Anxiety about health    Patient reports significant worry about health conditions, contributing to sleep disturbances and possibly memory issues. -Consider strategies for stress management and coping with health-related anxiety.      NAFLD (nonalcoholic fatty liver disease)    Patient has been managing weight and diet, with good liver function on recent blood work. -Continue current lifestyle modifications including weight management and healthy diet.      Fibromyalgia   Obstructive sleep apnea    orders and follow up as documented in EMR I discussed the assessment and treatment plan with the patient. The patient was provided an opportunity to ask questions and all were answered. The patient agreed with the plan and demonstrated an understanding of the instructions.   The patient was advised to call back or seek an in-person evaluation if the symptoms worsen or if the condition fails to improve as anticipated.  Follow-Up: in 3 months  I provided 52 minutes of non-face-to-face interaction with this non face-to-face encounter including intake, same-day documentation, and chart review.   [name], NP , DNP, AGNP-c [facility]

## 2023-08-23 ENCOUNTER — Ambulatory Visit (HOSPITAL_COMMUNITY)
Admission: RE | Admit: 2023-08-23 | Discharge: 2023-08-23 | Disposition: A | Discharge status: Home / Self Care | Payer: Medicare PPO | Source: Ambulatory Visit | Attending: Physician Assistant | Admitting: Physician Assistant

## 2023-08-23 DIAGNOSIS — H903 Sensorineural hearing loss, bilateral: Secondary | ICD-10-CM | POA: Diagnosis not present

## 2023-08-23 DIAGNOSIS — I6782 Cerebral ischemia: Secondary | ICD-10-CM | POA: Diagnosis not present

## 2023-08-23 DIAGNOSIS — H905 Unspecified sensorineural hearing loss: Secondary | ICD-10-CM | POA: Diagnosis not present

## 2023-08-23 MED ORDER — GADOBUTROL 1 MMOL/ML IV SOLN
6.0000 mL | Freq: Once | INTRAVENOUS | Status: AC | PRN
  Administered 2023-08-23: 6 mL via INTRAVENOUS

## 2023-08-25 ENCOUNTER — Emergency Department (HOSPITAL_BASED_OUTPATIENT_CLINIC_OR_DEPARTMENT_OTHER): Payer: Medicare PPO

## 2023-08-25 ENCOUNTER — Other Ambulatory Visit: Payer: Self-pay

## 2023-08-25 ENCOUNTER — Emergency Department (HOSPITAL_BASED_OUTPATIENT_CLINIC_OR_DEPARTMENT_OTHER): Payer: Medicare PPO | Admitting: Radiology

## 2023-08-25 ENCOUNTER — Encounter (HOSPITAL_BASED_OUTPATIENT_CLINIC_OR_DEPARTMENT_OTHER): Payer: Self-pay

## 2023-08-25 ENCOUNTER — Emergency Department (HOSPITAL_BASED_OUTPATIENT_CLINIC_OR_DEPARTMENT_OTHER)
Admission: EM | Admit: 2023-08-25 | Discharge: 2023-08-25 | Disposition: A | Discharge status: Home / Self Care | Payer: Medicare PPO | Attending: Emergency Medicine | Admitting: Emergency Medicine

## 2023-08-25 DIAGNOSIS — R079 Chest pain, unspecified: Secondary | ICD-10-CM | POA: Diagnosis not present

## 2023-08-25 DIAGNOSIS — R0602 Shortness of breath: Secondary | ICD-10-CM | POA: Diagnosis not present

## 2023-08-25 DIAGNOSIS — I10 Essential (primary) hypertension: Secondary | ICD-10-CM | POA: Diagnosis not present

## 2023-08-25 DIAGNOSIS — Z91040 Latex allergy status: Secondary | ICD-10-CM | POA: Insufficient documentation

## 2023-08-25 DIAGNOSIS — Z7982 Long term (current) use of aspirin: Secondary | ICD-10-CM | POA: Insufficient documentation

## 2023-08-25 DIAGNOSIS — Z9049 Acquired absence of other specified parts of digestive tract: Secondary | ICD-10-CM | POA: Diagnosis not present

## 2023-08-25 DIAGNOSIS — R0789 Other chest pain: Secondary | ICD-10-CM | POA: Diagnosis not present

## 2023-08-25 DIAGNOSIS — Z1152 Encounter for screening for COVID-19: Secondary | ICD-10-CM | POA: Diagnosis not present

## 2023-08-25 DIAGNOSIS — R110 Nausea: Secondary | ICD-10-CM | POA: Diagnosis not present

## 2023-08-25 DIAGNOSIS — R519 Headache, unspecified: Secondary | ICD-10-CM | POA: Diagnosis not present

## 2023-08-25 DIAGNOSIS — N3289 Other specified disorders of bladder: Secondary | ICD-10-CM | POA: Diagnosis not present

## 2023-08-25 DIAGNOSIS — J9811 Atelectasis: Secondary | ICD-10-CM | POA: Diagnosis not present

## 2023-08-25 DIAGNOSIS — R202 Paresthesia of skin: Secondary | ICD-10-CM | POA: Diagnosis not present

## 2023-08-25 DIAGNOSIS — E119 Type 2 diabetes mellitus without complications: Secondary | ICD-10-CM | POA: Insufficient documentation

## 2023-08-25 DIAGNOSIS — R42 Dizziness and giddiness: Secondary | ICD-10-CM | POA: Diagnosis not present

## 2023-08-25 LAB — RESP PANEL BY RT-PCR (RSV, FLU A&B, COVID)  RVPGX2
Influenza A by PCR: NEGATIVE
Influenza B by PCR: NEGATIVE
Resp Syncytial Virus by PCR: NEGATIVE
SARS Coronavirus 2 by RT PCR: NEGATIVE

## 2023-08-25 LAB — HEPATIC FUNCTION PANEL
ALT: 9 U/L (ref 0–44)
AST: 14 U/L — ABNORMAL LOW (ref 15–41)
Albumin: 4 g/dL (ref 3.5–5.0)
Alkaline Phosphatase: 47 U/L (ref 38–126)
Bilirubin, Direct: 0.1 mg/dL (ref 0.0–0.2)
Indirect Bilirubin: 0.3 mg/dL (ref 0.3–0.9)
Total Bilirubin: 0.4 mg/dL (ref 0.3–1.2)
Total Protein: 7.1 g/dL (ref 6.5–8.1)

## 2023-08-25 LAB — BASIC METABOLIC PANEL
Anion gap: 8 (ref 5–15)
BUN: 12 mg/dL (ref 8–23)
CO2: 28 mmol/L (ref 22–32)
Calcium: 9.4 mg/dL (ref 8.9–10.3)
Chloride: 106 mmol/L (ref 98–111)
Creatinine, Ser: 0.61 mg/dL (ref 0.44–1.00)
GFR, Estimated: >60 mL/min (ref >60)
Glucose, Bld: 93 mg/dL (ref 70–99)
Potassium: 3.7 mmol/L (ref 3.5–5.1)
Sodium: 142 mmol/L (ref 135–145)

## 2023-08-25 LAB — CBC
HCT: 38.5 % (ref 36.0–46.0)
Hemoglobin: 13 g/dL (ref 12.0–15.0)
MCH: 26.7 pg (ref 26.0–34.0)
MCHC: 33.8 g/dL (ref 30.0–36.0)
MCV: 79.1 fL — ABNORMAL LOW (ref 80.0–100.0)
Platelets: 271 10*3/uL (ref 150–400)
RBC: 4.87 MIL/uL (ref 3.87–5.11)
RDW: 13.6 % (ref 11.5–15.5)
WBC: 3.9 10*3/uL — ABNORMAL LOW (ref 4.0–10.5)
nRBC: 0 % (ref 0.0–0.2)

## 2023-08-25 LAB — TROPONIN I (HIGH SENSITIVITY)
Troponin I (High Sensitivity): 4 ng/L (ref <18)
Troponin I (High Sensitivity): 4 ng/L (ref <18)

## 2023-08-25 MED ORDER — DIPHENHYDRAMINE HCL 50 MG/ML IJ SOLN
50.0000 mg | Freq: Once | INTRAMUSCULAR | Status: AC
  Filled 2023-08-25: qty 1

## 2023-08-25 MED ORDER — DIPHENHYDRAMINE HCL 25 MG PO CAPS
50.0000 mg | ORAL_CAPSULE | Freq: Once | ORAL | Status: AC
  Administered 2023-08-25: 50 mg via ORAL
  Filled 2023-08-25: qty 2

## 2023-08-25 MED ORDER — METOCLOPRAMIDE HCL 5 MG/ML IJ SOLN
10.0000 mg | Freq: Once | INTRAMUSCULAR | Status: AC
  Administered 2023-08-25: 10 mg via INTRAVENOUS
  Filled 2023-08-25: qty 2

## 2023-08-25 MED ORDER — METHYLPREDNISOLONE SODIUM SUCC 40 MG IJ SOLR
40.0000 mg | Freq: Once | INTRAMUSCULAR | Status: AC
  Administered 2023-08-25: 40 mg via INTRAVENOUS
  Filled 2023-08-25: qty 1

## 2023-08-25 MED ORDER — IOHEXOL 350 MG/ML SOLN
100.0000 mL | Freq: Once | INTRAVENOUS | Status: AC | PRN
  Administered 2023-08-25: 100 mL via INTRAVENOUS

## 2023-08-25 MED ORDER — DIPHENHYDRAMINE HCL 50 MG/ML IJ SOLN
12.5000 mg | Freq: Once | INTRAMUSCULAR | Status: AC
  Administered 2023-08-25: 12.5 mg via INTRAVENOUS
  Filled 2023-08-25: qty 1

## 2023-08-25 MED ORDER — MORPHINE SULFATE (PF) 4 MG/ML IV SOLN
4.0000 mg | Freq: Once | INTRAVENOUS | Status: AC
  Administered 2023-08-25: 4 mg via INTRAVENOUS
  Filled 2023-08-25: qty 1

## 2023-08-25 NOTE — ED Triage Notes (Signed)
Woke up with side of face burning "pins and needles" right side face pain.  Then started having chest pain at about 0900.  Central chest and radiated to right shoulder

## 2023-08-25 NOTE — ED Notes (Signed)
Reviewed AVS with patient, patient expressed understanding of directions, denies further questions at this time. 

## 2023-08-25 NOTE — ED Provider Notes (Signed)
  Physical Exam  BP 116/60   Pulse 83   Temp 98.6 F (37 C) (Oral)   Resp 18   Ht 5\' 1"  (1.549 m)   Wt 70 kg   SpO2 95%   BMI 29.16 kg/m   Physical Exam  Procedures  Procedures  ED Course / MDM    Medical Decision Making Amount and/or Complexity of Data Reviewed Labs: ordered. Radiology: ordered.  Risk Prescription drug management.   Received patient signout.  Chest pain and some paresthesias.  CT angiography done and reassuring.  Patient is relieved.  Appears stable for discharge home with outpatient follow-up.       Benjiman Core, MD 08/25/23 [id number]

## 2023-08-25 NOTE — ED Notes (Signed)
Patient transported to X-ray 

## 2023-08-25 NOTE — ED Provider Notes (Signed)
[place] [facility] AT [facility] Provider Note   CSN: [id number] Arrival date & time: 08/25/23  1107     History  Chief Complaint  Patient presents with   Chest Pain    Pam [name] is a 72 y.o. female.   Chest Pain  72 year old female with medical history significant for DM2, fibromyalgia, anxiety, depression, OSA, GERD, HTN, HLD presenting to the emergency department with acute onset chest discomfort.  The patient states that she woke up this morning and developed a burning sensation in the right side of her face with associated pins and needle sensation, no facial droop or numbness.  Symptoms have since resolved.  She then developed chest pressure, described as migratory about the right side of her chest, radiating up to her neck and subsequently down to her abdomen to her right hip.  She endorses nausea, denies any diaphoresis, endorses mild baseline shortness of breath.  No cough, fever or chills.  Pain continues to migrate from her central chest now to her right shoulder and is ongoing.  She does endorse some radiation to her back.  It is not ripping or tearing.  She endorses some continued radiation up her right neck.   Home Medications Prior to Admission medications   Medication Sig Start Date End Date Taking? Authorizing Provider  acetaminophen (TYLENOL) 500 MG tablet Take 500 mg by mouth as needed for mild pain or moderate pain.    [provider]  albuterol (VENTOLIN HFA) 108 (90 Base) MCG/ACT inhaler INHALE 1 PUFF INTO THE LUNGS EVERY 4 HOURS AS NEEDED. 06/30/23   [name], MD  aspirin EC 81 MG tablet Take 1 tablet (81 mg total) by mouth daily. Swallow whole. 08/07/22   Glade Lloyd, MD  budesonide (PULMICORT) 0.5 MG/2ML nebulizer solution Take 2 mLs (0.5 mg total) by nebulization 2 (two) times daily. During respiratory flares for at least 2 weeks or until symptoms resolve. 09/24/22   [name], MD  Evolocumab (REPATHA SURECLICK)  140 MG/ML SOAJ INJECT 140 MG INTO THE SKIN EVERY 14 (FOURTEEN) DAYS. 04/14/23   Alver Sorrow, NP  gabapentin (NEURONTIN) 600 MG tablet Take 600 mg by mouth 3 (three) times daily.    [provider]  ketoconazole (NIZORAL) 2 % cream APPLY 1 APPLICATION TOPICALLY 2 (TWO) TIMES DAILY. TO AFFECTED AREAS. 07/03/23   Early, [name], NP  levocetirizine (XYZAL) 5 MG tablet TAKE 1 TABLET BY MOUTH EVERY DAY IN THE EVENING 08/13/23   [name], MD  mometasone (NASONEX) 50 MCG/ACT nasal spray Place 2 sprays into the nose daily as needed (as needed). 04/09/23   [name], MD  montelukast (SINGULAIR) 10 MG tablet TAKE 1 TABLET BY MOUTH EVERYDAY AT BEDTIME 04/09/23   [name], MD  Multiple Vitamin (MULTIVITAMIN WITH MINERALS) TABS tablet Take 1 tablet by mouth daily.    [provider]  nitroGLYCERIN (NITROSTAT) 0.4 MG SL tablet Place 1 tablet (0.4 mg total) under the tongue every 5 (five) minutes as needed for chest pain. 08/06/22   Glade Lloyd, MD  ondansetron (ZOFRAN-ODT) 4 MG disintegrating tablet Take 4 mg by mouth every 8 (eight) hours as needed. 06/25/23   [provider]  Polyethyl Glycol-Propyl Glycol (SYSTANE FREE OP) Apply 2 drops to eye daily as needed (both eyes as needed).    [provider]  rosuvastatin (CRESTOR) 10 MG tablet Take 5 mg by mouth. Every Monday, Wednesday, & Friday    [provider]  Semaglutide, 1 MG/DOSE, 4 MG/3ML SOPN Inject 1 mg as directed once a week. 10/25/22   [name], NP  SYMBICORT 160-4.5 MCG/ACT inhaler Inhale 2 puffs into the lungs in the morning and at bedtime. 04/09/23   [name], MD      Allergies    Ivp dye [iodinated contrast media], Penicillins, Amoxicillin, Latex, and Multihance [gadobenate]    Review of Systems   Review of Systems  Cardiovascular:  Positive for chest pain.  Musculoskeletal:  Positive for arthralgias and neck pain.  All other systems reviewed and are negative.   Physical  Exam Updated Vital Signs BP (!) 164/68   Pulse (!) 55   Temp 98.6 F (37 C) (Oral)   Resp (!) 21   Ht 5\' 1"  (1.549 m)   Wt 70 kg   SpO2 99%   BMI 29.16 kg/m  Physical Exam Vitals and nursing note reviewed.  Constitutional:      General: She is not in acute distress.    Appearance: She is well-developed.  HENT:     Head: Normocephalic and atraumatic.  Eyes:     Conjunctiva/sclera: Conjunctivae normal.  Cardiovascular:     Rate and Rhythm: Normal rate and regular rhythm.     Pulses: Normal pulses.     Comments: 2+ radial pulses bilaterally Pulmonary:     Effort: Pulmonary effort is normal. No respiratory distress.     Breath sounds: Normal breath sounds.  Chest:     Comments: Right sided chest wall TTP Abdominal:     Palpations: Abdomen is soft.     Tenderness: There is no abdominal tenderness.  Musculoskeletal:        General: No swelling.     Cervical back: Neck supple.  Skin:    General: Skin is warm and dry.     Capillary Refill: Capillary refill takes less than 2 seconds.  Neurological:     General: No focal deficit present.     Mental Status: She is alert and oriented to person, place, and time.     Cranial Nerves: No cranial nerve deficit.     Motor: No weakness.  Psychiatric:        Mood and Affect: Mood normal.     ED Results / Procedures / Treatments   Labs (all labs ordered are listed, but only abnormal results are displayed) Labs Reviewed  CBC - Abnormal; Notable for the following components:      Result Value   WBC 3.9 (*)    MCV 79.1 (*)    All other components within normal limits  RESP PANEL BY RT-PCR (RSV, FLU A&B, COVID)  [id number]  BASIC METABOLIC PANEL  TROPONIN I (HIGH SENSITIVITY)  TROPONIN I (HIGH SENSITIVITY)    EKG EKG Interpretation Date/Time:  Monday August 25 2023 11:22:23 EDT Ventricular Rate:  61 PR Interval:  112 QRS Duration:  91 QT Interval:  409 QTC Calculation: 412 R Axis:   34  Text Interpretation: Sinus rhythm  Borderline short PR interval Confirmed by Ernie Avena ([id number]) on 08/25/2023 12:39:00 PM  Radiology No results found.  Procedures Procedures    Medications Ordered in ED Medications  methylPREDNISolone sodium succinate (SOLU-MEDROL) 40 mg/mL injection 40 mg (has no administration in time range)  diphenhydrAMINE (BENADRYL) capsule 50 mg (has no administration in time range)    Or  diphenhydrAMINE (BENADRYL) injection 50 mg (has no administration in time range)  morphine (PF) 4 MG/ML injection 4 mg (4 mg Intravenous Given 08/25/23  1356)  metoCLOPramide (REGLAN) injection 10 mg (10 mg Intravenous Given 08/25/23 1359)  diphenhydrAMINE (BENADRYL) injection 12.5 mg (12.5 mg Intravenous Given 08/25/23 1353)    ED Course/ Medical Decision Making/ A&P                                 Medical Decision Making Amount and/or Complexity of Data Reviewed Labs: ordered. Radiology: ordered.  Risk Prescription drug management.   72 year old female with medical history significant for DM2, fibromyalgia, anxiety, depression, OSA, GERD, HTN, HLD presenting to the emergency department with acute onset chest discomfort.  The patient states that she woke up this morning and developed a burning sensation in the right side of her face with associated pins and needle sensation, no facial droop or numbness.  Symptoms have since resolved.  She then developed chest pressure, described as migratory about the right side of her chest, radiating up to her neck and subsequently down to her abdomen to her right hip.  She endorses nausea, denies any diaphoresis, endorses mild baseline shortness of breath.  No cough, fever or chills.  Pain continues to migrate from her central chest now to her right shoulder and is ongoing.  She does endorse some radiation to her back.  It is not ripping or tearing.  She endorses some continued radiation up her right neck.   On arrival, the patient was afebrile, not tachycardic or  tachypneic, initially normotensive BP 130/74 subsequently became hypertensive BP 164/68, saturating her percent on room air.  Physical exam revealed clear lungs to auscultation, chest wall tenderness present, intact symmetric 2+ radial pulses, no lower extremity swelling.  Differential diagnosis includes aortic dissection considered, less likely vertebral dissection, considered GERD, less likely ACS, PE, pneumonia, COVID-19.  Initial EKG revealed sinus rhythm, ventricular rate 61, no acute ischemic changes noted.  Chest x-ray, CTA dissection protocol as well as head and neck ordered for further evaluation.  The patient does have a contrast allergy and will need to be premedicated.  Laboratory evaluation revealed COVID-19 influenza PCR testing negative, BMP unremarkable, CBC with a leukopenia to 3.9, initial troponin 4, repeat troponin pending.  The patient was administered Benadryl for her headache as well as morphine for her chest pain.  Plan at time of signout to follow-up results of CT imaging, reassess the patient pending results, ultimate disposition pending results of testing.  Signout given to Dr. Rubin Payor at 1500.   Final Clinical Impression(s) / ED Diagnoses Final diagnoses:  Chest pain, unspecified type    Rx / DC Orders ED Discharge Orders     None         Ernie Avena, MD 08/25/23 1426

## 2023-08-25 NOTE — ED Notes (Signed)
Patient transported to CT 

## 2023-08-26 ENCOUNTER — Encounter (HOSPITAL_BASED_OUTPATIENT_CLINIC_OR_DEPARTMENT_OTHER): Payer: Self-pay

## 2023-09-02 ENCOUNTER — Encounter (HOSPITAL_BASED_OUTPATIENT_CLINIC_OR_DEPARTMENT_OTHER): Payer: Self-pay | Admitting: Family

## 2023-09-02 ENCOUNTER — Ambulatory Visit (HOSPITAL_BASED_OUTPATIENT_CLINIC_OR_DEPARTMENT_OTHER): Payer: Medicare PPO | Admitting: Family

## 2023-09-02 VITALS — BP 128/56 | HR 78 | Resp 16 | Ht 61.0 in | Wt 151.5 lb

## 2023-09-02 DIAGNOSIS — E118 Type 2 diabetes mellitus with unspecified complications: Secondary | ICD-10-CM | POA: Diagnosis not present

## 2023-09-02 DIAGNOSIS — I25118 Atherosclerotic heart disease of native coronary artery with other forms of angina pectoris: Secondary | ICD-10-CM | POA: Diagnosis not present

## 2023-09-02 DIAGNOSIS — E785 Hyperlipidemia, unspecified: Secondary | ICD-10-CM | POA: Diagnosis not present

## 2023-09-02 DIAGNOSIS — I10 Essential (primary) hypertension: Secondary | ICD-10-CM | POA: Diagnosis not present

## 2023-09-02 MED ORDER — ROSUVASTATIN CALCIUM 5 MG PO TABS
5.0000 mg | ORAL_TABLET | ORAL | 3 refills | Status: DC
  Filled 2024-01-27 – 2024-07-05 (×2): qty 36, 84d supply, fill #0

## 2023-09-02 NOTE — Progress Notes (Signed)
Cardiology Office Note:  .   Date:  09/02/2023  ID:  Pam [name], DOB Mar 01, 1951, MRN [id number] PCP: [name], NP  [facility] Providers Cardiologist:  [name], MD    History of Present Illness: Pam [name] is a 71 y.o. female  with a hx of CAD, DM 2, COPD, HLD, FM, diabetic neuropathy, fibromyalgia, chronic dizziness.   Longstanding history of dizziness leading to negative MRI of brain December 2022.  Admitted 9/25 - 08/06/2022 with syncope and chest pain.  Syncope more consistent with vertigo or vagal component. ZIO ordered to rule out arrhythmia. CT head negative for acute abnormality.  Troponinx2 negative.  MRI brain negative acute stroke.Echocardiogram 08/06/22 normal LVEF 60 to 65%, grade 1 diastolic dysfunction, RV SF normal, mitral annular calcification without regurgitation.  As troponin negative and chest pain atypical for angina she was recommended for outpatient cardiac CTA. Cardiac CTA 08/26/22 coronary calcium score of 65 placing her in the 82nd percentile for age and gender suggesting high risk for future cardiac events.  Noted moderate stenosis in mid D1 of borderline hemodynamic significance with FFR of 0.76 and nonobstructive disease in the LAD and dominant LCx.  Was recommended for medical management by Dr. Herbie Baltimore given distal location of borderline lesion.   ZIO Monitor 08/2022 preliminary report with predominantly normal sinus rhythm 75 bpm with rare PVC/PAC with <1% burden.   Sen 01/02/23 with only episodic episodes of dizziness attributed to vertigo as symptoms were not orthostatic.   ED visit 08/25/2023 with unremarkable blood work including troponin, EKG, CT chest, CXR. ED notes state "she woke up this morning and developed burning sensation in right side of her face" then developed right chest pressure which radiated to her neck, abdomen, and right hip.  Last seen 07/01/23 doing well from cardiac perspective with no changes made.    Presents  today for follow up. FPleasant lady who lives at home with her dog. Enjoys reading and presently following politics closely. 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 When asked to describe her ED visit had discomfort from crown of her head, down her right face, and then pain down her right side to her hip all the way down her calf. She does have concomitant costochondritis and fibromyalgia. Chief complaint today dizziness and headaches. 08/25/23 CT angio head/neck with no acute intracranial process, no large vessel occlusion nor stenosis.  MRI brain 08/23/23 with mild chronic small vessel ischemic changes.   Reports having difficulties with Repatha. She took it Saturday but has not been taking routinely. Previously doing in her leg but now doing in her stomach which is less painful but still difficult. Crestor at higher doses caused more myalgias. We discussed leaving Repatha out of the fridge for longer prior to injection to see if injections were more tolerable.    ROS: Please see the history of present illness.    All other systems reviewed and are negative.   Studies Reviewed: .        Cardiac Studies & Procedures       ECHOCARDIOGRAM  ECHOCARDIOGRAM COMPLETE 08/06/2022  Narrative ECHOCARDIOGRAM REPORT    Patient Name:   Pam [name] Date of Exam: 08/06/2022 Medical Rec #:  [id number]     Height:       61.0 in Accession #:    [id number]    Weight:       153.6 lb Date of Birth:  Jan 04, 1951     BSA:  1.688 m Patient Age:    71 years      BP:           159/90 mmHg Patient Gender: F             HR:           57 bpm. Exam Location:  Inpatient  Procedure: 2D Echo and Intracardiac Opacification Agent  Indications:    syncope  History:        Patient has prior history of Echocardiogram examinations, most recent 02/25/2012. Signs/Symptoms:Chest Pain and Syncope; Risk Factors:Diabetes.  Sonographer:    Cathie Hoops Referring Phys: [id number] PING T Chipper Herb   Sonographer Comments:  Technically difficult study due to poor echo windows and patient is obese. Image acquisition challenging due to patient body habitus. IMPRESSIONS   1. Left ventricular ejection fraction, by estimation, is 60 to 65%. The left ventricle has normal function. The left ventricle has no regional wall motion abnormalities. There is mild concentric left ventricular hypertrophy. Left ventricular diastolic parameters are consistent with Grade I diastolic dysfunction (impaired relaxation). 2. Right ventricular systolic function is normal. The right ventricular size is normal. There is normal pulmonary artery systolic pressure. 3. The mitral valve is abnormal- Caseous mitral annular calcification; this may be a benign finding. No evidence of mitral valve regurgitation. No evidence of mitral stenosis. Severe mitral annular calcification. 4. The aortic valve was not well visualized. Aortic valve regurgitation is not visualized.  Comparison(s): No significant change from prior study. Unable to access 2013 study.  FINDINGS Left Ventricle: Left ventricular ejection fraction, by estimation, is 60 to 65%. The left ventricle has normal function. The left ventricle has no regional wall motion abnormalities. Definity contrast agent was given IV to delineate the left ventricular endocardial borders. The left ventricular internal cavity size was normal in size. There is mild concentric left ventricular hypertrophy. Left ventricular diastolic parameters are consistent with Grade I diastolic dysfunction (impaired relaxation).  Right Ventricle: The right ventricular size is normal. No increase in right ventricular wall thickness. Right ventricular systolic function is normal. There is normal pulmonary artery systolic pressure. The tricuspid regurgitant velocity is 2.10 m/s, and with an assumed right atrial pressure of 3 mmHg, the estimated right ventricular systolic pressure is 20.6 mmHg.  Left Atrium: Left atrial size was  normal in size.  Right Atrium: Right atrial size was normal in size.  Pericardium: There is no evidence of pericardial effusion.  Mitral Valve: The mitral valve is abnormal. Severe mitral annular calcification. No evidence of mitral valve regurgitation. No evidence of mitral valve stenosis.  Tricuspid Valve: The tricuspid valve is normal in structure. Tricuspid valve regurgitation is not demonstrated. No evidence of tricuspid stenosis.  Aortic Valve: The aortic valve was not well visualized. Aortic valve regurgitation is not visualized. Aortic valve mean gradient measures 3.0 mmHg. Aortic valve peak gradient measures 6.0 mmHg. Aortic valve area, by VTI measures 1.60 cm.  Pulmonic Valve: The pulmonic valve was normal in structure. Pulmonic valve regurgitation is not visualized. No evidence of pulmonic stenosis.  Aorta: The aortic root and ascending aorta are structurally normal, with no evidence of dilitation.  IAS/Shunts: No atrial level shunt detected by color flow Doppler.   LEFT VENTRICLE PLAX 2D LVIDd:         3.90 cm     Diastology LVIDs:         2.60 cm     LV e' medial:    6.74 cm/s LV PW:  1.10 cm     LV E/e' medial:  10.4 LV IVS:        1.20 cm     LV e' lateral:   11.20 cm/s LVOT diam:     1.60 cm     LV E/e' lateral: 6.3 LV SV:         44 LV SV Index:   26 LVOT Area:     2.01 cm  LV Volumes (MOD) LV vol d, MOD A2C: 41.6 ml LV vol d, MOD A4C: 52.9 ml LV vol s, MOD A2C: 17.1 ml LV vol s, MOD A4C: 19.5 ml LV SV MOD A2C:     24.5 ml LV SV MOD A4C:     52.9 ml LV SV MOD BP:      28.5 ml  RIGHT VENTRICLE RV Basal diam:  3.00 cm RV Mid diam:    2.90 cm RV S prime:     10.90 cm/s TAPSE (M-mode): 2.1 cm  LEFT ATRIUM             Index        RIGHT ATRIUM          Index LA diam:        3.30 cm 1.95 cm/m   RA Area:     8.68 cm LA Vol (A2C):   32.1 ml 19.01 ml/m  RA Volume:   15.90 ml 9.42 ml/m LA Vol (A4C):   25.9 ml 15.34 ml/m LA Biplane Vol: 29.0 ml  17.18 ml/m AORTIC VALVE                    PULMONIC VALVE AV Area (Vmax):    1.59 cm     PV Vmax:       0.83 m/s AV Area (Vmean):   1.50 cm     PV Peak grad:  2.8 mmHg AV Area (VTI):     1.60 cm AV Vmax:           122.00 cm/s AV Vmean:          80.700 cm/s AV VTI:            0.274 m AV Peak Grad:      6.0 mmHg AV Mean Grad:      3.0 mmHg LVOT Vmax:         96.40 cm/s LVOT Vmean:        60.300 cm/s LVOT VTI:          0.218 m LVOT/AV VTI ratio: 0.80  AORTA Ao Root diam: 2.70 cm Ao Asc diam:  2.80 cm  MITRAL VALVE               TRICUSPID VALVE MV Area (PHT): 2.95 cm    TR Peak grad:   17.6 mmHg MV Decel Time: 257 msec    TR Vmax:        210.00 cm/s MV E velocity: 70.20 cm/s MV A velocity: 95.90 cm/s  SHUNTS MV E/A ratio:  0.73        Systemic VTI:  0.22 m Systemic Diam: 1.60 cm  Riley Lam MD Electronically signed by Riley Lam MD Signature Date/Time: 08/06/2022/12:10:52 PM    Final    MONITORS  LONG TERM MONITOR-LIVE TELEMETRY (3-14 DAYS) 09/12/2022  Narrative   Zio Patch (2 sepatate patches - total ~16 d) Oct 1-17, 2023   Predominant Underlying Rhythm: Sinus Rhythm. HR range 49-134 bpm. Avg 77 bpm   Rare isolated PVCs with no couplets/triplets  or bigeminy/trigeminy   Rare isolated PACs with some couplets and triplets but   1 Atrial Run of 8 beats (4.3 sec), HR range 75-130 bpm & avg 107 bpm (not patient triggered)   No Sustained Arrhythmias: Atrial Tachycardia (AT), Supraventricular Tachycardia (SVT), Atrial Fibrillation (A-Fib), Atrial Flutter (A-Flutter), Sustained Ventricular Tachycardia (VT)   There were 94 patient triggered events, the overwhelming majority of which were sinus rhythm with heart rate range between 60 and 110 bpm.  On rare occasions there were associated PVCs.  Pretty benign study.  No significant findings.  Nothing that would explain symptoms let alone syncope or near syncope.  Bryan Lemma, MD   CT SCANS  CT CORONARY  MORPH W/CTA COR W/SCORE 08/26/2022  Addendum 08/26/2022  3:27 PM ADDENDUM REPORT: 08/26/2022 15:25  EXAM: OVER-READ INTERPRETATION  CT CHEST  The following report is an over-read performed by radiologist Dr. Marinda [facility]k [facility] on 08/26/2022. This over-read does not include interpretation of cardiac or coronary anatomy or pathology. The coronary CTA interpretation by the cardiologist is attached.  COMPARISON:  CT of the chest on 12/02/2016  FINDINGS: No significant noncardiac vascular findings. Visualized mediastinum and hilar regions demonstrate no lymphadenopathy or masses. Visualized lungs show no evidence of pulmonary edema, consolidation, pneumothorax, nodule or pleural fluid. Visualized bony structures and upper abdomen are unremarkable.  IMPRESSION: No significant extracardiac findings.   Electronically Signed By: Irish Lack M.D. On: 08/26/2022 15:25  Narrative CLINICAL DATA:  Chest pain  EXAM: Cardiac CTA  MEDICATIONS: Sub lingual nitro. 4mg  x 2  TECHNIQUE: The patient was scanned on a Siemens 192 slice scanner. Gantry rotation speed was 250 msecs. Collimation was 0.6 mm. A 100 kV prospective scan was triggered in the ascending thoracic aorta at 35-75% of the R-R interval. Average HR during the scan was 60 bpm. The 3D data set was interpreted on a dedicated work station using MPR, MIP and VRT modes. A total of 80cc of contrast was used.  FINDINGS: Non-cardiac: See separate report from [facility].  Pulmonary veins drain normally to the left atrium. No LA appendage thrombus.  Calcium Score: 65 Agatston units.  Coronary Arteries: Left dominant with no anomalies  LM: No plaque or stenosis.  LAD system: Calcified plaque proximal LAD with mild (1-24%) stenosis. Mixed plaque proximal D1, mild (25-49%) stenosis. Noncalcified plaque mid D1, moderate (50-69%) stenosis. FFR 0.76 (borderline significance) in the distal  D1.  Circumflex system: Large vessel provides left PDA. Noncalcified plaque proximal LCx, mild (25-49%) stenosis. FFR 0.85 in the mid LCx.  RCA system: Small, nondominant RCA with no plaque or stenosis.  IMPRESSION: 1. Coronary artery calcium score 65 Agatston units. This places the patient in the 82nd percentile for age and gender, suggesting high risk for future cardiac events.  2. Moderate stenosis mid D1 of borderline hemodynamic significance (FFR 0.76).  3.  Nonobstructive disease in the LAD and the dominant LCx.  [employer] account executive  Electronically Signed: By: Marca Ancona M.D. On: 08/26/2022 13:09          Risk Assessment/Calculations:             Physical Exam:   VS:  BP (!) 128/56   Pulse 78   Resp 16   Ht 5\' 1"  (1.549 m)   Wt 151 lb 8 oz (68.7 kg)   SpO2 98%   BMI 28.63 kg/m    Wt Readings from Last 3 Encounters:  09/02/23 151 lb 8 oz (68.7 kg)  08/25/23 154  lb 5.2 oz (70 kg)  08/14/23 153 lb (69.4 kg)    GEN: Well nourished, well developed in no acute distress NECK: No JVD; No carotid bruits CARDIAC: RRR, no murmurs, rubs, gallops RESPIRATORY:  Clear to auscultation without rales, wheezing or rhonchi  ABDOMEN: Soft, non-tender, non-distended EXTREMITIES:  No edema; No deformity   ASSESSMENT AND PLAN: .    CAD/HLD, LDL goal less than 70 - 08/26/22 calcium score 65 with moderate stenosis in mid D1 of borderline hemodynamic significance (FFR 0.76), nonobstructive disease in LAD and LCx. No anginal symptoms. Recommended for medical management. ED visit 08/2023 with atypical right sided chest pain, reassurance provided. GDMT Aspirin, Rosuvastatin, Repatha. 02/2023 LDL 49. Continue Repatha and Rosuvastatin 5mg  three times per week.    Vertigo - Established with ENT for vestibular evaluation. MRI brain, CT angio head/neck 08/2023 unremarkable. Plans to do vestibular physical therapy.    HTN - BP well controlled. Continue current antihypertensive regimen.      DM2 / Diabetic neuropathy - Continue to follow with PCP.           Dispo: follow up in 6 mos  Signed, Alver Sorrow, NP

## 2023-09-02 NOTE — Patient Instructions (Signed)
Medication Instructions:  Your physician has recommended you make the following change in your medication:  Rosuvastatin 5 mg three times a week (medication has been refilled)  *If you need a refill on your cardiac medications before your next appointment, please call your pharmacy*  Follow-Up: At [facility], you and your health needs are our priority.  As part of our continuing mission to provide you with exceptional heart care, we have created designated Provider Care Teams.  These Care Teams include your primary Cardiologist (physician) and Advanced Practice Providers (APPs -  Physician Assistants and Nurse Practitioners) who all work together to provide you with the care you need, when you need it.  We recommend signing up for the patient portal called "MyChart".  Sign up information is provided on this After Visit Summary.  MyChart is used to connect with patients for Virtual Visits (Telemedicine).  Patients are able to view lab/test results, encounter notes, upcoming appointments, etc.  Non-urgent messages can be sent to your provider as well.   To learn more about what you can do with MyChart, go to [email or web address].    Your next appointment:   Follow up with Dr. Duke Salvia or Gillian Shields, NP in 6 months.

## 2023-09-12 ENCOUNTER — Encounter: Payer: Self-pay | Admitting: Physical Therapy

## 2023-09-12 ENCOUNTER — Other Ambulatory Visit: Payer: Self-pay

## 2023-09-12 ENCOUNTER — Ambulatory Visit: Payer: Medicare PPO | Attending: Physician Assistant | Admitting: Physical Therapy

## 2023-09-12 VITALS — BP 112/66 | HR 86

## 2023-09-12 DIAGNOSIS — R2681 Unsteadiness on feet: Secondary | ICD-10-CM

## 2023-09-12 DIAGNOSIS — R42 Dizziness and giddiness: Secondary | ICD-10-CM
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
## 2023-09-15 ENCOUNTER — Encounter: Payer: Self-pay | Admitting: Physical Therapy

## 2023-09-15 ENCOUNTER — Ambulatory Visit: Payer: Medicare PPO | Admitting: Physical Therapy

## 2023-09-15 VITALS — BP 117/67 | HR 73

## 2023-09-15 DIAGNOSIS — R42 Dizziness and giddiness: Secondary | ICD-10-CM | POA: Diagnosis not present

## 2023-09-15 DIAGNOSIS — R2681 Unsteadiness on feet: Secondary | ICD-10-CM | POA: Diagnosis not present
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
## 2023-09-19 ENCOUNTER — Encounter: Payer: Self-pay | Admitting: Physical Therapy

## 2023-09-19 ENCOUNTER — Ambulatory Visit: Payer: Medicare PPO | Admitting: Physical Therapy

## 2023-09-19 VITALS — BP 103/68 | HR 78

## 2023-09-19 DIAGNOSIS — R42 Dizziness and giddiness: Secondary | ICD-10-CM | POA: Diagnosis not present

## 2023-09-19 DIAGNOSIS — R2681 Unsteadiness on feet: Secondary | ICD-10-CM | POA: Diagnosis not present

## 2023-09-19 NOTE — Patient Instructions (Signed)
Gaze Stabilization: Sitting    Keeping eyes on target on wall a few feet away, tilt head down 15-30 and move head side to side for ___30_ seconds. Repeat while moving head up and down for __30__ seconds.  Perform 2-3 sets of each.   Do __1-2__ sessions per day.  Try to keep double vision as less as possible.   Copyright  [employer]. All rights reserved.
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
## 2023-09-23 ENCOUNTER — Encounter: Payer: Medicare PPO | Admitting: Physical Therapy

## 2023-09-25 DIAGNOSIS — H40013 Open angle with borderline findings, low risk, bilateral: Secondary | ICD-10-CM | POA: Diagnosis not present

## 2023-09-25 LAB — HM DIABETES EYE EXAM

## 2023-09-26 ENCOUNTER — Encounter: Payer: Medicare PPO | Admitting: Physical Therapy

## 2023-09-30 ENCOUNTER — Encounter: Payer: Medicare PPO | Admitting: Physical Therapy

## 2023-10-01 ENCOUNTER — Encounter: Payer: Self-pay | Admitting: Nurse Practitioner

## 2023-10-01 ENCOUNTER — Ambulatory Visit: Payer: Medicare PPO | Admitting: Nurse Practitioner

## 2023-10-01 VITALS — BP 124/78 | HR 77 | Wt 150.4 lb

## 2023-10-01 DIAGNOSIS — I25118 Atherosclerotic heart disease of native coronary artery with other forms of angina pectoris: Secondary | ICD-10-CM

## 2023-10-01 DIAGNOSIS — E162 Hypoglycemia, unspecified: Secondary | ICD-10-CM | POA: Diagnosis not present

## 2023-10-01 DIAGNOSIS — E1149 Type 2 diabetes mellitus with other diabetic neurological complication: Secondary | ICD-10-CM

## 2023-10-01 DIAGNOSIS — R151 Fecal smearing: Secondary | ICD-10-CM | POA: Diagnosis not present

## 2023-10-01 DIAGNOSIS — K580 Irritable bowel syndrome with diarrhea: Secondary | ICD-10-CM

## 2023-10-01 DIAGNOSIS — H832X3 Labyrinthine dysfunction, bilateral: Secondary | ICD-10-CM

## 2023-10-01 DIAGNOSIS — E119 Type 2 diabetes mellitus without complications: Secondary | ICD-10-CM | POA: Diagnosis not present

## 2023-10-01 DIAGNOSIS — E1169 Type 2 diabetes mellitus with other specified complication: Secondary | ICD-10-CM | POA: Diagnosis not present

## 2023-10-01 DIAGNOSIS — D582 Other hemoglobinopathies: Secondary | ICD-10-CM

## 2023-10-01 DIAGNOSIS — K760 Fatty (change of) liver, not elsewhere classified: Secondary | ICD-10-CM | POA: Diagnosis not present

## 2023-10-01 DIAGNOSIS — Z23 Encounter for immunization: Secondary | ICD-10-CM

## 2023-10-01 DIAGNOSIS — F5101 Primary insomnia: Secondary | ICD-10-CM

## 2023-10-01 DIAGNOSIS — I10 Essential (primary) hypertension: Secondary | ICD-10-CM

## 2023-10-01 DIAGNOSIS — M3320 Polymyositis, organ involvement unspecified: Secondary | ICD-10-CM

## 2023-10-01 DIAGNOSIS — E559 Vitamin D deficiency, unspecified: Secondary | ICD-10-CM

## 2023-10-01 MED ORDER — VITAMIN D (ERGOCALCIFEROL) 1.25 MG (50000 UNIT) PO CAPS
50000.0000 [IU] | ORAL_CAPSULE | ORAL | 3 refills | Status: DC
Start: 2023-10-01 — End: 2024-01-20

## 2023-10-01 MED ORDER — SEMAGLUTIDE (1 MG/DOSE) 4 MG/3ML ~~LOC~~ SOPN
1.0000 mg | PEN_INJECTOR | SUBCUTANEOUS | 3 refills | Status: DC
  Filled 2024-01-27: qty 9, 84d supply, fill #0
  Filled 2024-04-17: qty 9, 84d supply, fill #1
  Filled 2024-07-09: qty 9, 84d supply, fill #2

## 2023-10-01 MED ORDER — GABAPENTIN 600 MG PO TABS: 600.0000 mg | ORAL_TABLET | Freq: Every day | ORAL | 3 refills | Status: DC

## 2023-10-01 NOTE — Progress Notes (Signed)
[name], DNP, AGNP-c [facility]  [address] [place], [place] [address] [phone]  ESTABLISHED PATIENT- Chronic Health and/or Follow-Up Visit  Blood pressure 124/78, pulse 77, weight 150 lb 6.4 oz (68.2 kg).    Pam [name] is a 72 y.o. year old female presenting today for evaluation and management of chronic conditions.   The patient, a 72 year old with a history of diabetes, presents with multiple concerns. The primary complaint is persistent discomfort in a toe that has not been right since a previous consultation with a podiatrist. The patient reports constant pain and discomfort in the toe, which has not improved despite previous foot care.  The patient also reports a history of falls, with the most recent incident occurring while walking her dog. The patient attributes these falls to dizziness and balance issues, which have been evaluated through vestibular therapy. The patient has also been experiencing tingling in her hands, particularly upon waking, which she attributes to forgetting to take her prescribed gabapentin.  The patient's diabetes management appears to be effective, with recent blood sugar readings reported as satisfactory and an A1C of 6.2. However, the patient has experienced two recent hypoglycemic episodes, one due to forgetting to eat and another occurring in the morning. The patient is currently on 1mg  of Ozempic for diabetes management.  The patient also reports difficulty sleeping, often waking up between 3 and 5 am regardless of bedtime. She does not attribute these awakenings to a need to use the bathroom, but rather to an unexplained alertness. The patient has considered taking melatonin to aid with sleep.  The patient has been trying to maintain a healthy lifestyle, including a plant-based diet and regular exercise, although she reports not having been to the gym since April. Despite this, the patient has only gained a small amount  of weight and is close to achieving a healthy weight according to her personal goals.  The patient also mentions concerns about her vision, noting that she has astigmatism and requires prisms in her glasses. She has been seeing an eye doctor annually due to her diabetes and has not required cataract surgery, which she considers unusual for her age. The patient is also dealing with feelings of overwhelm and has been seeking a therapist to help manage these feelings.  All ROS negative with exception of what is listed above.   PHYSICAL EXAM Physical Exam Vitals and nursing note reviewed.  Constitutional:      General: She is not in acute distress.    Appearance: Normal appearance.  HENT:     Head: Normocephalic.  Eyes:     Conjunctiva/sclera: Conjunctivae normal.  Neck:     Vascular: No carotid bruit.  Cardiovascular:     Rate and Rhythm: Normal rate and regular rhythm.     Pulses: Normal pulses.     Heart sounds: Normal heart sounds. No murmur heard. Pulmonary:     Effort: Pulmonary effort is normal.     Breath sounds: Normal breath sounds.  Musculoskeletal:     Right lower leg: No edema.     Left lower leg: No edema.  Skin:    General: Skin is warm and dry.     Capillary Refill: Capillary refill takes less than 2 seconds.  Neurological:     General: No focal deficit present.     Mental Status: She is alert and oriented to person, place, and time.     Motor: No weakness.  Psychiatric:        Mood  and Affect: Mood normal.      PLAN Problem List Items Addressed This Visit     Coronary artery disease of native artery of native heart with stable angina pectoris (HCC)   Relevant Medications   gabapentin (NEURONTIN) 600 MG tablet   Fecal smearing   Relevant Orders   Ambulatory referral to Gastroenterology   Vestibular hypofunction, bilateral   NAFLD (nonalcoholic fatty liver disease)   Hemoglobin C disease (HCC)   Type 2 diabetes mellitus with neurological complications  (HCC) - Primary   Relevant Medications   Semaglutide, 1 MG/DOSE, 4 MG/3ML SOPN   gabapentin (NEURONTIN) 600 MG tablet   Hyperlipidemia associated with type 2 diabetes mellitus (HCC)   Relevant Medications   Semaglutide, 1 MG/DOSE, 4 MG/3ML SOPN   Hypertension complicating diabetes (HCC)   Relevant Medications   Semaglutide, 1 MG/DOSE, 4 MG/3ML SOPN   Hypoglycemia   Polymyositis (HCC)   Irritable bowel syndrome with diarrhea   Other Visit Diagnoses     Primary insomnia       Vitamin D deficiency       Relevant Medications   Vitamin D, Ergocalciferol, (DRISDOL) 1.25 MG (50000 UNIT) CAPS capsule   Need for COVID-19 vaccine       Relevant Orders   Pfizer Comirnaty Covid -19 Vaccine 57yrs and older (Completed)   Need for influenza vaccination       Relevant Orders   Flu Vaccine Trivalent High Dose (Fluad) (Completed)     Diabetes Mellitus Diabetes mellitus with generally well-controlled blood sugar levels. Occasional hypoglycemic episodes due to missed meals. Uses a blood sugar monitor with alarms. Reports tingling in hands when gabapentin is missed. Discussed hypoglycemia risks and importance of regular meals. Informed consent obtained for continued diabetes management and gabapentin use. - Continue current diabetes management - Renew gabapentin 600 mg - Monitor blood sugar levels regularly - Discuss hypoglycemia management strategies - Consider referral to a podiatrist for foot care  Vestibular Dysfunction Reports dizziness and falls, with a recent fall causing hand pain. Undergoing vestibular therapy and uses glasses with prisms for severe astigmatism. No physical abnormalities found in previous evaluations. Discussed fall risks and benefits of vestibular therapy. Informed consent obtained for continued therapy and use of hiking stick for stability. - Continue vestibular therapy - Follow up with eye doctor for updated prescription - Use hiking stick for stability when  walking  Depression Difficulty adjusting to retirement, feelings of shame, and trouble maintaining household cleanliness. Desires cognitive behavioral therapy but has difficulty finding a provider who accepts Medicare. Discussed benefits of cognitive behavioral therapy and potential referral through [facility]. Informed consent obtained for pursuing therapy options. - Contact [employer] for referral assistance - Consider cognitive behavioral therapy - Encourage acceptance of help from friends and former students - Discuss melatonin use for sleep disturbances  General Health Maintenance Up to date with eye exams but overdue for some vaccinations. Reports asthma and desires COVID and flu vaccinations. Discussed benefits and risks of COVID and flu vaccines. Informed consent obtained for administration of vaccines. - Administer COVID and flu vaccines - Recommend pneumonia, Tdap, and Shingrix vaccines at pharmacy - Encourage regular exercise and healthy diet - Monitor vitamin D levels due to hair thinning concerns  Follow-up - Schedule follow-up appointment in February - Monitor A1c levels - Ensure all medications are refilled and taken as prescribed.  Return in about 3 months (around 01/01/2024) for Med Management 30.  SaraBeth Anaiah Mcmannis, DNP, AGNP-c Time: 46 minutes, >50% spent counseling, care  coordination, chart review, and documentation.

## 2023-10-01 NOTE — Patient Instructions (Addendum)
If [employer] doesn't have an option for you with a therapist then I want you to let us know and we can send the referral.  I would like you to get the Pneumonia shot in the next month or so then you can start the shingles series. This has to be done at the pharmacy.     You can take melatonin for sleep when you wake up in the night. You can also take this before bed.  I recommend 5-10mg   If you wake up, don't turn on the lights or television. Use a flashlight to get the melatonin out and then think happy things and lay back down.   EAT YOUR ELEPHANT ONE BITE AT A TIME....you CAN and WILL do this!!!

## 2023-10-03 ENCOUNTER — Ambulatory Visit: Payer: Medicare PPO | Admitting: Physical Therapy

## 2023-10-06 ENCOUNTER — Encounter: Payer: Self-pay | Admitting: Nurse Practitioner

## 2023-10-07 ENCOUNTER — Ambulatory Visit: Payer: Medicare PPO | Admitting: Physical Therapy

## 2023-10-08 ENCOUNTER — Encounter: Payer: Self-pay | Admitting: Nurse Practitioner

## 2023-10-13 ENCOUNTER — Telehealth: Payer: Self-pay

## 2023-10-13 ENCOUNTER — Other Ambulatory Visit (HOSPITAL_COMMUNITY): Payer: Self-pay

## 2023-10-13 NOTE — Telephone Encounter (Signed)
    Pharmacy Patient Advocate Encounter   Received notification from Physician's Office that prior authorization for [facility] Sensor is required/requested.   Insurance verification completed.   The patient is insured through [place] .   Per test claim: PA required; PA submitted to above mentioned insurance via CoverMyMeds Key/confirmation #/EOC Key: [id number]    Status is pending

## 2023-10-14 ENCOUNTER — Other Ambulatory Visit (HOSPITAL_COMMUNITY): Payer: Self-pay

## 2023-10-14 NOTE — Telephone Encounter (Signed)
Pharmacy Patient Advocate Encounter  Received notification from [facility] that Prior Authorization for FREESTYLE LIBRE2 SENSOR has been APPROVED from 1.1.24 to 12.31.25. Ran test claim, Copay is $0.00. This test claim was processed through [facility]- copay amounts may vary at other pharmacies due to pharmacy/plan contracts, or as the patient moves through the different stages of their insurance plan.   PA #/Case ID/Reference #: Isabelle Course

## 2023-10-14 NOTE — Telephone Encounter (Signed)
Notified pt through my chart message.

## 2023-10-16 ENCOUNTER — Ambulatory Visit: Payer: Medicare PPO | Attending: Nurse Practitioner | Admitting: Physical Therapy

## 2023-10-16 ENCOUNTER — Encounter: Payer: Self-pay | Admitting: Physical Therapy

## 2023-10-16 VITALS — BP 128/61 | HR 83

## 2023-10-16 DIAGNOSIS — M6281 Muscle weakness (generalized): Secondary | ICD-10-CM | POA: Insufficient documentation

## 2023-10-16 DIAGNOSIS — R42 Dizziness and giddiness: Secondary | ICD-10-CM | POA: Diagnosis not present

## 2023-10-16 DIAGNOSIS — R2681 Unsteadiness on feet: Secondary | ICD-10-CM | POA: Diagnosis not present
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
## 2023-10-23 ENCOUNTER — Encounter: Payer: Self-pay | Admitting: Physical Therapy

## 2023-10-23 ENCOUNTER — Ambulatory Visit: Payer: Medicare PPO | Admitting: Physical Therapy

## 2023-10-23 VITALS — BP 135/64 | HR 67

## 2023-10-23 DIAGNOSIS — R42 Dizziness and giddiness: Secondary | ICD-10-CM | POA: Diagnosis not present

## 2023-10-23 DIAGNOSIS — R2681 Unsteadiness on feet: Secondary | ICD-10-CM

## 2023-10-23 DIAGNOSIS — M6281 Muscle weakness (generalized): Secondary | ICD-10-CM | POA: Diagnosis not present
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
## 2023-10-27 ENCOUNTER — Encounter: Payer: Self-pay | Admitting: Nurse Practitioner

## 2023-10-30 ENCOUNTER — Ambulatory Visit: Payer: Medicare PPO | Admitting: Physical Therapy

## 2023-10-30 ENCOUNTER — Encounter: Payer: Self-pay | Admitting: Physical Therapy

## 2023-10-30 VITALS — BP 122/67

## 2023-10-30 DIAGNOSIS — M6281 Muscle weakness (generalized): Secondary | ICD-10-CM | POA: Diagnosis not present

## 2023-10-30 DIAGNOSIS — R2681 Unsteadiness on feet: Secondary | ICD-10-CM

## 2023-10-30 DIAGNOSIS — R42 Dizziness and giddiness: Secondary | ICD-10-CM | POA: Diagnosis not present
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
## 2023-11-06 NOTE — Telephone Encounter (Signed)
Reviewed chart, notes from most recent eye exam is available. Closing encounter.

## 2023-11-07 ENCOUNTER — Encounter: Payer: Self-pay | Admitting: Physical Therapy

## 2023-11-07 ENCOUNTER — Ambulatory Visit: Payer: Medicare PPO | Admitting: Physical Therapy

## 2023-11-07 DIAGNOSIS — R42 Dizziness and giddiness: Secondary | ICD-10-CM | POA: Diagnosis not present

## 2023-11-07 DIAGNOSIS — R2681 Unsteadiness on feet: Secondary | ICD-10-CM

## 2023-11-07 DIAGNOSIS — M6281 Muscle weakness (generalized): Secondary | ICD-10-CM | POA: Diagnosis not present
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
## 2023-11-13 ENCOUNTER — Other Ambulatory Visit: Payer: Self-pay | Admitting: Internal Medicine

## 2023-11-13 ENCOUNTER — Ambulatory Visit: Payer: Medicare PPO | Attending: Nurse Practitioner | Admitting: Physical Therapy

## 2023-11-13 ENCOUNTER — Encounter: Payer: Self-pay | Admitting: Physical Therapy

## 2023-11-13 DIAGNOSIS — M6281 Muscle weakness (generalized): Secondary | ICD-10-CM | POA: Diagnosis not present

## 2023-11-13 DIAGNOSIS — R42 Dizziness and giddiness: Secondary | ICD-10-CM

## 2023-11-13 DIAGNOSIS — R2681 Unsteadiness on feet: Secondary | ICD-10-CM | POA: Diagnosis not present
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
## 2023-11-14 ENCOUNTER — Ambulatory Visit (HOSPITAL_BASED_OUTPATIENT_CLINIC_OR_DEPARTMENT_OTHER): Payer: Medicare PPO | Admitting: Family

## 2023-11-14 VITALS — BP 142/58 | HR 90 | Ht 62.0 in | Wt 154.2 lb

## 2023-11-14 DIAGNOSIS — E785 Hyperlipidemia, unspecified: Secondary | ICD-10-CM | POA: Diagnosis not present

## 2023-11-14 DIAGNOSIS — I10 Essential (primary) hypertension: Secondary | ICD-10-CM

## 2023-11-14 DIAGNOSIS — M797 Fibromyalgia: Secondary | ICD-10-CM | POA: Diagnosis not present

## 2023-11-14 DIAGNOSIS — I25118 Atherosclerotic heart disease of native coronary artery with other forms of angina pectoris: Secondary | ICD-10-CM | POA: Diagnosis not present

## 2023-11-14 NOTE — Progress Notes (Signed)
 Cardiology Office Note:  .   Date:  11/16/2023  ID:  Pam [name], DOB December 03, 1950, MRN [id number] PCP: Oris [name] BRAVO, NP  [facility] Providers Cardiologist:  Annabella Scarce, MD    History of Present Illness: Pam [name] is a 73 y.o. female  with a hx of CAD, DM 2, COPD, HLD, FM, diabetic neuropathy, fibromyalgia, chronic dizziness with BPPV.    Longstanding history of dizziness leading to negative MRI of brain December 2022.  Admitted 9/25 - 08/06/2022 with syncope and chest pain.  Syncope consistent with vertigo or vagal component. ZIO no arrhythmia. CT head/MRI brain negative acute stroke. Echo 08/06/22 normal LVEF 60 to 65%, gr1dd, mitral annular calcification without regurgitation. Subsequent outpatient cardiac CTA 08/26/22 coronary calcium  score of 65 placing her in the 82nd percentile for age and gender suggesting high risk for future cardiac events.  Noted moderate stenosis in mid D1 of borderline hemodynamic significance with FFR of 0.76 and nonobstructive disease in the LAD and dominant LCx.  Was recommended for medical management by Dr. Anner given distal location of borderline lesion.   Presents today for follow up. Pleasant lady who lives at home with her dog. 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  Notes in a fibromyalgia flare for about a week associated with headache. Is participating in vestibular therapy for vertigo. No recent chest pain, does have bouts of costochondritis with fibromyalgia. Home BP 130-140 though in vestibular therapy 110-130s.   ROS: Please see the history of present illness.    All other systems reviewed and are negative.   Studies Reviewed: .           Risk Assessment/Calculations:            Physical Exam:   VS:  BP (!) 142/58   Pulse 90   Ht 5' 2 (1.575 m)   Wt 154 lb 3.2 oz (69.9 kg)   SpO2 96%   BMI 28.20 kg/m    Wt Readings from Last 3 Encounters:  11/14/23 154 lb 3.2 oz (69.9 kg)  10/01/23 150 lb 6.4 oz (68.2 kg)   09/02/23 151 lb 8 oz (68.7 kg)    GEN: Well nourished, well developed in no acute distress NECK: No JVD; No carotid bruits CARDIAC: RRR, no murmurs, rubs, gallops RESPIRATORY:  Clear to auscultation without rales, wheezing or rhonchi  ABDOMEN: Soft, non-tender, non-distended EXTREMITIES:  No edema; No deformity   ASSESSMENT AND PLAN: .    CAD/HLD, LDL goal less than 70 - 08/26/22 calcium  score 65 with moderate stenosis in mid D1 of borderline hemodynamic significance (FFR 0.76), nonobstructive disease in LAD and LCx. No anginal symptoms. Recommended for medical management. Stable with no anginal symptoms. No indication for ischemic evaluation.   GDMT Aspirin , Rosuvastatin , Repatha . 02/2023 LDL 49. Continue Repatha  and Rosuvastatin  5mg  three times per week. Previously did not tolerate higher doses of Rosuvastatin  due to myalgias.   Fibromyalgia - In midst of a flare. Interested in specialist management as previously followed with FM specialist when she lived in [place]. Her goal is to maintain independence, mobility with non-opioids if possible. Refer to physical medicine rehab.   Vertigo - Established with ENT for vestibular evaluation. MRI brain, CT angio head/neck 08/2023 unremarkable. Participating in vestibular physical therapy.    HTN - BP at home 130-140s. Has been routinely <130 at her vestibular therapy appointments. Initial BP in clinic 142/58 with repeat SBP 138. Discussed to monitor BP at home at least 2 hours after  medications and sitting for 5-10 minutes. Likely elevated in setting of FM flare and pain. If routinely >130/80 at home and in vestibular therapy visits consider initiation of Amlodipine 2.5mg  .    DM2 / Diabetic neuropathy - Continue to follow with PCP. Appreciate inclusion of Ozempic  for cardiovascular benefit.           Dispo: follow up in 3-4 months  Signed, Reche GORMAN Finder, NP

## 2023-11-14 NOTE — Patient Instructions (Addendum)
 Medication Instructions:  Continue your current medications.   *If you need a refill on your cardiac medications before your next appointment, please call your pharmacy*  Follow-Up: At [facility], you and your health needs are our priority.  As part of our continuing mission to provide you with exceptional heart care, we have created designated Provider Care Teams.  These Care Teams include your primary Cardiologist (physician) and Advanced Practice Providers (APPs -  Physician Assistants and Nurse Practitioners) who all work together to provide you with the care you need, when you need it.  We recommend signing up for the patient portal called MyChart.  Sign up information is provided on this After Visit Summary.  MyChart is used to connect with patients for Virtual Visits (Telemedicine).  Patients are able to view lab/test results, encounter notes, upcoming appointments, etc.  Non-urgent messages can be sent to your provider as well.   To learn more about what you can do with MyChart, go to [email or web address].    Your next appointment:   3-4 month(s)  Provider:   [name], NP    Other Instructions  Referral placed to: [facility] for management of fibromyalgia [address] [place], [place] [address] [phone] Sven SQUIBB. Lorilee, MD Wrote the book: The Fibromyalgia Relief Journal  Future options if you need a rheumatologist: [facility]  Dr. Leni, Dr. [facility]  Dr. Mai, Dr. Ishmael

## 2023-11-16 ENCOUNTER — Encounter (HOSPITAL_BASED_OUTPATIENT_CLINIC_OR_DEPARTMENT_OTHER): Payer: Self-pay | Admitting: Family

## 2023-11-21 ENCOUNTER — Ambulatory Visit: Payer: Medicare PPO | Admitting: Physical Therapy

## 2023-11-21 ENCOUNTER — Encounter: Payer: Self-pay | Admitting: Physical Therapy

## 2023-11-21 VITALS — BP 118/60 | HR 68

## 2023-11-21 DIAGNOSIS — R2681 Unsteadiness on feet: Secondary | ICD-10-CM | POA: Diagnosis not present

## 2023-11-21 DIAGNOSIS — M6281 Muscle weakness (generalized): Secondary | ICD-10-CM | POA: Diagnosis not present

## 2023-11-21 DIAGNOSIS — R42 Dizziness and giddiness: Secondary | ICD-10-CM
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
## 2023-11-27 ENCOUNTER — Encounter: Payer: Self-pay | Admitting: Physical Medicine and Rehabilitation

## 2023-11-28 ENCOUNTER — Ambulatory Visit: Payer: Medicare PPO | Admitting: Physical Therapy

## 2023-11-28 ENCOUNTER — Encounter: Payer: Self-pay | Admitting: Physical Therapy

## 2023-11-28 VITALS — BP 126/80 | HR 80

## 2023-11-28 DIAGNOSIS — M6281 Muscle weakness (generalized): Secondary | ICD-10-CM | POA: Diagnosis not present

## 2023-11-28 DIAGNOSIS — R2681 Unsteadiness on feet: Secondary | ICD-10-CM | POA: Diagnosis not present

## 2023-11-28 DIAGNOSIS — R42 Dizziness and giddiness: Secondary | ICD-10-CM | POA: Diagnosis not present
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
## 2023-12-01 ENCOUNTER — Encounter: Payer: Self-pay | Admitting: Physical Therapy

## 2023-12-04 ENCOUNTER — Other Ambulatory Visit: Payer: Self-pay | Admitting: Nurse Practitioner

## 2023-12-04 ENCOUNTER — Ambulatory Visit: Payer: Medicare PPO | Admitting: Physical Therapy

## 2023-12-04 DIAGNOSIS — E162 Hypoglycemia, unspecified: Secondary | ICD-10-CM

## 2023-12-04 DIAGNOSIS — E1149 Type 2 diabetes mellitus with other diabetic neurological complication: Secondary | ICD-10-CM

## 2023-12-11 ENCOUNTER — Ambulatory Visit: Payer: Medicare PPO | Admitting: Physical Therapy

## 2023-12-30 ENCOUNTER — Other Ambulatory Visit: Payer: Self-pay | Admitting: Internal Medicine

## 2023-12-31 ENCOUNTER — Encounter: Payer: Medicare PPO | Admitting: Nurse Practitioner

## 2024-01-01 ENCOUNTER — Encounter: Payer: Medicare PPO | Admitting: Family Medicine

## 2024-01-07 ENCOUNTER — Encounter: Payer: Self-pay | Admitting: Nurse Practitioner

## 2024-01-09 ENCOUNTER — Encounter: Payer: Medicare PPO | Attending: Physical Medicine and Rehabilitation | Admitting: Physical Medicine and Rehabilitation

## 2024-01-09 ENCOUNTER — Encounter: Payer: Self-pay | Admitting: Physical Medicine and Rehabilitation

## 2024-01-09 VITALS — Ht 62.0 in | Wt 150.0 lb

## 2024-01-09 DIAGNOSIS — G8929 Other chronic pain: Secondary | ICD-10-CM | POA: Insufficient documentation

## 2024-01-09 DIAGNOSIS — G894 Chronic pain syndrome: Secondary | ICD-10-CM | POA: Insufficient documentation

## 2024-01-09 DIAGNOSIS — Z813 Family history of other psychoactive substance abuse and dependence: Secondary | ICD-10-CM | POA: Diagnosis not present

## 2024-01-09 DIAGNOSIS — G6289 Other specified polyneuropathies: Secondary | ICD-10-CM | POA: Insufficient documentation

## 2024-01-09 DIAGNOSIS — M797 Fibromyalgia: Secondary | ICD-10-CM | POA: Diagnosis not present

## 2024-01-09 DIAGNOSIS — Z794 Long term (current) use of insulin: Secondary | ICD-10-CM

## 2024-01-09 DIAGNOSIS — M545 Low back pain, unspecified: Secondary | ICD-10-CM | POA: Diagnosis not present

## 2024-01-09 DIAGNOSIS — G4701 Insomnia due to medical condition: Secondary | ICD-10-CM | POA: Insufficient documentation

## 2024-01-09 DIAGNOSIS — Z87891 Personal history of nicotine dependence: Secondary | ICD-10-CM | POA: Diagnosis not present

## 2024-01-09 DIAGNOSIS — E1142 Type 2 diabetes mellitus with diabetic polyneuropathy: Secondary | ICD-10-CM | POA: Insufficient documentation

## 2024-01-09 DIAGNOSIS — E1149 Type 2 diabetes mellitus with other diabetic neurological complication: Secondary | ICD-10-CM | POA: Insufficient documentation
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
## 2024-01-09 NOTE — Patient Instructions (Addendum)
 Foods that may reduce pain: 1) Ginger (especially studied for arthritis)- reduce leukotriene production to decrease inflammation 2) Blueberries- high in phytonutrients that decrease inflammation 3) Salmon- marine omega-3s reduce joint swelling and pain 4) Pumpkin seeds- reduce inflammation 5) dark chocolate- reduces inflammation 6) turmeric- reduces inflammation 7) tart cherries - reduce pain and stiffness 8) extra virgin olive oil - its compound olecanthal helps to block prostaglandins  9) chili peppers- can be eaten or applied topically via capsaicin 10) mint- helpful for headache, muscle aches, joint pain, and itching 11) garlic- reduces inflammation  Link to further information on diet for chronic pain: http://www.randall.com/    Insomnia: -Try to go outside near sunrise -Get exercise during the day.  -Turn off all devices an hour before bedtime.  -Teas that can benefit: chamomile, valerian root, Brahmi (Bacopa) -Can consider over the counter melatonin, magnesium, and/or L-theanine. Melatonin is an anti-oxidant with multiple health benefits. Magnesium is involved in greater than 300 enzymatic reactions in the body and most of [place] are deficient as our soil is often depleted. There are 7 different types of magnesium- Bioptemizer's is a supplement with all 7 types, and each has unique benefits. Magnesium can also help with constipation and anxiety.  -Pistachios naturally increase the production of melatonin -Cozy Earth bamboo bed sheets are free from toxic chemicals.  -Tart cherry juice or a tart cherry supplement can improve sleep and soreness post-workout

## 2024-01-20 ENCOUNTER — Other Ambulatory Visit: Payer: Self-pay | Admitting: Nurse Practitioner

## 2024-01-20 ENCOUNTER — Other Ambulatory Visit (HOSPITAL_COMMUNITY): Payer: Self-pay

## 2024-01-20 ENCOUNTER — Ambulatory Visit: Payer: Medicare PPO | Admitting: Nurse Practitioner

## 2024-01-20 ENCOUNTER — Other Ambulatory Visit: Payer: Self-pay | Admitting: Internal Medicine

## 2024-01-20 VITALS — BP 130/78 | HR 91 | Wt 151.4 lb

## 2024-01-20 DIAGNOSIS — K760 Fatty (change of) liver, not elsewhere classified: Secondary | ICD-10-CM | POA: Diagnosis not present

## 2024-01-20 DIAGNOSIS — E785 Hyperlipidemia, unspecified: Secondary | ICD-10-CM

## 2024-01-20 DIAGNOSIS — E1149 Type 2 diabetes mellitus with other diabetic neurological complication: Secondary | ICD-10-CM | POA: Diagnosis not present

## 2024-01-20 DIAGNOSIS — I10 Essential (primary) hypertension: Secondary | ICD-10-CM | POA: Diagnosis not present

## 2024-01-20 DIAGNOSIS — E559 Vitamin D deficiency, unspecified: Secondary | ICD-10-CM | POA: Diagnosis not present

## 2024-01-20 DIAGNOSIS — M3320 Polymyositis, organ involvement unspecified: Secondary | ICD-10-CM

## 2024-01-20 DIAGNOSIS — E1169 Type 2 diabetes mellitus with other specified complication: Secondary | ICD-10-CM

## 2024-01-20 DIAGNOSIS — M797 Fibromyalgia: Secondary | ICD-10-CM | POA: Diagnosis not present

## 2024-01-20 DIAGNOSIS — E119 Type 2 diabetes mellitus without complications: Secondary | ICD-10-CM | POA: Diagnosis not present

## 2024-01-20 DIAGNOSIS — R21 Rash and other nonspecific skin eruption: Secondary | ICD-10-CM

## 2024-01-20 MED ORDER — FREESTYLE LIBRE 3 PLUS SENSOR MISC
11 refills | Status: AC
  Filled 2024-01-20: qty 2, 30d supply, fill #0
  Filled 2024-02-17: qty 2, 30d supply, fill #1
  Filled 2024-02-20 – 2024-03-11 (×2): qty 2, 30d supply, fill #2
  Filled 2024-04-28: qty 2, 30d supply, fill #3
  Filled 2024-05-18: qty 2, 30d supply, fill #4
  Filled 2024-06-24 – 2024-07-26 (×10): qty 2, 30d supply, fill #5
  Filled 2024-08-22: qty 2, 30d supply, fill #6
  Filled 2024-09-21: qty 2, 30d supply, fill #7
  Filled 2024-09-23 – 2024-10-20 (×2): qty 2, 30d supply, fill #8
  Filled 2024-11-16 – 2024-11-18 (×4): qty 2, 30d supply, fill #9

## 2024-01-20 MED ORDER — VITAMIN D (ERGOCALCIFEROL) 1.25 MG (50000 UNIT) PO CAPS
50000.0000 [IU] | ORAL_CAPSULE | ORAL | 3 refills | Status: DC
  Filled 2024-01-20 – 2024-03-11 (×6): qty 12, 84d supply, fill #0
  Filled 2024-05-30: qty 12, 84d supply, fill #1
  Filled 2024-07-25 – 2024-08-12 (×2): qty 12, 84d supply, fill #2
  Filled 2024-11-16: qty 12, 84d supply, fill #3

## 2024-01-20 NOTE — Assessment & Plan Note (Signed)
 Chronic diabetes managed with semaglutide. We discussed that it is acceptable to take her injection a few days late, if she forgets her dose. Today we went through the process of setting up a medication reminder on her iPhone so she can have an extra layer of alert to remember her medications. We also discussed that she can select that she took the medications in the event she has difficulty remembering later, this will serve as a reminder. I have changed her freestyle libre 2 to the 3plus version and sent this to the pharmacy. She will need to update the app, if required. Labs to be drawn in the next few days.

## 2024-01-20 NOTE — Assessment & Plan Note (Signed)
 Recent cessation of gabapentin and start of naltrexone with specialist. We discussed that the period of dizziness could have been from the medication change. Fortunately this has subsided. I am hopeful that the new medication will be an effective treatment for her pain and brain fog. I have encouraged her to allow at least 4 weeks of management to assess the benefit. Will monitor.

## 2024-01-20 NOTE — Patient Instructions (Signed)
 Add your medications to the Health App on your phone so that it will alarm you when it is time for your medications and you can mark it. This is helpful in case you forget if you took the medication it will remind you.

## 2024-01-20 NOTE — Assessment & Plan Note (Addendum)
 Managed with rosuvastatin and repatha with no concerns. She has had some difficulty with remembering the injection on time since it is not a daily medication. We walked through setting up medication reminders on her iPhone today to aid in reminding her to take this and her other medications appropriately. Continue current treatment. Fasting labs orders placed. She will have these completed in the next few days.

## 2024-01-20 NOTE — Assessment & Plan Note (Signed)
 Blood pressures are within goal parameters for age today with no alarm symptoms present. No medications

## 2024-01-20 NOTE — Progress Notes (Signed)
 Pam Clamp, DNP, AGNP-c [facility]  [address] [place], [place] [address] [phone]  ESTABLISHED PATIENT- Chronic Health and/or Follow-Up Visit  Blood pressure 130/78, pulse 91, weight 151 lb 6.4 oz (68.7 kg).    Pam [name] is a 73 y.o. year old female presenting today for evaluation and management of chronic conditions. She reports recently seeing a new specialist for management of her fibromyalgia, which has gotten worse in the last several months. She has been started on naltrexone for treatment and has been taking the medication for less than a week. She has not noticed much of a difference since starting the medication, but does tell me that she had noticeably improved sleep last night. She is hopeful the new regimen will be effective. She has stopped her gabapentin. Initially she had a few days of dizziness when starting the medication, but this has resolved. It is unclear if she stopped the gabapentin without tapering.   She has been using the freestyle libre for her diabetes monitoring, but reports difficulty keeping the meter in place at times. She did receive a letter that stated she will need to transition to the new libre 3 plus due to discontinuation of the 2. She reports being late on her injections of repatha and semaglutide fairly regularly due to forgetting to give the injection on the weekend.   She is still dealing with brain fog and memory concerns. She has recently misplaced her vitamin D prescription and her dog's medications as well. She has a person coming to her home to give her a cost of coming to help with decluttering. She is hopeful this will help with her stay better organized.   All ROS negative with exception of what is listed above.   PHYSICAL EXAM Physical Exam Vitals and nursing note reviewed.  Constitutional:      General: She is not in acute distress.    Appearance: Normal appearance. She is not ill-appearing.  HENT:      Head: Normocephalic.  Eyes:     Conjunctiva/sclera: Conjunctivae normal.  Neck:     Vascular: No carotid bruit.  Cardiovascular:     Rate and Rhythm: Normal rate and regular rhythm.     Pulses: Normal pulses.     Heart sounds: Normal heart sounds.  Pulmonary:     Effort: Pulmonary effort is normal.     Breath sounds: Normal breath sounds.  Musculoskeletal:     Cervical back: Normal range of motion. No tenderness.     Right lower leg: No edema.     Left lower leg: No edema.  Lymphadenopathy:     Cervical: No cervical adenopathy.  Skin:    General: Skin is warm and dry.     Capillary Refill: Capillary refill takes less than 2 seconds.  Neurological:     Mental Status: She is alert. Mental status is at baseline.     Gait: Gait abnormal.     Comments: Utilizing a cane for ambulation.   Psychiatric:        Mood and Affect: Mood normal.        Behavior: Behavior normal.      PLAN Problem List Items Addressed This Visit     Fibromyalgia   Recent cessation of gabapentin and start of naltrexone with specialist. We discussed that the period of dizziness could have been from the medication change. Fortunately this has subsided. I am hopeful that the new medication will be an effective treatment for her pain and brain  fog. I have encouraged her to allow at least 4 weeks of management to assess the benefit. Will monitor.       Relevant Medications   NALTREXONE HCL, PAIN, PO   Type 2 diabetes mellitus with neurological complications (HCC)   Chronic diabetes managed with semaglutide. We discussed that it is acceptable to take her injection a few days late, if she forgets her dose. Today we went through the process of setting up a medication reminder on her iPhone so she can have an extra layer of alert to remember her medications. We also discussed that she can select that she took the medications in the event she has difficulty remembering later, this will serve as a reminder. I have  changed her freestyle libre 2 to the 3plus version and sent this to the pharmacy. She will need to update the app, if required. Labs to be drawn in the next few days.       Relevant Medications   Continuous Glucose Sensor (FREESTYLE LIBRE 3 PLUS SENSOR) MISC   Other Relevant Orders   Hemoglobin A1c   CBC with Differential/Platelet   Comprehensive metabolic panel   Hyperlipidemia associated with type 2 diabetes mellitus (HCC)   Managed with rosuvastatin and repatha with no concerns. She has had some difficulty with remembering the injection on time since it is not a daily medication. We walked through setting up medication reminders on her iPhone today to aid in reminding her to take this and her other medications appropriately. Continue current treatment. Fasting labs orders placed. She will have these completed in the next few days.       Relevant Orders   LP+LDL Direct   Hypertension complicating diabetes (HCC)   Blood pressures are within goal parameters for age today with no alarm symptoms present. No medications      NAFLD (nonalcoholic fatty liver disease)   Polymyositis (HCC)   Other Visit Diagnoses       Vitamin D deficiency    -  Primary   Relevant Medications   Vitamin D, Ergocalciferol, (DRISDOL) 1.25 MG (50000 UNIT) CAPS capsule   Other Relevant Orders   VITAMIN D 25 Hydroxy (Vit-D Deficiency, Fractures)       Return in about 6 months (around 07/22/2024) for Med Management 44.  Pam Clamp, DNP, AGNP-c

## 2024-01-26 ENCOUNTER — Other Ambulatory Visit (HOSPITAL_COMMUNITY): Payer: Self-pay

## 2024-01-27 ENCOUNTER — Other Ambulatory Visit (HOSPITAL_COMMUNITY): Payer: Self-pay

## 2024-01-27 ENCOUNTER — Other Ambulatory Visit: Payer: Self-pay

## 2024-01-27 ENCOUNTER — Ambulatory Visit: Payer: Self-pay | Admitting: Nurse Practitioner

## 2024-01-27 MED ORDER — FREESTYLE LIBRE 2 SENSOR MISC
11 refills | Status: DC
  Filled 2024-01-27: qty 2, 28d supply, fill #0

## 2024-01-27 MED ORDER — ERGOCALCIFEROL 1.25 MG (50000 UT) PO CAPS
50000.0000 [IU] | ORAL_CAPSULE | ORAL | 1 refills | Status: DC
Start: 2023-12-30 — End: 2024-02-16
  Filled 2024-01-27 – 2024-02-05 (×2): qty 12, 84d supply, fill #0

## 2024-01-27 MED ORDER — GABAPENTIN 600 MG PO TABS
600.0000 mg | ORAL_TABLET | Freq: Every day | ORAL | 2 refills | Status: DC
  Filled 2024-01-27: qty 90, 90d supply, fill #0

## 2024-01-27 MED FILL — Evolocumab Subcutaneous Soln Auto-Injector 140 MG/ML: SUBCUTANEOUS | 84 days supply | Qty: 6 | Fill #0 | Status: AC

## 2024-01-27 MED FILL — Albuterol Sulfate Inhal Aero 108 MCG/ACT (90MCG Base Equiv): RESPIRATORY_TRACT | 34 days supply | Qty: 18 | Fill #0 | Status: CN

## 2024-01-27 MED FILL — Levocetirizine Dihydrochloride Tab 5 MG: ORAL | 47 days supply | Qty: 47 | Fill #0 | Status: CN

## 2024-01-27 NOTE — Telephone Encounter (Signed)
 Call could not be completed as dialed.   Copied from CRM [id number]. Topic: Clinical - Medical Advice >> Jan 27, 2024  4:01 PM Patsy Lager T wrote: Reason for CRM: patient called wants the nurse to call to let her know if she needs to go to ER or not as he sugar levels keep going from the high 70's down to the 50's and this is after she has taken the tablets. Please f/u with patient

## 2024-01-27 NOTE — Telephone Encounter (Addendum)
 3rd attempt, "call cannot be completed as dialed" message. Routing to clinic for follow up.  2nd attempt, "call cannot be completed as dialed" message.

## 2024-01-28 ENCOUNTER — Other Ambulatory Visit (HOSPITAL_COMMUNITY): Payer: Self-pay

## 2024-01-28 ENCOUNTER — Encounter: Payer: Self-pay | Admitting: Nurse Practitioner

## 2024-01-28 ENCOUNTER — Other Ambulatory Visit: Payer: Self-pay

## 2024-01-28 ENCOUNTER — Encounter: Payer: Self-pay | Admitting: Pharmacist

## 2024-01-28 NOTE — Telephone Encounter (Signed)
 Please call Dr Miquel Dunn.  If no answer, please send a [facility] message.   Please ask the following: Are the readings based on the continuous monitor or a fingerstick? If the CGM, we need to make sure this is an accurate reading. It may be that her CGM has come out of the tissue.   Is she having any symptoms during this time?  She is not on any medications that would be expected to cause hypoglycemia.  Has she been eating at least 3 meals a day with good protein content?   It sounds more like the CGM has dislodged. This will need to be replaced if she is getting these readings only on the meter.   If she has any low readings, she should always double check with a fingerstick.   If she is getting low's on a fingerstick and has symptoms and cannot get her blood sugar up, then she will need to go to the ED.

## 2024-01-29 ENCOUNTER — Other Ambulatory Visit (HOSPITAL_COMMUNITY): Payer: Self-pay

## 2024-02-06 ENCOUNTER — Encounter: Payer: Medicare PPO | Attending: Physical Medicine and Rehabilitation | Admitting: Physical Medicine & Rehabilitation

## 2024-02-06 ENCOUNTER — Other Ambulatory Visit: Payer: Self-pay

## 2024-02-06 ENCOUNTER — Encounter: Payer: Self-pay | Admitting: Physical Medicine & Rehabilitation

## 2024-02-06 ENCOUNTER — Other Ambulatory Visit (HOSPITAL_COMMUNITY): Payer: Self-pay

## 2024-02-06 VITALS — BP 155/69 | HR 88 | Ht 62.0 in

## 2024-02-06 DIAGNOSIS — M797 Fibromyalgia: Secondary | ICD-10-CM | POA: Diagnosis not present

## 2024-02-06 DIAGNOSIS — E1169 Type 2 diabetes mellitus with other specified complication: Secondary | ICD-10-CM | POA: Diagnosis not present

## 2024-02-06 DIAGNOSIS — M47816 Spondylosis without myelopathy or radiculopathy, lumbar region: Secondary | ICD-10-CM | POA: Diagnosis not present

## 2024-02-06 DIAGNOSIS — E1149 Type 2 diabetes mellitus with other diabetic neurological complication: Secondary | ICD-10-CM | POA: Diagnosis not present

## 2024-02-06 DIAGNOSIS — E559 Vitamin D deficiency, unspecified: Secondary | ICD-10-CM | POA: Diagnosis not present

## 2024-02-06 DIAGNOSIS — E785 Hyperlipidemia, unspecified: Secondary | ICD-10-CM | POA: Diagnosis not present

## 2024-02-06 NOTE — Progress Notes (Signed)
 73 year old female with history of fibromyalgia syndrome and widespread body pain.  In addition she has a history of lumbar spondylosis.  Reviewed MRI from 2015 showing fairly severe lumbar facet arthropathy L1-L5. She is here for acupuncture evaluation.  She has pain sensitivity all over her body but she also has areas of pain in her neck as well as her low back. She has had acupuncture as well as electroacupuncture in the past but it has been several years since she last had it.  She noted no problems with the procedure.  Treatment Bilateral L4 with electrical stimulation to bilateral SP 6 In addition LV 3 bilateral without electrical stimulation Treatment time 25 minutes, stimulation frequency 2 Hz Will see her back in 2 to 3 weeks may add ST 36

## 2024-02-07 LAB — COMPREHENSIVE METABOLIC PANEL WITH GFR
ALT: 10 IU/L (ref 0–32)
AST: 18 IU/L (ref 0–40)
Albumin: 4.3 g/dL (ref 3.8–4.8)
Alkaline Phosphatase: 74 IU/L (ref 44–121)
BUN/Creatinine Ratio: 22 (ref 12–28)
BUN: 14 mg/dL (ref 8–27)
Bilirubin Total: <0.2 mg/dL (ref 0.0–1.2)
CO2: 25 mmol/L (ref 20–29)
Calcium: 9.7 mg/dL (ref 8.7–10.3)
Chloride: 103 mmol/L (ref 96–106)
Creatinine, Ser: 0.65 mg/dL (ref 0.57–1.00)
Globulin, Total: 2.8 g/dL (ref 1.5–4.5)
Glucose: 85 mg/dL (ref 70–99)
Potassium: 4.5 mmol/L (ref 3.5–5.2)
Sodium: 142 mmol/L (ref 134–144)
Total Protein: 7.1 g/dL (ref 6.0–8.5)
eGFR: 93 mL/min/{1.73_m2} (ref >59)

## 2024-02-07 LAB — CBC WITH DIFFERENTIAL/PLATELET
Basophils Absolute: 0 10*3/uL (ref 0.0–0.2)
Basos: 1 %
EOS (ABSOLUTE): 0.1 10*3/uL (ref 0.0–0.4)
Eos: 2 %
Hematocrit: 39.9 % (ref 34.0–46.6)
Hemoglobin: 13 g/dL (ref 11.1–15.9)
Immature Grans (Abs): 0 10*3/uL (ref 0.0–0.1)
Immature Granulocytes: 0 %
Lymphocytes Absolute: 2.3 10*3/uL (ref 0.7–3.1)
Lymphs: 50 %
MCH: 26.6 pg (ref 26.6–33.0)
MCHC: 32.6 g/dL (ref 31.5–35.7)
MCV: 82 fL (ref 79–97)
Monocytes Absolute: 0.3 10*3/uL (ref 0.1–0.9)
Monocytes: 7 %
Neutrophils Absolute: 1.9 10*3/uL (ref 1.4–7.0)
Neutrophils: 40 %
Platelets: 262 10*3/uL (ref 150–450)
RBC: 4.89 x10E6/uL (ref 3.77–5.28)
RDW: 13.6 % (ref 11.7–15.4)
WBC: 4.6 10*3/uL (ref 3.4–10.8)

## 2024-02-07 LAB — VITAMIN D 25 HYDROXY (VIT D DEFICIENCY, FRACTURES): Vit D, 25-Hydroxy: 71.3 ng/mL (ref 30.0–100.0)

## 2024-02-07 LAB — LP+LDL DIRECT
Cholesterol, Total: 120 mg/dL (ref 100–199)
HDL: 46 mg/dL (ref >39)
LDL Chol Calc (NIH): 54 mg/dL (ref 0–99)
LDL Direct: 56 mg/dL (ref 0–99)
Triglycerides: 111 mg/dL (ref 0–149)
VLDL Cholesterol Cal: 20 mg/dL (ref 5–40)

## 2024-02-07 LAB — HEMOGLOBIN A1C
Est. average glucose Bld gHb Est-mCnc: 128 mg/dL
Hgb A1c MFr Bld: 6.1 % — ABNORMAL HIGH (ref 4.8–5.6)

## 2024-02-08 ENCOUNTER — Encounter: Payer: Self-pay | Admitting: Nurse Practitioner

## 2024-02-15 NOTE — Progress Notes (Unsigned)
 Cardiology Office Note:  .   Date:  02/16/2024  ID:  Pam [name], DOB October 12, 1951, MRN [id number] PCP: [name], NP  [facility] Providers Cardiologist:  [name], MD    History of Present Illness: Pam [name] is a 73 y.o. female  with a hx of CAD, DM 2, COPD, HLD, FM, diabetic neuropathy, fibromyalgia, chronic dizziness with BPPV.    Longstanding history of dizziness leading to negative MRI of brain December 2022.  Admitted 9/25 - 08/06/2022 with syncope and chest pain.  Syncope consistent with vertigo or vagal component. ZIO no arrhythmia. CT head/MRI brain negative acute stroke. Echo 08/06/22 normal LVEF 60 to 65%, gr1dd, mitral annular calcification without regurgitation. Subsequent outpatient cardiac CTA 08/26/22 coronary calcium score of 65 placing her in the 82nd percentile for age and gender suggesting high risk for future cardiac events.  Noted moderate stenosis in mid D1 of borderline hemodynamic significance with FFR of 0.76 and nonobstructive disease in the LAD and dominant LCx.  Was recommended for medical management by Dr. Herbie Baltimore given distal location of borderline lesion.  Last seen 11/14/23 with concerns about fibromyalgia management, she was referred to physical medicine rehab and established with Dr. Carlis Abbott. Her BP was elevated in clinic in setting of pain and encouraged to monitor at home as readings at vestibular therapy with SBP 110-130s. Her present anithypertensive regimen was continued.    Presents today for follow up. Pleasant lady who lives at home with her dog. 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 She is struggling to get to the gym d/t cost of [employer] Goad, and has applied for transportation through [employer]. She is currently working on practicing driving with her neighbor. She completed vestibular therapy which has helped, but she is still only having trouble with dizziness when walking her dog. She is going to water therapy next month to help with her  fibromyalgia pain and is still working with acupuncture through [facility]. She took her BP a few times early March with readings in the 120's/60's. Reports no shortness of breath nor dyspnea on exertion. Reports no chest pain, pressure, or tightness. No edema, orthopnea, PND. Reports no palpitations.    ROS: Please see the history of present illness.    All other systems reviewed and are negative.   Studies Reviewed: .           Risk Assessment/Calculations:            Physical Exam:   VS:  BP 110/64 (BP Location: Left Arm, Patient Position: Sitting, Cuff Size: Normal)   Pulse 85   Ht 5\' 1"  (1.549 m)   Wt 147 lb (66.7 kg)   SpO2 98%   BMI 27.78 kg/m    Wt Readings from Last 3 Encounters:  02/16/24 147 lb (66.7 kg)  01/20/24 151 lb 6.4 oz (68.7 kg)  01/09/24 150 lb (68 kg)    GEN: Well nourished, well developed in no acute distress NECK: No JVD; No carotid bruits CARDIAC: RRR, no murmurs, rubs, gallops RESPIRATORY:  Clear to auscultation without rales, wheezing or rhonchi  ABDOMEN: Soft, non-tender, non-distended EXTREMITIES:  No edema; No deformity   ASSESSMENT AND PLAN: .    CAD/HLD, LDL goal less than 70 - 08/26/22 calcium score 65 with moderate stenosis in mid D1 of borderline hemodynamic significance (FFR 0.76), nonobstructive disease in LAD and LCx. Recommended for medical management. No anginal symptoms. 02/06/24 Total cholesterol 120, LDL 54, HDL 46, and triglycerides  111. GDMT: aspirin 81 mg, Repatha 140 mg , and rosuvastatin 5 mg M, W, F.  Does note stress regarding having to do Repatha injection. Will reach out to PharmD about possibility of Leqvio with her present insurance coverage. She just picked up a prescription so pending insurance coverage, could make transition at next office visit.   Fibromyalgia - Follows with Physical Medicine Rehab. She has been working with acupuncture and is starting water therapy next month.   Vertigo - Established  with ENT for vestibular evaluation. MRI brain, CT angio head/neck 08/2023 unremarkable. Completed vestibular physical therapy.    HTN - She has not been checking BP at home often. Early march BP was 120's/60's. She is agreeable to check her BP twice weekly. No indication to add antihypertensive at this time as BP controlled in clinie. Marland Kitchen Discussed to monitor BP at home at least 2 hours after medications and sitting for 5-10 minutes.   DM2 / Diabetic neuropathy - Followed by PCP. 02/06/24 A1C 6.1%. She wants to be more active and is trying to get transportation so she is able to add more physical activity by going to the gym. Recommend aiming for 150 minutes of moderate intensity activity per week and following a heart healthy diet.        Dispo: follow up in 6 months.   Signed, Alver Sorrow, NP

## 2024-02-16 ENCOUNTER — Ambulatory Visit (HOSPITAL_BASED_OUTPATIENT_CLINIC_OR_DEPARTMENT_OTHER): Payer: Medicare PPO | Admitting: Family

## 2024-02-16 ENCOUNTER — Encounter (HOSPITAL_BASED_OUTPATIENT_CLINIC_OR_DEPARTMENT_OTHER): Payer: Self-pay | Admitting: Family

## 2024-02-16 VITALS — BP 110/64 | HR 85 | Ht 61.0 in | Wt 147.0 lb

## 2024-02-16 DIAGNOSIS — I25118 Atherosclerotic heart disease of native coronary artery with other forms of angina pectoris: Secondary | ICD-10-CM | POA: Diagnosis not present

## 2024-02-16 DIAGNOSIS — E785 Hyperlipidemia, unspecified: Secondary | ICD-10-CM | POA: Diagnosis not present

## 2024-02-16 DIAGNOSIS — I10 Essential (primary) hypertension: Secondary | ICD-10-CM

## 2024-02-16 NOTE — Patient Instructions (Addendum)
 Medication Instructions:  Continue your current medications  *If you need a refill on your cardiac medications before your next appointment, please call your pharmacy*  Follow-Up: At [facility], you and your health needs are our priority.  As part of our continuing mission to provide you with exceptional heart care, our providers are all part of one team.  This team includes your primary Cardiologist (physician) and Advanced Practice Providers or APPs (Physician Assistants and Nurse Practitioners) who all work together to provide you with the care you need, when you need it.  Your next appointment:   6 month(s)  Provider:   [name], MD, Eligha Bridegroom, NP, or Gillian Shields, NP    We recommend signing up for the patient portal called "MyChart".  Sign up information is provided on this After Visit Summary.  MyChart is used to connect with patients for Virtual Visits (Telemedicine).  Patients are able to view lab/test results, encounter notes, upcoming appointments, etc.  Non-urgent messages can be sent to your provider as well.   To learn more about what you can do with MyChart, go to [email or web address].

## 2024-02-17 ENCOUNTER — Ambulatory Visit: Payer: Medicare PPO

## 2024-02-17 ENCOUNTER — Other Ambulatory Visit: Payer: Self-pay

## 2024-02-17 ENCOUNTER — Other Ambulatory Visit (HOSPITAL_COMMUNITY): Payer: Self-pay

## 2024-02-17 DIAGNOSIS — Z0000 Encounter for general adult medical examination without abnormal findings: Secondary | ICD-10-CM

## 2024-02-17 NOTE — Patient Instructions (Signed)
 Pam [name] , Thank you for taking time to come for your Medicare Wellness Visit. I appreciate your ongoing commitment to your health goals. Please review the following plan we discussed and let me know if I can assist you in the future.   Referrals/Orders/Follow-Ups/Clinician Recommendations: none  This is a list of the screening recommended for you and due dates:  Health Maintenance  Topic Date Due   Zoster (Shingles) Vaccine (1 of 2) Never done   DTaP/Tdap/Td vaccine (2 - Td or Tdap) 03/11/2014   Pneumonia Vaccine (2 of 2 - PPSV23 or PCV20) 09/30/2024*   COVID-19 Vaccine (4 - Mixed Product risk 2024-25 season) 03/30/2024   Flu Shot  06/11/2024   Yearly kidney health urinalysis for diabetes  07/01/2024   Hemoglobin A1C  08/08/2024   Eye exam for diabetics  09/24/2024   Complete foot exam   01/19/2025   Yearly kidney function blood test for diabetes  02/05/2025   Medicare Annual Wellness Visit  02/16/2025   Mammogram  04/20/2025   Colon Cancer Screening  09/27/2026   DEXA scan (bone density measurement)  Completed   Hepatitis C Screening  Completed   HPV Vaccine  Aged Out  *Topic was postponed. The date shown is not the original due date.    Advanced directives: (ACP Link)Information on Advanced Care Planning can be found at [facility] Advance Health Care Directives Advance Health Care Directives. [email or web address]   Next Medicare Annual Wellness Visit scheduled for next year: Yes  insert Preventive Care attachment Insert FALL PREVENTION attachment if needed
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
## 2024-02-18 ENCOUNTER — Other Ambulatory Visit (HOSPITAL_COMMUNITY): Payer: Self-pay

## 2024-02-20 ENCOUNTER — Telehealth (HOSPITAL_BASED_OUTPATIENT_CLINIC_OR_DEPARTMENT_OTHER): Payer: Self-pay | Admitting: Physical Therapy

## 2024-02-20 MED FILL — Levocetirizine Dihydrochloride Tab 5 MG: ORAL | 47 days supply | Qty: 47 | Fill #0 | Status: AC

## 2024-02-20 NOTE — Telephone Encounter (Signed)
 LVM requesting patient call back to schedule evaluation appointmentCalled and spoke to patient to remind patient of upcoming physical therapy evaluation appointment. Pt confirmed appt and will be in attendance.

## 2024-02-21 ENCOUNTER — Other Ambulatory Visit (HOSPITAL_COMMUNITY): Payer: Self-pay

## 2024-02-21 ENCOUNTER — Other Ambulatory Visit (HOSPITAL_BASED_OUTPATIENT_CLINIC_OR_DEPARTMENT_OTHER): Payer: Self-pay

## 2024-02-23 ENCOUNTER — Other Ambulatory Visit (HOSPITAL_COMMUNITY): Payer: Self-pay

## 2024-02-23 ENCOUNTER — Other Ambulatory Visit: Payer: Self-pay

## 2024-02-23 ENCOUNTER — Encounter (HOSPITAL_BASED_OUTPATIENT_CLINIC_OR_DEPARTMENT_OTHER): Payer: Self-pay | Admitting: Physical Therapy

## 2024-02-23 ENCOUNTER — Ambulatory Visit (HOSPITAL_BASED_OUTPATIENT_CLINIC_OR_DEPARTMENT_OTHER): Attending: Physical Medicine and Rehabilitation | Admitting: Physical Therapy

## 2024-02-23 DIAGNOSIS — M5459 Other low back pain: Secondary | ICD-10-CM | POA: Insufficient documentation

## 2024-02-23 DIAGNOSIS — R2681 Unsteadiness on feet: Secondary | ICD-10-CM | POA: Diagnosis not present

## 2024-02-23 DIAGNOSIS — M797 Fibromyalgia: Secondary | ICD-10-CM | POA: Diagnosis not present
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
## 2024-02-25 ENCOUNTER — Other Ambulatory Visit (HOSPITAL_COMMUNITY): Payer: Self-pay

## 2024-03-11 ENCOUNTER — Other Ambulatory Visit (HOSPITAL_BASED_OUTPATIENT_CLINIC_OR_DEPARTMENT_OTHER): Payer: Self-pay

## 2024-03-11 ENCOUNTER — Encounter: Payer: Medicare PPO | Admitting: Physical Medicine and Rehabilitation

## 2024-03-11 ENCOUNTER — Other Ambulatory Visit (HOSPITAL_COMMUNITY): Payer: Self-pay

## 2024-03-12 ENCOUNTER — Encounter: Payer: Self-pay | Admitting: Physical Medicine & Rehabilitation

## 2024-03-12 ENCOUNTER — Encounter: Attending: Physical Medicine and Rehabilitation | Admitting: Physical Medicine & Rehabilitation

## 2024-03-12 VITALS — BP 168/72 | HR 74 | Temp 98.8°F | Ht 61.0 in | Wt 148.4 lb

## 2024-03-12 DIAGNOSIS — M47816 Spondylosis without myelopathy or radiculopathy, lumbar region: Secondary | ICD-10-CM | POA: Insufficient documentation

## 2024-03-12 DIAGNOSIS — M797 Fibromyalgia: Secondary | ICD-10-CM | POA: Insufficient documentation

## 2024-03-12 NOTE — Progress Notes (Signed)
 73 year old female with history of fibromyalgia syndrome and widespread body pain.  In addition she has a history of lumbar spondylosis.  Reviewed MRI from 2015 showing fairly severe lumbar facet arthropathy L1-L5. She is here for acupunctureTreatment #2.  Treatment Bilateral LI4 with electrical stimulation to bilateral SP 6 In addition LV 3 & ST 36 bilateral without electrical stimulation Treatment time 25 minutes, stimulation frequency 2 Hz

## 2024-03-12 NOTE — Patient Instructions (Signed)
 Acupuncture Acupuncture is a type of treatment that involves stimulating specific points on your body by inserting thin needles through your skin. Acupuncture is often used to treat pain, but it may also be used to help relieve other types of symptoms. Your health care provider may recommend acupuncture to help treat various conditions, such as: Migraine and tension headaches. Nausea and vomiting after a surgery or cancer treatment. Sudden or severe (acute) pain, or long-term (chronic) pain. Addiction. Acupuncture is based on traditional [place] medicine, which recognizes more than 2,000 points on the body that connect energy pathways (meridians) through the body. The goal in stimulating these points is to balance the physical, emotional, and mental energy in your body. Acupuncture is done by a health care provider who has specialized training (licensed acupuncture practitioner). Treatment often requires several acupuncture sessions. You may have acupuncture along with other medical treatments. Tell a health care provider about: Any allergies you have. All medicines you are taking, including vitamins, herbs, eye drops, creams, and over-the-counter medicines. Any blood disorders you have. Any surgeries you have had. Any medical conditions you have. Whether you are pregnant or may be pregnant. What are the risks? Generally, this is a safe treatment. However, problems may occur, including: Skin infection. Damage to organs or structures that are under the skin if a needle is placed too deeply. This is rare. What happens before the treatment? Your acupuncture practitioner will ask about your medical history and your symptoms. You may have a physical exam. What happens during the treatment? The exact procedure will depend on your condition and how your acupuncture provider treats it. In general: Your skin will be cleaned with a germ-killing (antiseptic) solution. Your acupuncture practitioner will  open a new set of germ-free (sterile) needles. The needles will be gently inserted into your skin. They will be left in place for a certain amount of time. You may feel a tingling or burning sensation for a very short period of time. Your acupuncture practitioner may: Apply electrical energy to the needles. Adjust the needles in certain ways. After your procedure, the acupuncture practitioner will remove the needles, throw them away, and clean your skin. The procedure may vary among health care providers. What can I expect after the treatment? People react differently to acupuncture. Make sure you ask your acupuncture provider what to expect after your treatment. It is common to have: Minor bruising. Mild pain. A small amount of bleeding. Follow these instructions at home: Follow any instructions given by your provider after the treatment. Keep all follow-up visits. This is important. Where to find more information [facility]: [email or web address] Contact a health care provider if: You have questions about your reaction to the treatment. You have soreness. You have skin irritation or redness. You have a fever. Summary Acupuncture is a type of treatment that involves stimulating specific points on your body by inserting thin needles through your skin. This treatment is often used to treat pain, but it may also be used to help relieve other types of symptoms. The exact procedure will depend on your condition and how your acupuncture provider treats it. This information is not intended to replace advice given to you by your health care provider. Make sure you discuss any questions you have with your health care provider. Document Revised: 07/04/2021 Document Reviewed: 07/04/2021 Elsevier Patient Education  2024 [employer].

## 2024-03-18 ENCOUNTER — Encounter (HOSPITAL_BASED_OUTPATIENT_CLINIC_OR_DEPARTMENT_OTHER): Payer: Self-pay | Admitting: Physical Therapy

## 2024-03-18 ENCOUNTER — Ambulatory Visit (HOSPITAL_BASED_OUTPATIENT_CLINIC_OR_DEPARTMENT_OTHER): Attending: Physical Medicine and Rehabilitation | Admitting: Physical Therapy

## 2024-03-18 DIAGNOSIS — M5459 Other low back pain: Secondary | ICD-10-CM | POA: Insufficient documentation

## 2024-03-18 DIAGNOSIS — R2681 Unsteadiness on feet: Secondary | ICD-10-CM | POA: Diagnosis not present

## 2024-03-18 DIAGNOSIS — M6281 Muscle weakness (generalized): Secondary | ICD-10-CM | POA: Insufficient documentation

## 2024-03-18 DIAGNOSIS — R42 Dizziness and giddiness: Secondary | ICD-10-CM | POA: Insufficient documentation
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
## 2024-03-23 ENCOUNTER — Ambulatory Visit (HOSPITAL_BASED_OUTPATIENT_CLINIC_OR_DEPARTMENT_OTHER): Admitting: Physical Therapy

## 2024-03-23 DIAGNOSIS — F0670 Mild neurocognitive disorder due to known physiological condition without behavioral disturbance: Secondary | ICD-10-CM | POA: Insufficient documentation

## 2024-03-23 DIAGNOSIS — K625 Hemorrhage of anus and rectum: Secondary | ICD-10-CM | POA: Diagnosis not present

## 2024-03-23 DIAGNOSIS — Z1211 Encounter for screening for malignant neoplasm of colon: Secondary | ICD-10-CM | POA: Diagnosis not present

## 2024-03-23 DIAGNOSIS — F330 Major depressive disorder, recurrent, mild: Secondary | ICD-10-CM | POA: Insufficient documentation

## 2024-03-23 DIAGNOSIS — R151 Fecal smearing: Secondary | ICD-10-CM | POA: Diagnosis not present

## 2024-03-24 ENCOUNTER — Ambulatory Visit (HOSPITAL_BASED_OUTPATIENT_CLINIC_OR_DEPARTMENT_OTHER): Payer: Self-pay | Admitting: Physical Therapy

## 2024-03-24 DIAGNOSIS — R2681 Unsteadiness on feet: Secondary | ICD-10-CM

## 2024-03-24 DIAGNOSIS — M5459 Other low back pain: Secondary | ICD-10-CM | POA: Diagnosis not present

## 2024-03-24 DIAGNOSIS — M6281 Muscle weakness (generalized): Secondary | ICD-10-CM | POA: Diagnosis not present

## 2024-03-24 DIAGNOSIS — R42 Dizziness and giddiness: Secondary | ICD-10-CM | POA: Diagnosis not present
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
## 2024-03-25 DIAGNOSIS — F330 Major depressive disorder, recurrent, mild: Secondary | ICD-10-CM | POA: Diagnosis not present

## 2024-03-25 DIAGNOSIS — G3184 Mild cognitive impairment, so stated: Secondary | ICD-10-CM | POA: Diagnosis not present

## 2024-03-29 ENCOUNTER — Telehealth: Payer: Self-pay

## 2024-03-29 NOTE — Telephone Encounter (Signed)
 Can you add to your last note on this patient and she tried and failed Duloxetine , Lyrica, Gabapentin . Duloxetine -therapeutic failure Lyrica-delirious  Gabapentin -did not feel herself So I can appeal her Qutenza

## 2024-03-30 ENCOUNTER — Encounter (HOSPITAL_BASED_OUTPATIENT_CLINIC_OR_DEPARTMENT_OTHER): Payer: Self-pay | Admitting: Physical Therapy

## 2024-03-30 ENCOUNTER — Ambulatory Visit (HOSPITAL_BASED_OUTPATIENT_CLINIC_OR_DEPARTMENT_OTHER): Admitting: Physical Therapy

## 2024-03-30 DIAGNOSIS — R42 Dizziness and giddiness: Secondary | ICD-10-CM | POA: Diagnosis not present

## 2024-03-30 DIAGNOSIS — R2681 Unsteadiness on feet: Secondary | ICD-10-CM | POA: Diagnosis not present

## 2024-03-30 DIAGNOSIS — M5459 Other low back pain: Secondary | ICD-10-CM | POA: Diagnosis not present

## 2024-03-30 DIAGNOSIS — M6281 Muscle weakness (generalized): Secondary | ICD-10-CM | POA: Diagnosis not present
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
## 2024-04-04 ENCOUNTER — Other Ambulatory Visit: Payer: Self-pay | Admitting: Family Medicine

## 2024-04-06 ENCOUNTER — Ambulatory Visit (HOSPITAL_BASED_OUTPATIENT_CLINIC_OR_DEPARTMENT_OTHER): Admitting: Physical Therapy

## 2024-04-06 ENCOUNTER — Encounter (HOSPITAL_BASED_OUTPATIENT_CLINIC_OR_DEPARTMENT_OTHER): Payer: Self-pay | Admitting: Physical Therapy

## 2024-04-06 DIAGNOSIS — R2681 Unsteadiness on feet: Secondary | ICD-10-CM

## 2024-04-06 DIAGNOSIS — R42 Dizziness and giddiness: Secondary | ICD-10-CM

## 2024-04-06 DIAGNOSIS — M5459 Other low back pain: Secondary | ICD-10-CM

## 2024-04-06 NOTE — Telephone Encounter (Signed)
 Faxed appeal
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
## 2024-04-16 ENCOUNTER — Encounter: Attending: Physical Medicine and Rehabilitation | Admitting: Physical Medicine & Rehabilitation

## 2024-04-17 ENCOUNTER — Other Ambulatory Visit: Payer: Self-pay | Admitting: Internal Medicine

## 2024-04-19 ENCOUNTER — Other Ambulatory Visit (HOSPITAL_COMMUNITY): Payer: Self-pay

## 2024-04-19 ENCOUNTER — Other Ambulatory Visit: Payer: Self-pay

## 2024-04-20 NOTE — Telephone Encounter (Signed)
 Received refill request from  pharmacy for symbicort  and levocetirizine sent in denial with message that patient needs visit. Last seen by Dr Cornel Diesel 04/09/2023 advised to follow up in 4-6 months. It is past one year and no further visit on the books. Needs visit we can not authorize even a courtesy.

## 2024-04-28 ENCOUNTER — Other Ambulatory Visit (HOSPITAL_COMMUNITY): Payer: Self-pay

## 2024-04-28 ENCOUNTER — Other Ambulatory Visit: Payer: Self-pay

## 2024-04-28 ENCOUNTER — Other Ambulatory Visit: Payer: Self-pay | Admitting: Internal Medicine

## 2024-04-28 MED ORDER — SYMBICORT 160-4.5 MCG/ACT IN AERO
2.0000 | INHALATION_SPRAY | Freq: Two times a day (BID) | RESPIRATORY_TRACT | 5 refills | Status: DC
Start: 2024-04-28 — End: 2024-05-07
  Filled 2024-04-28: qty 10.2, 30d supply, fill #0

## 2024-04-28 MED ORDER — LEVOCETIRIZINE DIHYDROCHLORIDE 5 MG PO TABS
5.0000 mg | ORAL_TABLET | Freq: Every evening | ORAL | 0 refills | Status: DC
  Filled 2024-04-28: qty 90, 90d supply, fill #0

## 2024-04-28 MED FILL — Albuterol Sulfate Inhal Aero 108 MCG/ACT (90MCG Base Equiv): RESPIRATORY_TRACT | 33 days supply | Qty: 18 | Fill #0 | Status: AC

## 2024-04-29 ENCOUNTER — Other Ambulatory Visit: Payer: Self-pay

## 2024-05-03 ENCOUNTER — Encounter: Attending: Physical Medicine and Rehabilitation | Admitting: Physical Medicine and Rehabilitation

## 2024-05-03 DIAGNOSIS — M47816 Spondylosis without myelopathy or radiculopathy, lumbar region: Secondary | ICD-10-CM | POA: Insufficient documentation

## 2024-05-03 DIAGNOSIS — M797 Fibromyalgia: Secondary | ICD-10-CM | POA: Insufficient documentation

## 2024-05-07 ENCOUNTER — Other Ambulatory Visit (HOSPITAL_COMMUNITY): Payer: Self-pay

## 2024-05-07 ENCOUNTER — Other Ambulatory Visit: Payer: Self-pay

## 2024-05-07 ENCOUNTER — Encounter: Payer: Self-pay | Admitting: Family Medicine

## 2024-05-07 ENCOUNTER — Ambulatory Visit: Admitting: Family Medicine

## 2024-05-07 VITALS — BP 110/80 | HR 86 | Resp 18 | Ht 61.02 in | Wt 149.1 lb

## 2024-05-07 DIAGNOSIS — J3089 Other allergic rhinitis: Secondary | ICD-10-CM | POA: Diagnosis not present

## 2024-05-07 DIAGNOSIS — J302 Other seasonal allergic rhinitis: Secondary | ICD-10-CM

## 2024-05-07 DIAGNOSIS — J4541 Moderate persistent asthma with (acute) exacerbation: Secondary | ICD-10-CM | POA: Diagnosis not present

## 2024-05-07 DIAGNOSIS — R42 Dizziness and giddiness: Secondary | ICD-10-CM

## 2024-05-07 DIAGNOSIS — J4540 Moderate persistent asthma, uncomplicated: Secondary | ICD-10-CM

## 2024-05-07 MED ORDER — BUDESONIDE-FORMOTEROL FUMARATE 160-4.5 MCG/ACT IN AERO
2.0000 | INHALATION_SPRAY | Freq: Two times a day (BID) | RESPIRATORY_TRACT | 5 refills | Status: DC
  Filled 2024-05-07: qty 1, fill #0
  Filled 2024-05-30: qty 10.2, 30d supply, fill #0
  Filled 2024-06-28: qty 10.2, 30d supply, fill #1
  Filled 2024-07-25 – 2024-07-26 (×2): qty 10.2, 30d supply, fill #2
  Filled 2024-08-25: qty 10.2, 30d supply, fill #3
  Filled 2024-09-23: qty 10.2, 30d supply, fill #4
  Filled 2024-10-20: qty 10.2, 30d supply, fill #5

## 2024-05-07 MED ORDER — MONTELUKAST SODIUM 10 MG PO TABS
10.0000 mg | ORAL_TABLET | Freq: Every day | ORAL | 3 refills | Status: DC
  Filled 2024-05-07: qty 90, 90d supply, fill #0
  Filled 2024-05-30 – 2024-07-26 (×4): qty 90, 90d supply, fill #1

## 2024-05-07 MED ORDER — MOMETASONE FUROATE 50 MCG/ACT NA SUSP
2.0000 | Freq: Every day | NASAL | 0 refills | Status: DC
  Filled 2024-05-07: qty 51, 90d supply, fill #0

## 2024-05-07 MED ORDER — LEVOCETIRIZINE DIHYDROCHLORIDE 5 MG PO TABS
5.0000 mg | ORAL_TABLET | Freq: Every evening | ORAL | 3 refills | Status: DC
  Filled 2024-05-07 – 2024-07-26 (×5): qty 90, 90d supply, fill #0
  Filled 2024-10-30: qty 90, 90d supply, fill #1

## 2024-05-07 NOTE — Patient Instructions (Signed)
 Asthma Restart Symbicort  160-2 puffs twice a day with a spacer for asthma control Continue albuterol  2 puffs once every 4 hours if needed for cough or wheeze You may use albuterol  2 puffs 5 to 15 minutes before activity to decrease cough or wheeze For asthma flare, begin Pulmicort  0.5 mg via nebulizer twice a day for 2 weeks or until cough and wheeze free, then stop  Allergic rhinitis Continue allergen avoidance measures directed toward grass pollen, weed pollen, tree pollen, dust mite, dog, cat, cockroach, and horse as listed below Continue an antihistamine once a day if needed for runny nose or itch.Remember to rotate to a different antihistamine about every 3 months. Some examples of over the counter antihistamines include Zyrtec (cetirizine), Xyzal  (levocetirizine), Allegra (fexofenadine), and Claritin  (loratidine).  Continue Rhinocort  1 to 2 sprays in each nostril once a day if needed for stuffy nose.  In the right nostril, point the applicator out toward the right ear. In the left nostril, point the applicator out toward the left ear Consider saline nasal rinses as needed for nasal symptoms. Use this before any medicated nasal sprays for best result  Vertigo Follow-up with your ENT specialist as recommended Continue the physical therapy exercises as directed  Call the clinic if this treatment plan is not working well for you  Follow up in 2 months or sooner if needed.  Reducing Pollen Exposure The American Academy of Allergy, Asthma and Immunology suggests the following steps to reduce your exposure to pollen during allergy seasons. Do not hang sheets or clothing out to dry; pollen may collect on these items. Do not mow lawns or spend time around freshly cut grass; mowing stirs up pollen. Keep windows closed at night.  Keep car windows closed while driving. Minimize morning activities outdoors, a time when pollen counts are usually at their highest. Stay indoors as much as possible  when pollen counts or humidity is high and on windy days when pollen tends to remain in the air longer. Use air conditioning when possible.  Many air conditioners have filters that trap the pollen spores. Use a HEPA room air filter to remove pollen form the indoor air you breathe.  Control of Mold Allergen Mold and fungi can grow on a variety of surfaces provided certain temperature and moisture conditions exist.  Outdoor molds grow on plants, decaying vegetation and soil.  The major outdoor mold, Alternaria and Cladosporium, are found in very high numbers during hot and dry conditions.  Generally, a late Summer - Fall peak is seen for common outdoor fungal spores.  Rain will temporarily lower outdoor mold spore count, but counts rise rapidly when the rainy period ends.  The most important indoor molds are Aspergillus and Penicillium.  Dark, humid and poorly ventilated basements are ideal sites for mold growth.  The next most common sites of mold growth are the bathroom and the kitchen.  Outdoor [employer] Use air conditioning and keep windows closed Avoid exposure to decaying vegetation. Avoid leaf raking. Avoid grain handling. Consider wearing a face mask if working in moldy areas.  Indoor Mold Control Maintain humidity below 50%. Clean washable surfaces with 5% bleach solution. Remove sources e.g. Contaminated carpets.   Control of Dust Mite Allergen Dust mites play a major role in allergic asthma and rhinitis. They occur in environments with high humidity wherever human skin is found. Dust mites absorb humidity from the atmosphere (ie, they do not drink) and feed on organic matter (including shed human and animal  skin). Dust mites are a microscopic type of insect that you cannot see with the naked eye. High levels of dust mites have been detected from mattresses, pillows, carpets, upholstered furniture, bed covers, clothes, soft toys and any woven material. The principal allergen of the  dust mite is found in its feces. A gram of dust may contain 1,000 mites and 250,000 fecal particles. Mite antigen is easily measured in the air during house cleaning activities. Dust mites do not bite and do not cause harm to humans, other than by triggering allergies/asthma.  Ways to decrease your exposure to dust mites in your home:  1. Encase mattresses, box springs and pillows with a mite-impermeable barrier or cover  2. Wash sheets, blankets and drapes weekly in hot water (130 F) with detergent and dry them in a dryer on the hot setting.  3. Have the room cleaned frequently with a vacuum cleaner and a damp dust-mop. For carpeting or rugs, vacuuming with a vacuum cleaner equipped with a high-efficiency particulate air (HEPA) filter. The dust mite allergic individual should not be in a room which is being cleaned and should wait 1 hour after cleaning before going into the room.  4. Do not sleep on upholstered furniture (eg, couches).  5. If possible removing carpeting, upholstered furniture and drapery from the home is ideal. Horizontal blinds should be eliminated in the rooms where the person spends the most time (bedroom, study, television room). Washable vinyl, roller-type shades are optimal.  6. Remove all non-washable stuffed toys from the bedroom. Wash stuffed toys weekly like sheets and blankets above.  7. Reduce indoor humidity to less than 50%. Inexpensive humidity monitors can be purchased at most hardware stores. Do not use a humidifier as can make the problem worse and are not recommended.  Control of Dog or Cat Allergen Avoidance is the best way to manage a dog or cat allergy. If you have a dog or cat and are allergic to dog or cats, consider removing the dog or cat from the home. If you have a dog or cat but don't want to find it a new home, or if your family wants a pet even though someone in the household is allergic, here are some strategies that may help keep symptoms at  bay:  Keep the pet out of your bedroom and restrict it to only a few rooms. Be advised that keeping the dog or cat in only one room will not limit the allergens to that room. Don't pet, hug or kiss the dog or cat; if you do, wash your hands with soap and water. High-efficiency particulate air (HEPA) cleaners run continuously in a bedroom or living room can reduce allergen levels over time. Regular use of a high-efficiency vacuum cleaner or a central vacuum can reduce allergen levels. Giving your dog or cat a bath at least once a week can reduce airborne allergen.  Control of Cockroach Allergen Cockroach allergen has been identified as an important cause of acute attacks of asthma, especially in urban settings.  There are fifty-five species of cockroach that exist in the [place] , however only three, the [place], [place] and [place] species produce allergen that can affect patients with Asthma.  Allergens can be obtained from fecal particles, egg casings and secretions from cockroaches.    Remove food sources. Reduce access to water. Seal access and entry points. Spray runways with 0.5-1% Diazinon or Chlorpyrifos Blow boric acid power under stoves and refrigerator. Place bait stations (hydramethylnon) at feeding  sites.

## 2024-05-07 NOTE — Progress Notes (Signed)
 [address] [place] [address] Dept: [phone]  FOLLOW UP NOTE  Patient ID: Pam [name], female    DOB: 1951/02/15  Age: 73 y.o. MRN: [id number] Date of Office Visit: 05/07/2024  Assessment  Chief Complaint: Allergic Rhinitis  (Pt reports these past few months have been worse due the season.) and Asthma (Pt reports minimal chest tightness.  ACT 21)  HPI Pam [name] is a 73 year old female who presents to the clinic for a follow-up visit.  She was last seen in this clinic on 04/09/2023 by Dr. Marinda for evaluation asthma, allergic rhinitis, and vertigo.    At today's visit, she reports intermittent asthma symptoms that worsened yesterday and today including chest tightness, shortness of breath with activity, wheezing 1 day, and mild intermittent cough producing clear thin mucus.  She reports her asthma symptoms are aggravated by heated humidity.  She continues montelukast  10 mg once a day and has not used Symbicort , albuterol , or Pulmicort  over the last 6 months.   Allergic rhinitis is reported as moderately well-controlled with nasal congestion and rhinorrhea as the main symptoms.  She continues levocetirizine daily and uses Rhinocort  about 2 days a week.  She reports poor Rhinocort  application technique.  She is not currently using a nasal saline rinse, however, she reports that she has used saline nasal rinses in previous years.  Her last environmental allergy skin testing was on 04/30/2017 was positive to grass pollen, weed pollen, tree pollen, dust mite, dog, cat, cockroach, and horse.  She has a Morki at home who sleeps with her.  She reports the dog was traumatized at daycare and is too anxious to sleep alone at night.  She continues to experience intermittent episodes of vertigo for which she continues her physical therapy exercises which she learned many years ago with moderate relief of symptoms.  Her current medications are listed in the chart.  Drug Allergies:   Allergies  Allergen Reactions   Iodine    Ivp Dye [Iodinated Contrast Media] Other (See Comments)    Pt reports n/v; arm swelling; arm itching   Amoxicillin Diarrhea, Rash and Other (See Comments)   Gadobenate Nausea And Vomiting    Pt had some nausea and vomiting immediately after contrast administered.  Contrast dye   Latex Itching and Rash   Penicillins Other (See Comments) and Rash    Diarrhea, rash    Physical Exam: BP 110/80 (BP Location: Left Arm, Patient Position: Sitting, Cuff Size: Normal)   Pulse 86   Resp 18   Ht 5' 1.02 (1.55 m)   Wt 149 lb 1.6 oz (67.6 kg)   SpO2 96%   BMI 28.15 kg/m    Physical Exam Vitals reviewed.  Constitutional:      Appearance: Normal appearance.  HENT:     Head: Normocephalic and atraumatic.     Right Ear: Tympanic membrane normal.     Left Ear: Tympanic membrane normal.     Nose:     Comments: Bilateral nares edematous and pale with thin clear nasal drainage noted.  Pharynx normal.  Ears normal.  Eyes normal.    Mouth/Throat:     Pharynx: Oropharynx is clear.   Eyes:     Conjunctiva/sclera: Conjunctivae normal.    Cardiovascular:     Rate and Rhythm: Normal rate and regular rhythm.     Heart sounds: Normal heart sounds. No murmur heard. Pulmonary:     Effort: Pulmonary effort is normal.     Breath sounds: Normal breath  sounds.     Comments: Lungs clear to auscultation  Musculoskeletal:        General: Normal range of motion.     Cervical back: Normal range of motion and neck supple.   Skin:    General: Skin is warm and dry.   Neurological:     Mental Status: She is alert and oriented to person, place, and time.   Psychiatric:        Mood and Affect: Mood normal.        Behavior: Behavior normal.        Thought Content: Thought content normal.        Judgment: Judgment normal.     Diagnostics: FVC 1.62 which is 77% of predicted value, FEV1 1.1 469% of predicted value.  Spirometry indicates normal ventilatory  function.    Assessment and Plan: 1. Not well controlled moderate persistent asthma   2. Seasonal and perennial allergic rhinitis   3. Vertigo     Meds ordered this encounter  Medications   levocetirizine (XYZAL ) 5 MG tablet    Sig: Take 1 tablet (5 mg total) by mouth every evening.    Dispense:  90 tablet    Refill:  3   mometasone  (NASONEX ) 50 MCG/ACT nasal spray    Sig: Place 2 sprays into the nose daily.    Dispense:  51 each    Refill:  0   montelukast  (SINGULAIR ) 10 MG tablet    Sig: TAKE 1 TABLET BY MOUTH EVERYDAY AT BEDTIME    Dispense:  90 tablet    Refill:  3   budesonide -formoterol  (SYMBICORT ) 160-4.5 MCG/ACT inhaler    Sig: Inhale 2 puffs into the lungs 2 (two) times daily.    Dispense:  1 each    Refill:  5    Patient Instructions  Asthma Restart Symbicort  160-2 puffs twice a day with a spacer for asthma control Continue albuterol  2 puffs once every 4 hours if needed for cough or wheeze You may use albuterol  2 puffs 5 to 15 minutes before activity to decrease cough or wheeze For asthma flare, begin Pulmicort  0.5 mg via nebulizer twice a day for 2 weeks or until cough and wheeze free, then stop  Allergic rhinitis Continue allergen avoidance measures directed toward grass pollen, weed pollen, tree pollen, dust mite, dog, cat, cockroach, and horse as listed below Continue an antihistamine once a day if needed for runny nose or itch.Remember to rotate to a different antihistamine about every 3 months. Some examples of over the counter antihistamines include Zyrtec (cetirizine), Xyzal  (levocetirizine), Allegra (fexofenadine), and Claritin  (loratidine).  Continue Rhinocort  1 to 2 sprays in each nostril once a day if needed for stuffy nose.  In the right nostril, point the applicator out toward the right ear. In the left nostril, point the applicator out toward the left ear Consider saline nasal rinses as needed for nasal symptoms. Use this before any medicated nasal  sprays for best result  Vertigo Follow-up with your ENT specialist as recommended Continue the physical therapy exercises as directed  Call the clinic if this treatment plan is not working well for you  Follow up in 2 months or sooner if needed.   Return in about 2 months (around 07/07/2024), or if symptoms worsen or fail to improve.    Thank you for the opportunity to care for this patient.  Please do not hesitate to contact me with questions.  Arlean Mutter, FNP [facility]  [place]

## 2024-05-12 DIAGNOSIS — M9903 Segmental and somatic dysfunction of lumbar region: Secondary | ICD-10-CM | POA: Diagnosis not present

## 2024-05-12 DIAGNOSIS — M9902 Segmental and somatic dysfunction of thoracic region: Secondary | ICD-10-CM | POA: Diagnosis not present

## 2024-05-12 DIAGNOSIS — S39012A Strain of muscle, fascia and tendon of lower back, initial encounter: Secondary | ICD-10-CM | POA: Diagnosis not present

## 2024-05-12 DIAGNOSIS — S161XXA Strain of muscle, fascia and tendon at neck level, initial encounter: Secondary | ICD-10-CM | POA: Diagnosis not present

## 2024-05-12 DIAGNOSIS — M9901 Segmental and somatic dysfunction of cervical region: Secondary | ICD-10-CM | POA: Diagnosis not present

## 2024-05-12 DIAGNOSIS — S29012A Strain of muscle and tendon of back wall of thorax, initial encounter: Secondary | ICD-10-CM | POA: Diagnosis not present

## 2024-05-19 ENCOUNTER — Other Ambulatory Visit (HOSPITAL_COMMUNITY): Payer: Self-pay

## 2024-05-25 ENCOUNTER — Other Ambulatory Visit: Payer: Self-pay

## 2024-05-25 ENCOUNTER — Other Ambulatory Visit (HOSPITAL_COMMUNITY): Payer: Self-pay

## 2024-05-30 MED FILL — Ketoconazole Cream 2%: CUTANEOUS | 30 days supply | Qty: 60 | Fill #0 | Status: AC

## 2024-05-31 ENCOUNTER — Other Ambulatory Visit: Payer: Self-pay

## 2024-05-31 ENCOUNTER — Ambulatory Visit: Admitting: Physical Medicine and Rehabilitation

## 2024-05-31 ENCOUNTER — Other Ambulatory Visit (HOSPITAL_COMMUNITY): Payer: Self-pay

## 2024-05-31 DIAGNOSIS — S29012A Strain of muscle and tendon of back wall of thorax, initial encounter: Secondary | ICD-10-CM | POA: Diagnosis not present

## 2024-05-31 DIAGNOSIS — S161XXA Strain of muscle, fascia and tendon at neck level, initial encounter: Secondary | ICD-10-CM | POA: Diagnosis not present

## 2024-05-31 DIAGNOSIS — M9903 Segmental and somatic dysfunction of lumbar region: Secondary | ICD-10-CM | POA: Diagnosis not present

## 2024-05-31 DIAGNOSIS — S39012A Strain of muscle, fascia and tendon of lower back, initial encounter: Secondary | ICD-10-CM | POA: Diagnosis not present

## 2024-05-31 DIAGNOSIS — M9902 Segmental and somatic dysfunction of thoracic region: Secondary | ICD-10-CM | POA: Diagnosis not present

## 2024-05-31 DIAGNOSIS — M9901 Segmental and somatic dysfunction of cervical region: Secondary | ICD-10-CM | POA: Diagnosis not present

## 2024-06-02 DIAGNOSIS — M9902 Segmental and somatic dysfunction of thoracic region: Secondary | ICD-10-CM | POA: Diagnosis not present

## 2024-06-02 DIAGNOSIS — S161XXA Strain of muscle, fascia and tendon at neck level, initial encounter: Secondary | ICD-10-CM | POA: Diagnosis not present

## 2024-06-02 DIAGNOSIS — S29012A Strain of muscle and tendon of back wall of thorax, initial encounter: Secondary | ICD-10-CM | POA: Diagnosis not present

## 2024-06-02 DIAGNOSIS — M9903 Segmental and somatic dysfunction of lumbar region: Secondary | ICD-10-CM | POA: Diagnosis not present

## 2024-06-02 DIAGNOSIS — S39012A Strain of muscle, fascia and tendon of lower back, initial encounter: Secondary | ICD-10-CM | POA: Diagnosis not present

## 2024-06-02 DIAGNOSIS — M9901 Segmental and somatic dysfunction of cervical region: Secondary | ICD-10-CM | POA: Diagnosis not present

## 2024-06-07 DIAGNOSIS — M9903 Segmental and somatic dysfunction of lumbar region: Secondary | ICD-10-CM | POA: Diagnosis not present

## 2024-06-07 DIAGNOSIS — M9901 Segmental and somatic dysfunction of cervical region: Secondary | ICD-10-CM | POA: Diagnosis not present

## 2024-06-07 DIAGNOSIS — M9902 Segmental and somatic dysfunction of thoracic region: Secondary | ICD-10-CM | POA: Diagnosis not present

## 2024-06-07 DIAGNOSIS — S39012A Strain of muscle, fascia and tendon of lower back, initial encounter: Secondary | ICD-10-CM | POA: Diagnosis not present

## 2024-06-07 DIAGNOSIS — S29012A Strain of muscle and tendon of back wall of thorax, initial encounter: Secondary | ICD-10-CM | POA: Diagnosis not present

## 2024-06-07 DIAGNOSIS — S161XXA Strain of muscle, fascia and tendon at neck level, initial encounter: Secondary | ICD-10-CM | POA: Diagnosis not present

## 2024-06-09 DIAGNOSIS — S29012A Strain of muscle and tendon of back wall of thorax, initial encounter: Secondary | ICD-10-CM | POA: Diagnosis not present

## 2024-06-09 DIAGNOSIS — M9902 Segmental and somatic dysfunction of thoracic region: Secondary | ICD-10-CM | POA: Diagnosis not present

## 2024-06-09 DIAGNOSIS — M9903 Segmental and somatic dysfunction of lumbar region: Secondary | ICD-10-CM | POA: Diagnosis not present

## 2024-06-09 DIAGNOSIS — M9901 Segmental and somatic dysfunction of cervical region: Secondary | ICD-10-CM | POA: Diagnosis not present

## 2024-06-09 DIAGNOSIS — S39012A Strain of muscle, fascia and tendon of lower back, initial encounter: Secondary | ICD-10-CM | POA: Diagnosis not present

## 2024-06-09 DIAGNOSIS — S161XXA Strain of muscle, fascia and tendon at neck level, initial encounter: Secondary | ICD-10-CM | POA: Diagnosis not present

## 2024-06-14 DIAGNOSIS — M9902 Segmental and somatic dysfunction of thoracic region: Secondary | ICD-10-CM | POA: Diagnosis not present

## 2024-06-14 DIAGNOSIS — S161XXA Strain of muscle, fascia and tendon at neck level, initial encounter: Secondary | ICD-10-CM | POA: Diagnosis not present

## 2024-06-14 DIAGNOSIS — M9903 Segmental and somatic dysfunction of lumbar region: Secondary | ICD-10-CM | POA: Diagnosis not present

## 2024-06-14 DIAGNOSIS — S29012A Strain of muscle and tendon of back wall of thorax, initial encounter: Secondary | ICD-10-CM | POA: Diagnosis not present

## 2024-06-14 DIAGNOSIS — M9901 Segmental and somatic dysfunction of cervical region: Secondary | ICD-10-CM | POA: Diagnosis not present

## 2024-06-14 DIAGNOSIS — S39012A Strain of muscle, fascia and tendon of lower back, initial encounter: Secondary | ICD-10-CM | POA: Diagnosis not present

## 2024-06-15 ENCOUNTER — Ambulatory Visit: Admitting: Nurse Practitioner

## 2024-06-15 VITALS — BP 128/80 | HR 67 | Wt 151.6 lb

## 2024-06-15 DIAGNOSIS — M47816 Spondylosis without myelopathy or radiculopathy, lumbar region: Secondary | ICD-10-CM

## 2024-06-15 DIAGNOSIS — E1149 Type 2 diabetes mellitus with other diabetic neurological complication: Secondary | ICD-10-CM

## 2024-06-15 DIAGNOSIS — E1169 Type 2 diabetes mellitus with other specified complication: Secondary | ICD-10-CM

## 2024-06-15 DIAGNOSIS — J4540 Moderate persistent asthma, uncomplicated: Secondary | ICD-10-CM

## 2024-06-15 DIAGNOSIS — R2689 Other abnormalities of gait and mobility: Secondary | ICD-10-CM

## 2024-06-15 DIAGNOSIS — F330 Major depressive disorder, recurrent, mild: Secondary | ICD-10-CM | POA: Diagnosis not present

## 2024-06-15 DIAGNOSIS — E785 Hyperlipidemia, unspecified: Secondary | ICD-10-CM | POA: Diagnosis not present

## 2024-06-15 DIAGNOSIS — Z604 Social exclusion and rejection: Secondary | ICD-10-CM

## 2024-06-15 DIAGNOSIS — M797 Fibromyalgia: Secondary | ICD-10-CM

## 2024-06-15 NOTE — Progress Notes (Signed)
 Catheline Doing, DNP, AGNP-c [facility]  [address] [place], [place] [address] [phone]  ESTABLISHED PATIENT- Chronic Health and/or Follow-Up Visit  Blood pressure 128/80, pulse 67, weight 151 lb 9.6 oz (68.8 kg).    History of Present Illness Pam Peregoy Ms. Morandi or [name] is a 73 year old female with fibromyalgia and diabetes who presents for a routine follow-up.  She is undergoing physical therapy and acupuncture for fibromyalgia. Initially, she experienced significant pain with an electronic acupuncture technique that was only available once a month. She has since been seeing Dr. Luke, a chiropractor and acupuncturist, twice a week for almost a month and notes improvement, although she experiences increased pain with weather changes.  She has not been wearing her glucose sensor or taking Ozempic  for a few weeks but resumed Ozempic  today. During this period, she experienced weight gain and increased hunger, which she attributes to not taking her medication. She reports that when she was taking 0.5 mg of Ozempic , her blood sugar numbers were great. She has been experiencing nighttime hypoglycemia and manages it by eating a piece of fruit before bed.  She has a history of degenerative spine issues and avoids certain gym equipment that exacerbates her pain. She plans to return to the gym and has found a therapist in the same building, which she hopes will help with her mental health and motivation.  She experienced a recent family conflict that affected her emotionally, and she plans to discuss this with her new therapist. She also mentions concerns about her home, specifically sewage issues that could be costly to repair, contributing to her stress.  No falls, but she notes some balance issues, which she attributes to not doing her exercises.  All ROS negative with exception of what is listed above.   PHYSICAL EXAM Physical Exam Vitals and nursing note  reviewed.  Constitutional:      Appearance: Normal appearance.  HENT:     Head: Normocephalic.  Eyes:     Pupils: Pupils are equal, round, and reactive to light.  Cardiovascular:     Rate and Rhythm: Normal rate and regular rhythm.     Pulses: Normal pulses.     Heart sounds: Normal heart sounds.  Pulmonary:     Effort: Pulmonary effort is normal.     Breath sounds: Normal breath sounds.  Abdominal:     General: Bowel sounds are normal.     Palpations: Abdomen is soft.  Musculoskeletal:        General: Normal range of motion.     Cervical back: Normal range of motion.     Right lower leg: No edema.     Left lower leg: No edema.  Skin:    General: Skin is warm.     Capillary Refill: Capillary refill takes less than 2 seconds.  Neurological:     General: No focal deficit present.     Mental Status: She is alert and oriented to person, place, and time.     Sensory: Sensory deficit present.     Motor: Weakness present.     Gait: Gait abnormal.  Psychiatric:        Mood and Affect: Mood normal.      PLAN Problem List Items Addressed This Visit     Fibromyalgia   Chronic fibromyalgia with recent improvement due to acupuncture therapy, despite weather-related exacerbations. - Continue acupuncture therapy twice weekly - Monitor symptoms and adjust therapy as needed  Type 2 diabetes mellitus with neurological complications (HCC) - Primary   Type 2 diabetes mellitus with recent non-compliance with Ozempic  and glucose monitoring, leading to increased hunger and weight gain. - Administer Ozempic  1 mg weekly - Schedule fasting blood work prior to cardiology appointment - Discuss potential for lifelong management with diabetes medications      Hyperlipidemia associated with type 2 diabetes mellitus (HCC)   Hyperlipidemia with recent skipped doses with Repatha . - Administer Repatha  as prescribed - Schedule lipid panel in 4 weeks      Moderate persistent asthma,  uncomplicated   Asthma exacerbated by recent humidity and heat. - Follow up with allergist as scheduled      Spondylosis without myelopathy or radiculopathy, lumbar region   Chronic degenerative disc disease with recent exacerbation due to inappropriate exercise. - Avoid exercises that exacerbate symptoms - Consult with trainer to adjust exercise plan      Major depressive disorder, recurrent episode, mild with anxious distress (HCC)   Major depressive disorder with increased social isolation and stress due to personal and environmental factors. - Attend therapy sessions weekly - Encourage social engagement and activities       Balance disorder   Balance impairment due to discontinuation of balance exercises. - Resume balance exercises regularly       Social isolation   Increased social isolation contributing to depressive symptoms. - Participate in planned travel and social activities - Engage in community events and groups       Return for she is scheduled in 1 month- will keep .  Pam Mckaylin Bastien, DNP, AGNP-c Time: 50 minutes, >50% spent counseling, care coordination, chart review, and documentation.

## 2024-06-15 NOTE — Patient Instructions (Addendum)
 Let's plan to check your labs in about a month- this will give your medication time to get back in your system. We have an appointment scheduled in September already, we can just keep that.   Call and get an appointment with [name] in about 6 weeks. This will allow us  to get the labs in a month so she can use them, too.   I am so glad you are having good luck with acupuncture and Dr. Atha.  Do not fret over the things that might be. You don't need to add possible stressors to the stressors that are already here.   Eat a small protein snack before bed every night.   Work on Designer, industrial/product and being out around people. I am so happy you have found a counselor!! This is great!

## 2024-06-17 ENCOUNTER — Ambulatory Visit (INDEPENDENT_AMBULATORY_CARE_PROVIDER_SITE_OTHER): Admitting: Licensed Clinical Social Worker

## 2024-06-17 DIAGNOSIS — F4323 Adjustment disorder with mixed anxiety and depressed mood: Secondary | ICD-10-CM | POA: Diagnosis not present
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
## 2024-06-22 ENCOUNTER — Emergency Department (HOSPITAL_COMMUNITY)
Admission: EM | Admit: 2024-06-22 | Discharge: 2024-06-22 | Disposition: A | Discharge status: Home / Self Care | Attending: Emergency Medicine | Admitting: Emergency Medicine

## 2024-06-22 ENCOUNTER — Encounter (HOSPITAL_COMMUNITY): Payer: Self-pay

## 2024-06-22 ENCOUNTER — Telehealth

## 2024-06-22 ENCOUNTER — Encounter: Payer: Self-pay | Admitting: Nurse Practitioner

## 2024-06-22 ENCOUNTER — Emergency Department (HOSPITAL_COMMUNITY)

## 2024-06-22 DIAGNOSIS — Z604 Social exclusion and rejection: Secondary | ICD-10-CM | POA: Insufficient documentation

## 2024-06-22 DIAGNOSIS — I10 Essential (primary) hypertension: Secondary | ICD-10-CM | POA: Diagnosis not present

## 2024-06-22 DIAGNOSIS — Z7984 Long term (current) use of oral hypoglycemic drugs: Secondary | ICD-10-CM | POA: Diagnosis not present

## 2024-06-22 DIAGNOSIS — R0602 Shortness of breath: Secondary | ICD-10-CM | POA: Diagnosis not present

## 2024-06-22 DIAGNOSIS — R059 Cough, unspecified: Secondary | ICD-10-CM | POA: Diagnosis present

## 2024-06-22 DIAGNOSIS — J45909 Unspecified asthma, uncomplicated: Secondary | ICD-10-CM | POA: Diagnosis not present

## 2024-06-22 DIAGNOSIS — Z7951 Long term (current) use of inhaled steroids: Secondary | ICD-10-CM | POA: Diagnosis not present

## 2024-06-22 DIAGNOSIS — Z7982 Long term (current) use of aspirin: Secondary | ICD-10-CM | POA: Diagnosis not present

## 2024-06-22 DIAGNOSIS — Z91040 Latex allergy status: Secondary | ICD-10-CM | POA: Diagnosis not present

## 2024-06-22 DIAGNOSIS — E119 Type 2 diabetes mellitus without complications: Secondary | ICD-10-CM | POA: Diagnosis not present

## 2024-06-22 DIAGNOSIS — R2689 Other abnormalities of gait and mobility: Secondary | ICD-10-CM | POA: Insufficient documentation

## 2024-06-22 DIAGNOSIS — R079 Chest pain, unspecified: Secondary | ICD-10-CM | POA: Diagnosis not present

## 2024-06-22 DIAGNOSIS — U071 COVID-19: Secondary | ICD-10-CM | POA: Insufficient documentation

## 2024-06-22 LAB — CBC
HCT: 37 % (ref 36.0–46.0)
Hemoglobin: 12.3 g/dL (ref 12.0–15.0)
MCH: 26.5 pg (ref 26.0–34.0)
MCHC: 33.2 g/dL (ref 30.0–36.0)
MCV: 79.6 fL — ABNORMAL LOW (ref 80.0–100.0)
Platelets: 223 K/uL (ref 150–400)
RBC: 4.65 MIL/uL (ref 3.87–5.11)
RDW: 13.5 % (ref 11.5–15.5)
WBC: 3.1 K/uL — ABNORMAL LOW (ref 4.0–10.5)
nRBC: 0 % (ref 0.0–0.2)

## 2024-06-22 LAB — RESP PANEL BY RT-PCR (RSV, FLU A&B, COVID)  RVPGX2
Influenza A by PCR: NEGATIVE
Influenza B by PCR: NEGATIVE
Resp Syncytial Virus by PCR: NEGATIVE
SARS Coronavirus 2 by RT PCR: POSITIVE — AB

## 2024-06-22 LAB — CBG MONITORING, ED
Glucose-Capillary: 118 mg/dL — ABNORMAL HIGH (ref 70–99)
Glucose-Capillary: 103 mg/dL — ABNORMAL HIGH (ref 70–99)
Glucose-Capillary: 103 mg/dL — ABNORMAL HIGH (ref 70–99)

## 2024-06-22 LAB — BASIC METABOLIC PANEL WITH GFR
Anion gap: 9 (ref 5–15)
BUN: 8 mg/dL (ref 8–23)
CO2: 25 mmol/L (ref 22–32)
Calcium: 9 mg/dL (ref 8.9–10.3)
Chloride: 104 mmol/L (ref 98–111)
Creatinine, Ser: 0.63 mg/dL (ref 0.44–1.00)
GFR, Estimated: >60 mL/min (ref >60)
Glucose, Bld: 107 mg/dL — ABNORMAL HIGH (ref 70–99)
Potassium: 3.9 mmol/L (ref 3.5–5.1)
Sodium: 138 mmol/L (ref 135–145)

## 2024-06-22 MED ORDER — ACETAMINOPHEN 500 MG PO TABS
1000.0000 mg | ORAL_TABLET | Freq: Once | ORAL | Status: AC
  Administered 2024-06-22 (×2): 1000 mg via ORAL
  Filled 2024-06-22: qty 2

## 2024-06-22 MED ORDER — IPRATROPIUM-ALBUTEROL 0.5-2.5 (3) MG/3ML IN SOLN
3.0000 mL | Freq: Once | RESPIRATORY_TRACT | Status: AC
  Administered 2024-06-22 (×2): 3 mL via RESPIRATORY_TRACT
  Filled 2024-06-22: qty 3

## 2024-06-22 NOTE — Assessment & Plan Note (Signed)
 Balance impairment due to discontinuation of balance exercises. - Resume balance exercises regularly

## 2024-06-22 NOTE — Assessment & Plan Note (Signed)
 Hyperlipidemia with recent skipped doses with Repatha . - Administer Repatha  as prescribed - Schedule lipid panel in 4 weeks

## 2024-06-22 NOTE — Assessment & Plan Note (Signed)
 Major depressive disorder with increased social isolation and stress due to personal and environmental factors. - Attend therapy sessions weekly - Encourage social engagement and activities

## 2024-06-22 NOTE — ED Provider Notes (Signed)
 I provided a substantive portion of the care of this patient.  I personally made/approved the management plan for this patient and take responsibility for the patient management.      74 year old female presents due to decreasing blood sugars.  Recent downs with COVID.  Blood sugars stable here.  Will discharge home   Pam Faden, MD 06/22/24 1304

## 2024-06-22 NOTE — Assessment & Plan Note (Signed)
 Type 2 diabetes mellitus with recent non-compliance with Ozempic  and glucose monitoring, leading to increased hunger and weight gain. - Administer Ozempic  1 mg weekly - Schedule fasting blood work prior to cardiology appointment - Discuss potential for lifelong management with diabetes medications

## 2024-06-22 NOTE — Assessment & Plan Note (Signed)
 Chronic degenerative disc disease with recent exacerbation due to inappropriate exercise. - Avoid exercises that exacerbate symptoms - Consult with trainer to adjust exercise plan

## 2024-06-22 NOTE — ED Provider Notes (Signed)
 [place] EMERGENCY DEPARTMENT AT [facility] Provider Note   CSN: [id number] Arrival date & time: 06/22/24  9158     Patient presents with: Shortness of Breath, Headache, Cough, and Nasal Congestion   Pam [name] or Pam [name] is a 73 y.o. female.  Patient with past medical history of type 2 diabetes, GERD, hypertension, hyperlipidemia, asthma presenting to emergency room with complaint of 2 days of congestion, cough, sinus pressure.  Patient had a positive COVID test at home yesterday.  Patient reports what brought her in today was low blood sugar readings, denies specific symptoms associated with this, has been tolerating eating and drinking. Monitor reading upper 60s here, but POC 100s.    Shortness of Breath Associated symptoms: cough and headaches   Headache Associated symptoms: cough   Cough Associated symptoms: headaches and shortness of breath        Prior to Admission medications   Medication Sig Start Date End Date Taking? Authorizing Provider  acetaminophen  (TYLENOL ) 500 MG tablet Take 500 mg by mouth as needed for mild pain or moderate pain.    [provider]  albuterol  (VENTOLIN  HFA) 108 (90 Base) MCG/ACT inhaler Inhale 1 puff into the lungs every 4 (four) hours as needed. 01/20/24   Marinda Rocky SAILOR, MD  aspirin  EC 81 MG tablet Take 1 tablet (81 mg total) by mouth daily. Swallow whole. 08/07/22   Cheryle Page, MD  budesonide  (PULMICORT ) 0.5 MG/2ML nebulizer solution Take 2 mLs (0.5 mg total) by nebulization 2 (two) times daily. During respiratory flares for at least 2 weeks or until symptoms resolve. 09/24/22   Marinda Rocky SAILOR, MD  budesonide -formoterol  (SYMBICORT ) 160-4.5 MCG/ACT inhaler Inhale 2 puffs into the lungs 2 (two) times daily. 05/07/24   Cari Arlean HERO, FNP  Calcium  250 MG CAPS Take by mouth.    [provider]  Cholecalciferol (VITAMIN D3) 50 MCG (2000 UT) CAPS Take by mouth.    [provider]  co-enzyme Q-10 30 MG  capsule Take 30 mg by mouth 3 (three) times daily.    [provider]  Continuous Glucose Sensor (FREESTYLE LIBRE 3 PLUS SENSOR) MISC Change sensor every 15 days. 01/20/24   Early, Sara E, NP  Evolocumab  (REPATHA  SURECLICK) 140 MG/ML SOAJ Inject 140 mg into the skin every 14 (fourteen) days. Patient not taking: Reported on 06/15/2024 04/14/23   Vannie Reche RAMAN, NP  ketoconazole  (NIZORAL ) 2 % cream APPLY 1 APPLICATION TOPICALLY 2 (TWO) TIMES DAILY. TO AFFECTED AREAS. 07/03/23   Early, Sara E, NP  levocetirizine (XYZAL ) 5 MG tablet Take 1 tablet (5 mg total) by mouth every evening. 05/07/24   Ambs, Arlean HERO, FNP  Magnesium  100 MG CAPS Take by mouth.    [provider]  mometasone  (NASONEX ) 50 MCG/ACT nasal spray Place 2 sprays into the nose daily. 05/07/24   Cari Arlean HERO, FNP  montelukast  (SINGULAIR ) 10 MG tablet Take 1 tablet (10 mg total) by mouth at bedtime. 05/07/24   Cari Arlean HERO, FNP  Multiple Vitamin (MULTIVITAMIN WITH MINERALS) TABS tablet Take 1 tablet by mouth daily.    [provider]  NALTREXONE HCL, PAIN, PO Take 1 mg by mouth at bedtime.    [provider]  nitroGLYCERIN  (NITROSTAT ) 0.4 MG SL tablet Place 1 tablet (0.4 mg total) under the tongue every 5 (five) minutes as needed for chest pain. 08/06/22   Cheryle Page, MD  Omega-3 Fatty Acids (FISH OIL) 300 MG CAPS Take by mouth.  [provider]  Polyethyl Glycol-Propyl Glycol (SYSTANE FREE OP) Apply 2 drops to eye daily as needed (both eyes as needed).    [provider]  rosuvastatin  (CRESTOR ) 5 MG tablet Take 1 tablet (5 mg total) by mouth 3 (three) times a week. Every Monday, Wednesday, & Friday 09/03/23   Vannie Reche RAMAN, NP  Semaglutide , 1 MG/DOSE, 4 MG/3ML SOPN Inject 1 mg as directed once a week. Patient not taking: Reported on 06/15/2024 10/01/23   Early, [name] BRAVO, NP  Vitamin D , Ergocalciferol , (DRISDOL ) 1.25 MG (50000 UNIT) CAPS capsule Take 1 capsule (50,000 Units total) by mouth  every 7 (seven) days. 01/20/24   Early, Sara E, NP    Allergies: Iodine, Ivp dye [iodinated contrast media], Amoxicillin, Gadobenate, Latex, and Penicillins    Review of Systems  Respiratory:  Positive for cough and shortness of breath.   Neurological:  Positive for headaches.    Updated Vital Signs BP 120/77 (BP Location: Right Arm)   Pulse 80   Temp 98.3 F (36.8 C)   Resp 17   SpO2 98%   Physical Exam  (all labs ordered are listed, but only abnormal results are displayed) Labs Reviewed  BASIC METABOLIC PANEL WITH GFR - Abnormal; Notable for the following components:      Result Value   Glucose, Bld 107 (*)    All other components within normal limits  CBC - Abnormal; Notable for the following components:   WBC 3.1 (*)    MCV 79.6 (*)    All other components within normal limits  CBG MONITORING, ED - Abnormal; Notable for the following components:   Glucose-Capillary 118 (*)    All other components within normal limits  CBG MONITORING, ED - Abnormal; Notable for the following components:   Glucose-Capillary 103 (*)    All other components within normal limits  CBG MONITORING, ED - Abnormal; Notable for the following components:   Glucose-Capillary 103 (*)    All other components within normal limits  RESP PANEL BY RT-PCR (RSV, FLU A&B, COVID)  [id number]    EKG: None  Radiology: DG Chest 2 View Result Date: 06/22/2024 CLINICAL DATA:  Shortness of breath and chest pain for 2 days. EXAM: CHEST - 2 VIEW COMPARISON:  08/25/2023 FINDINGS: The cardiomediastinal contours are normal. The lungs are clear. Pulmonary vasculature is normal. No consolidation, pleural effusion, or pneumothorax. Mild thoracic spondylosis. No acute osseous abnormalities are seen. IMPRESSION: No active cardiopulmonary disease. Electronically Signed   By: Andrea Gasman M.D.   On: 06/22/2024 10:33     Procedures   Medications Ordered in the ED - No data to display                                   Medical Decision Making Amount and/or Complexity of Data Reviewed Labs: ordered. Radiology: ordered.  Risk OTC drugs. Prescription drug management.   Pam [name] 73 y.o. presented today for URI like symptoms. Working DDx that I considered at this time includes, but not limited to, viral illness, pharyngitis, mono, sinusitis, electrolyte abnormality, AOM.  R/o DDx: these additional diagnoses are not consistent with patient's history, presentation, physical exam, labs/imaging findings.  Labs:  Patient's white blood cell count 3.1 no significant anemia. CBG 103, on recheck several hours later remains at 103  Imaging:  Chest x-ray with no acute findings  Problem List / ED Course / Critical interventions / Medication  management  Patient presenting with primary complaint of hypoglycemia.  When checking blood sugar here they have been stable in the 100s while her monitor is reading 70.  Question if she is having faulty monitor/ readings at home.  She does have a finger stick glucose system at home that she can check with and replacement monitor and since blood sugars have been stable here feel she is appropriate to continue monitoring this at home.  Patient also reports that she has had bodyaches, headache congestion and cough recently tested positive for COVID yesterday she does note some mild associated shortness of breath.  Symptoms do seem consistent with viral illness.  Her shortness of breath did improve after DuoNeb and she does have inhalers including rescue inhaler at home. No chest pain with this.  Vitals have been stable throughout stay.  Patient is feeling better and would like to go home.  Discussed with Dr. Dasie attending who agrees to plan of patient being discharged with outpatient follow-up. I ordered medication including Tylenol  for body aches, DuoNeb. Reevaluation of the patient after these medicines showed that the patient improved Patients vitals assessed. Upon arrival  patient is hemodynamically stable.  I have reviewed the patients home medicines and have made adjustments as needed Patient stable throughout stay.  Not hypoxic.  Not febrile.  Feeling well after treatment here.  Symptoms do seem most consistent with viral illness feel stable for discharge with outpatient follow-up.  She was given strict return precautions.     Final diagnoses:  COVID    ED Discharge Orders     None          Pam [name] 06/22/24 1327    Pam Faden, MD 06/29/24 1047

## 2024-06-22 NOTE — Assessment & Plan Note (Signed)
 Increased social isolation contributing to depressive symptoms. - Participate in planned travel and social activities - Engage in community events and groups

## 2024-06-22 NOTE — Assessment & Plan Note (Signed)
 Asthma exacerbated by recent humidity and heat. - Follow up with allergist as scheduled

## 2024-06-22 NOTE — Discharge Instructions (Signed)
 You were seen in the emergency room today.  Chest x-ray is normal.  Since you have COVID make sure you are staying well-hydrated.  Carry albuterol  inhaler with you.  Return to emergency room with new or worsening symptoms.  Schedule follow-up with your primary care doctor in 2 to 3 days to make sure symptoms are improving as expected.

## 2024-06-22 NOTE — ED Notes (Signed)
 Pt removed cgm device.

## 2024-06-22 NOTE — ED Notes (Signed)
 Patient's CBG showed 103, but her CGM read 62 alarming low. We recommend patient remove CGM due to inaccuracy

## 2024-06-22 NOTE — ED Triage Notes (Addendum)
 Pt. Stated, I was dx with COVID yesterday (I took my on COVID test) cause Ive had nasal congestion, cough, and I got SOB .I feel like my sinuses is on fire. Im also wearing a glucoses monitor for my Type II diabetes and it continues to go off. I did my own COVID test.

## 2024-06-22 NOTE — Assessment & Plan Note (Signed)
 Chronic fibromyalgia with recent improvement due to acupuncture therapy, despite weather-related exacerbations. - Continue acupuncture therapy twice weekly - Monitor symptoms and adjust therapy as needed

## 2024-06-22 NOTE — ED Notes (Signed)
 Patient stated that main reason she came is that her CGM has been reading low, specifically when she has been going to bed. She stated that she has been hesitant to sleep due to fear of dropping in sleep. CBG obtained that read 103, while her CGM was alarming low at 74. I advised patient that it could be a bad site with CGM.

## 2024-06-24 ENCOUNTER — Other Ambulatory Visit (HOSPITAL_COMMUNITY): Payer: Self-pay

## 2024-06-25 ENCOUNTER — Other Ambulatory Visit (HOSPITAL_COMMUNITY): Payer: Self-pay

## 2024-06-28 ENCOUNTER — Other Ambulatory Visit (HOSPITAL_COMMUNITY): Payer: Self-pay

## 2024-06-28 ENCOUNTER — Other Ambulatory Visit: Payer: Self-pay | Admitting: Family Medicine

## 2024-06-28 ENCOUNTER — Other Ambulatory Visit: Payer: Self-pay

## 2024-06-28 ENCOUNTER — Encounter (HOSPITAL_COMMUNITY): Payer: Self-pay

## 2024-06-28 ENCOUNTER — Ambulatory Visit: Payer: Self-pay

## 2024-06-28 ENCOUNTER — Telehealth: Payer: Self-pay | Admitting: Physical Medicine and Rehabilitation

## 2024-06-28 MED ORDER — MOMETASONE FUROATE 50 MCG/ACT NA SUSP
2.0000 | Freq: Every day | NASAL | 0 refills | Status: DC
  Filled 2024-06-28 – 2024-07-21 (×3): qty 51, 90d supply, fill #0

## 2024-06-28 MED FILL — Ketoconazole Cream 2%: CUTANEOUS | 30 days supply | Qty: 60 | Fill #1 | Status: AC

## 2024-06-28 NOTE — Telephone Encounter (Signed)
 FYI Only or Action Required?: Action required by provider: sent to Urgent Care.  Patient was last seen in primary care on 06/15/2024 by Early, [name] BRAVO, NP.  Called Nurse Triage reporting Breathing Problem.  Symptoms began several days ago.  Interventions attempted: Other: prescribed asthma medications and humidifier with Vicks, tea lemon and honey.  Symptoms are: gradually worsening.  Triage Disposition: See HCP Within 4 Hours (Or PCP Triage)  Patient/caregiver understands and will follow disposition?: Yes      Copied from CRM [id number]. Topic: Clinical - Medical Advice >> Jun 28, 2024  2:23 PM Graeme ORN wrote: Reason for CRM: Patient called. Tested positive for Covid a week ago. Symptoms up and down. Thinks she is feeling better then gets sick again. Congestion and Cough and Sore throat.  Has taken tea and Honey and using vaporizer. Would like to know if ita ok if she takes coricidin cold and flu. If not what else would provider recommend. Knows she is past paxlovid window. Thank You Reason for Disposition  [1] MILD difficulty breathing (e.g., minimal/no SOB at rest, SOB with walking, pulse < 100) AND [2] still present when not coughing  Answer Assessment - Initial Assessment Questions Advised patient to go to Urgent Care Patient reports Covid positive Concerned about breathing, had wheezing, but today shortness of breath-notices with activity walking upstairs Sweating at night while sleeping Uses: Humidifer;vicks, nothing different, tea lemon and honey, cough drops  1. ONSET: When did the cough begin?      1 week ago 2. SEVERITY: How bad is the cough today?      Coughing more today 3. SPUTUM: Describe the color of your sputum (e.g., none, dry cough; clear, white, yellow, green)     Productive; last couple of days, mixed with yellow and foamy white 4. HEMOPTYSIS: Are you coughing up any blood? If Yes, ask: How much? (e.g., flecks, streaks, tablespoons, etc.)     no 5.  DIFFICULTY BREATHING: Are you having difficulty breathing? If Yes, ask: How bad is it? (e.g., mild, moderate, severe)      Moderate;no oxygen use 6. FEVER: Do you have a fever? If Yes, ask: What is your temperature, how was it measured, and when did it start?     no 7. CARDIAC HISTORY: Do you have any history of heart disease? (e.g., heart attack, congestive heart failure)      no 8. LUNG HISTORY: Do you have any history of lung disease?  (e.g., pulmonary embolus, asthma, emphysema)     Asthma 9. PE RISK FACTORS: Do you have a history of blood clots? (or: recent major surgery, recent prolonged travel, bedridden)     Aspirin  81 mg;  10. OTHER SYMPTOMS: Do you have any other symptoms? (e.g., runny nose, wheezing, chest pain)       Not currently wheezing, breathing is concerning, chest pain-tightness,area around heart, more centered.denies n/v, denies pain radiating, 1/10, after coughing Hx of costochondritis  12. TRAVEL: Have you traveled out of the country in the last month? (e.g., travel history, exposures)       no  Protocols used: Cough - Acute Productive-A-AH

## 2024-06-28 NOTE — Telephone Encounter (Signed)
 P called and stated she tested positive for covid a week ago and still isnt feeling well. She wants to know if her upcoming appt should be telehealth or should she reschedule it?

## 2024-06-29 ENCOUNTER — Encounter: Payer: Self-pay | Admitting: Nurse Practitioner

## 2024-06-29 ENCOUNTER — Other Ambulatory Visit: Payer: Self-pay

## 2024-06-29 ENCOUNTER — Telehealth: Admitting: Nurse Practitioner

## 2024-06-29 ENCOUNTER — Other Ambulatory Visit (HOSPITAL_COMMUNITY): Payer: Self-pay

## 2024-06-29 VITALS — BP 123/71 | HR 76 | Temp 96.5°F

## 2024-06-29 DIAGNOSIS — R051 Acute cough: Secondary | ICD-10-CM

## 2024-06-29 DIAGNOSIS — U071 COVID-19: Secondary | ICD-10-CM | POA: Insufficient documentation

## 2024-06-29 DIAGNOSIS — R0602 Shortness of breath: Secondary | ICD-10-CM

## 2024-06-29 HISTORY — DX: COVID-19: U07.1

## 2024-06-29 MED ORDER — AZITHROMYCIN 250 MG PO TABS
ORAL_TABLET | ORAL | 0 refills | Status: AC
  Filled 2024-06-29: qty 6, 5d supply, fill #0

## 2024-06-29 MED ORDER — GUAIFENESIN ER 600 MG PO TB12
1200.0000 mg | ORAL_TABLET | Freq: Two times a day (BID) | ORAL | 1 refills | Status: DC | PRN
  Filled 2024-06-29: qty 20, 5d supply, fill #0

## 2024-06-29 MED ORDER — PREDNISONE 20 MG PO TABS
20.0000 mg | ORAL_TABLET | Freq: Every day | ORAL | 0 refills | Status: DC
  Filled 2024-06-29: qty 5, 5d supply, fill #0

## 2024-06-29 NOTE — Progress Notes (Signed)
 Virtual Visit Encounter mychart visit.  I connected with  Adalyn Holan on 06/29/24 at  9:15 AM EDT by secure video and audio telemedicine application. I verified that I am speaking with the correct person using two identifiers.   I introduced myself as a Publishing rights manager with the practice. The limitations of evaluation and management by telemedicine discussed with the patient and the availability of in person appointments. The patient expressed verbal understanding and consent to proceed.  Participating parties in this visit include: Myself and patient  The patient is: Patient Location: Home I am: Provider Location: Office/Clinic Subjective:    CC and HPI: History of Present Illness Cola Forget Ms. Kowalewski or [name] is a 73 year old female with asthma who presents with labored breathing and hemoptysis following a positive COVID-19 diagnosis.  Her symptoms began approximately eight to nine days ago, initially with a sore throat and the expectoration of blood-tinged phlegm. She initially tested negative for COVID-19 with an older test but subsequently tested positive with a new test kit, which showed a positive result within five minutes.  She describes her symptoms as fluctuating, with 'good days and bad days, good hours and bad hours.' Her primary concerns include labored breathing and coughing up phlegm mixed with blood. The first instance of hemoptysis was mostly blood, but subsequent episodes have been less severe, with only a tinge of blood mixed with mucus. The phlegm has varied in color from light brownish to yellow and foamy.  She has been using a nebulizer with albuterol , which has provided relief, allowing her to sleep well. She has also purchased Coricidin HBP for her cough, which she has not yet started. She has a history of asthma and is concerned about her respiratory symptoms.  She experienced issues with her glucose meter, which falsely indicated hypoglycemic incidents,  leading to two nights of poor sleep. This was resolved after visiting the emergency room, where it was determined that the glucose meter was defective. She has not had any significant concerns regarding her blood sugar since then.  She has been self-isolating and has had to cancel plans, including a trip to see a play, due to her illness. She has been managing her symptoms at home, with assistance from a former student who helped walk her dog, Con, while she was unwell.  Past medical history, Surgical history, Family history not pertinant except as noted below, Social history, Allergies, and medications have been entered into the medical record, reviewed, and corrections made.   Review of Systems:  All review of systems negative except what is listed in the HPI  Objective:    Alert and oriented x 4 Cough and audible congestion present.  Speaking in clear sentences with no shortness of breath. No distress.  Impression and Recommendations:    Problem List Items Addressed This Visit   None Visit Diagnoses       COVID-19    -  Primary   Relevant Medications   azithromycin  (ZITHROMAX ) 250 MG tablet   predniSONE  (DELTASONE ) 20 MG tablet   guaiFENesin  (MUCINEX ) 600 MG 12 hr tablet     Shortness of breath       Relevant Medications   predniSONE  (DELTASONE ) 20 MG tablet   guaiFENesin  (MUCINEX ) 600 MG 12 hr tablet     Acute cough       Relevant Medications   azithromycin  (ZITHROMAX ) 250 MG tablet   predniSONE  (DELTASONE ) 20 MG tablet   guaiFENesin  (MUCINEX ) 600 MG 12 hr tablet  orders and follow up as documented in EMR I discussed the assessment and treatment plan with the patient. The patient was provided an opportunity to ask questions and all were answered. The patient agreed with the plan and demonstrated an understanding of the instructions.   The patient was advised to call back or seek an in-person evaluation if the symptoms worsen or if the condition fails to improve  as anticipated.  Follow-Up: prn  I provided 26 minutes of non-face-to-face interaction with this non face-to-face encounter including intake, same-day documentation, and chart review.   [name] CHARLENA Doing, NP , DNP, AGNP-c [facility] Medicine

## 2024-06-29 NOTE — Assessment & Plan Note (Addendum)
 COVID-19 infection ongoing for 8-9 days with respiratory symptoms including cough, shortness of breath, and hemoptysis. Initial test was negative, but subsequent test confirmed COVID-19. Symptoms include labored breathing, phlegm with blood, and cough. Nebulizer treatment with albuterol  has been helpful. Blood in phlegm is likely due to irritation or drainage from the sinus cavity. Persistent symptoms suggest possible secondary bacterial infection. Discussed the use of azithromycin  and prednisone , considering her asthma and potential blood sugar spikes from prednisone . Advised on the use of Mucinex  to help with mucus clearance. Discussed the safety of using Coricidin HBP with her current condition. Emphasized the importance of rest, hydration, and continued use of the nebulizer. Advised on self-isolation until symptoms improve. - Prescribe azithromycin  (Z-Pak) - Prescribe low-dose prednisone  - Continue nebulizer treatment with albuterol  every 4-6 hours as needed, at least twice a day - Recommend Mucinex  1200 mg twice a day - Allow use of Coricidin HBP - Advise rest and increased fluid intake - Advise self-isolation until symptoms improve - Deliver medications through [employer]

## 2024-06-29 NOTE — Patient Instructions (Addendum)
 You can take Mucinex  1200mg  twice a day to help with the mucus in the lungs.  You can also take the Coricidin.   I recommend the nebulizer at least every 12 hours but you can do this every 4-6 hours as needed.   I have sent in Azithromycin  for suspected bacterial secondary infection.  I have sent in prednisone  for reduction of inflammation in the lungs.  I want you to rest as much as you can and drink PLENTY of fluids.  Staying hydrated is more important than food.   You should be good for natural immunity for the next 3 months.

## 2024-06-30 ENCOUNTER — Other Ambulatory Visit (HOSPITAL_COMMUNITY): Payer: Self-pay

## 2024-07-01 ENCOUNTER — Other Ambulatory Visit (HOSPITAL_COMMUNITY): Payer: Self-pay

## 2024-07-01 ENCOUNTER — Ambulatory Visit: Admitting: Licensed Clinical Social Worker

## 2024-07-01 ENCOUNTER — Ambulatory Visit: Payer: Self-pay

## 2024-07-01 NOTE — Telephone Encounter (Signed)
 Noted. I am hopeful that her symptoms will begin to improve since receiving the medication. I am placing an order for CXR in the event she is not feeling better in the next day or so.   Adam,  Please call to see if she is feeling better.  Let her know that I have ordered a chest x-ray at [facility] on [address] and she can walk in to have this done if she continues to have symptoms.  If her symptoms worsen, particularly with breathing, I recommend evaluation in the ED.

## 2024-07-01 NOTE — Telephone Encounter (Signed)
 FYI Only or Action Required?: Action required by provider: update on patient condition.  Patient was last seen in primary care on 06/29/2024 by Early, [name] BRAVO, NP.  Called Nurse Triage reporting Covid Positive.  Symptoms began 06/18/24.  Interventions attempted: OTC medications: Coricidin and Prescription medications: nebulizer .  Symptoms are: productive cough, chest tightness (relieved by nebulizer treatments twice daily), mild SOB with exertion/activity (SpO2 95%) gradually worsening.  Triage Disposition: Call PCP Now  Patient/caregiver understands and will follow disposition?: Yes                Copied from CRM #[id number]. Topic: Clinical - Red Word Triage >> Jul 01, 2024  8:46 AM Pam [name] wrote: Red Word that prompted transfer to Nurse Triage: Patient had a video visit on 08/19 for Covid symptoms. States she has still not gotten the prescriptions delivered and her symptoms are getting worse.  Says her breathing and the tightness in her chest is the most concerning. Reason for Disposition  [1] Caller has URGENT question AND [2] triager unable to answer question  Answer Assessment - Initial Assessment Questions Patient was prescribed medications for symptoms on 06/29/24, she states she received a message from the pharmacy yesterday that the prescriptions were mailed. She states she called around 6pm yesterday and received a tracking # that doesn't work. She is asking if the clinic can follow up with the medications or if she needs to go in person and pick them up. Called pharmacy and spoke with Redell. He states the most recent update on her prescription states as of 6:02am her prescriptions are out for delivery and should be expected today.   1. COVID-19 ONSET: When did the symptoms of COVID-19 first start?     06/18/24.  2. DIAGNOSIS CONFIRMATION: How were you diagnosed? (e.g., COVID-19 oral or nasal viral test; COVID-19 antibody test; doctor visit)     06/21/24, she  states she had a positive home test.  3. MAIN SYMPTOM:  What is your main concern or symptom right now? (e.g., breathing difficulty, cough, fatigue. loss of smell)     Main concern is that patient states she has not received her medications from the pharmacy. Cough, chest tightness (relieved by using her nebulizer twice daily, but she has not done her neb yet this morning).  4. SYMPTOM ONSET: When did the  symptoms  start?     06/18/24.  5. BETTER-SAME-WORSE: Are you getting better, staying the same, or getting worse over the last 1 to 2 weeks?     Getting worse.  6. RECENT MEDICAL VISIT: Have you been seen by a healthcare provider (doctor, NP, PA) for these persisting COVID-19 symptoms? If Yes, ask: When were you seen? (e.g., date)     Yes, she had a virtual visit 06/29/24 with PCP and ED visit 06/22/24 for COVID.  7. COUGH: Do you have a cough? If Yes, ask: How bad is the cough?       Yes, she states she is getting some mucus (milky white, foamy) and it is a bad cough. She states that is why she is concerned about getting her medications.  8. FEVER: Do you have a fever? If Yes, ask: What is your temperature, how was it measured, and when did it start?     No.  9. BREATHING DIFFICULTY: Are you having any trouble breathing? If Yes, ask: How bad is your breathing? (e.g., mild, moderate, severe)    - MILD: No SOB at rest, mild SOB with  walking, speaks normally in sentences, can lie down, no retractions, pulse < 100.    - MODERATE: SOB at rest, SOB with minimal exertion and prefers to sit, cannot lie down flat, speaks in phrases, mild retractions, audible wheezing, pulse 100-120.    - SEVERE: Very SOB at rest, speaks in single words, struggling to breathe, sitting hunched forward, retractions, pulse > 120.       She states no troubled breathing, it's just labored. She states if she is resting in bed she does not have issues but while talking and having a conversation, walking  around and going up or down the stairs she is just noticing she feels her breathing becomes more labored during inhalation. No wheezing noted. Patient speaking in full sentences.  10. OTHER SYMPTOMS: Do you have any other symptoms?  (e.g., fatigue, headache, muscle pain, weakness)       Headache, dizziness  11. HIGH RISK DISEASE: Do you have any chronic medical problems? (e.g., asthma, heart or lung disease, weak immune system, obesity, etc.)       Asthma, diabetes.  12. VACCINE: Have you gotten the COVID-19 vaccine? If Yes, ask: Which one, how many shots, when did you get it?       Yes: [id number], 2024 3 doses (per chart).  13. PREGNANCY: Is there any chance you are pregnant? When was your last menstrual period?       N/A.  14. O2 SATURATION MONITOR:  Do you use an oxygen saturation monitor (pulse oximeter) at home? If Yes, ask What is your reading (oxygen level) today? What is your usual oxygen saturation reading? (e.g., 95%)       95% with HR 70-80s.  Protocols used: Coronavirus (COVID-19) Persisting Symptoms Follow-up Call-[facility]

## 2024-07-02 ENCOUNTER — Encounter: Admitting: Physical Medicine and Rehabilitation

## 2024-07-05 ENCOUNTER — Other Ambulatory Visit (HOSPITAL_COMMUNITY): Payer: Self-pay

## 2024-07-05 ENCOUNTER — Emergency Department (HOSPITAL_BASED_OUTPATIENT_CLINIC_OR_DEPARTMENT_OTHER)

## 2024-07-05 ENCOUNTER — Other Ambulatory Visit: Payer: Self-pay

## 2024-07-05 ENCOUNTER — Encounter (HOSPITAL_BASED_OUTPATIENT_CLINIC_OR_DEPARTMENT_OTHER): Payer: Self-pay

## 2024-07-05 ENCOUNTER — Ambulatory Visit: Payer: Self-pay

## 2024-07-05 ENCOUNTER — Emergency Department (HOSPITAL_BASED_OUTPATIENT_CLINIC_OR_DEPARTMENT_OTHER)
Admission: EM | Admit: 2024-07-05 | Discharge: 2024-07-05 | Disposition: A | Discharge status: Home / Self Care | Attending: Emergency Medicine | Admitting: Emergency Medicine

## 2024-07-05 ENCOUNTER — Ambulatory Visit: Admitting: Internal Medicine

## 2024-07-05 ENCOUNTER — Emergency Department (HOSPITAL_BASED_OUTPATIENT_CLINIC_OR_DEPARTMENT_OTHER): Admitting: Radiology

## 2024-07-05 DIAGNOSIS — Z7982 Long term (current) use of aspirin: Secondary | ICD-10-CM | POA: Diagnosis not present

## 2024-07-05 DIAGNOSIS — R0602 Shortness of breath: Secondary | ICD-10-CM

## 2024-07-05 DIAGNOSIS — J209 Acute bronchitis, unspecified: Secondary | ICD-10-CM | POA: Insufficient documentation

## 2024-07-05 DIAGNOSIS — E119 Type 2 diabetes mellitus without complications: Secondary | ICD-10-CM | POA: Insufficient documentation

## 2024-07-05 DIAGNOSIS — I10 Essential (primary) hypertension: Secondary | ICD-10-CM | POA: Insufficient documentation

## 2024-07-05 DIAGNOSIS — I2510 Atherosclerotic heart disease of native coronary artery without angina pectoris: Secondary | ICD-10-CM | POA: Diagnosis not present

## 2024-07-05 DIAGNOSIS — U071 COVID-19: Secondary | ICD-10-CM

## 2024-07-05 DIAGNOSIS — R079 Chest pain, unspecified: Secondary | ICD-10-CM | POA: Diagnosis not present

## 2024-07-05 DIAGNOSIS — R051 Acute cough: Secondary | ICD-10-CM

## 2024-07-05 DIAGNOSIS — R918 Other nonspecific abnormal finding of lung field: Secondary | ICD-10-CM | POA: Diagnosis not present

## 2024-07-05 DIAGNOSIS — Z91040 Latex allergy status: Secondary | ICD-10-CM | POA: Insufficient documentation

## 2024-07-05 LAB — COMPREHENSIVE METABOLIC PANEL WITH GFR
ALT: 11 U/L (ref 0–44)
AST: 18 U/L (ref 15–41)
Albumin: 4.6 g/dL (ref 3.5–5.0)
Alkaline Phosphatase: 71 U/L (ref 38–126)
Anion gap: 12 (ref 5–15)
BUN: 11 mg/dL (ref 8–23)
CO2: 25 mmol/L (ref 22–32)
Calcium: 10 mg/dL (ref 8.9–10.3)
Chloride: 103 mmol/L (ref 98–111)
Creatinine, Ser: 0.69 mg/dL (ref 0.44–1.00)
GFR, Estimated: >60 mL/min (ref >60)
Glucose, Bld: 152 mg/dL — ABNORMAL HIGH (ref 70–99)
Potassium: 4.1 mmol/L (ref 3.5–5.1)
Sodium: 139 mmol/L (ref 135–145)
Total Bilirubin: 0.2 mg/dL (ref 0.0–1.2)
Total Protein: 7.9 g/dL (ref 6.5–8.1)

## 2024-07-05 LAB — CBC WITH DIFFERENTIAL/PLATELET
Abs Immature Granulocytes: 0.01 K/uL (ref 0.00–0.07)
Basophils Absolute: 0 K/uL (ref 0.0–0.1)
Basophils Relative: 0 %
Eosinophils Absolute: 0 K/uL (ref 0.0–0.5)
Eosinophils Relative: 0 %
HCT: 39.9 % (ref 36.0–46.0)
Hemoglobin: 13.4 g/dL (ref 12.0–15.0)
Immature Granulocytes: 0 %
Lymphocytes Relative: 19 %
Lymphs Abs: 0.9 K/uL (ref 0.7–4.0)
MCH: 26.6 pg (ref 26.0–34.0)
MCHC: 33.6 g/dL (ref 30.0–36.0)
MCV: 79.3 fL — ABNORMAL LOW (ref 80.0–100.0)
Monocytes Absolute: 0.1 K/uL (ref 0.1–1.0)
Monocytes Relative: 2 %
Neutro Abs: 3.9 K/uL (ref 1.7–7.7)
Neutrophils Relative %: 79 %
Platelets: 321 K/uL (ref 150–400)
RBC: 5.03 MIL/uL (ref 3.87–5.11)
RDW: 13.4 % (ref 11.5–15.5)
WBC: 4.9 K/uL (ref 4.0–10.5)
nRBC: 0 % (ref 0.0–0.2)

## 2024-07-05 LAB — PRO BRAIN NATRIURETIC PEPTIDE: Pro Brain Natriuretic Peptide: 98 pg/mL (ref <300.0)

## 2024-07-05 LAB — TROPONIN T, HIGH SENSITIVITY: Troponin T High Sensitivity: <15 ng/L (ref 0–19)

## 2024-07-05 MED ORDER — METHYLPREDNISOLONE 4 MG PO TBPK: ORAL_TABLET | ORAL | 0 refills | Status: DC

## 2024-07-05 MED ORDER — DIPHENHYDRAMINE HCL 25 MG PO CAPS: 50.0000 mg | ORAL_CAPSULE | Freq: Once | ORAL | Status: AC

## 2024-07-05 MED ORDER — BENZONATATE 100 MG PO CAPS: 100.0000 mg | ORAL_CAPSULE | Freq: Three times a day (TID) | ORAL | 0 refills | Status: DC

## 2024-07-05 MED ORDER — IPRATROPIUM-ALBUTEROL 0.5-2.5 (3) MG/3ML IN SOLN
3.0000 mL | Freq: Once | RESPIRATORY_TRACT | Status: AC
  Administered 2024-07-05: 3 mL via RESPIRATORY_TRACT
  Filled 2024-07-05: qty 3

## 2024-07-05 MED ORDER — ACETAMINOPHEN-CODEINE 300-30 MG PO TABS: 1.0000 | ORAL_TABLET | Freq: Four times a day (QID) | ORAL | 0 refills | Status: DC | PRN

## 2024-07-05 MED ORDER — IOHEXOL 350 MG/ML SOLN
75.0000 mL | Freq: Once | INTRAVENOUS | Status: AC | PRN
  Administered 2024-07-05: 75 mL via INTRAVENOUS

## 2024-07-05 MED ORDER — METHYLPREDNISOLONE SODIUM SUCC 40 MG IJ SOLR
40.0000 mg | Freq: Once | INTRAMUSCULAR | Status: AC
  Administered 2024-07-05: 40 mg via INTRAVENOUS
  Filled 2024-07-05: qty 1

## 2024-07-05 MED ORDER — DIPHENHYDRAMINE HCL 50 MG/ML IJ SOLN
50.0000 mg | Freq: Once | INTRAMUSCULAR | Status: AC
  Administered 2024-07-05: 50 mg via INTRAVENOUS
  Filled 2024-07-05: qty 1

## 2024-07-05 NOTE — ED Notes (Signed)
 Patient ambulated with assistance of her cane to restroom with steady gait.

## 2024-07-05 NOTE — ED Triage Notes (Signed)
 Pt c/o SHOB, CP; advises URI symptoms. Neb & inhaler at home w relief, but worsens throughout day.  17th day of covid, concerned for continuation of symptoms

## 2024-07-05 NOTE — ED Notes (Signed)
 RT in to assess patient. Patient has persistent productive cough. Diminished breath sounds bilaterally. SpO2 98%. Per patient, PCP instructed patient to take a nebulizer treatment twice daily. Last administration was this AM. Patient states gets some temporary relief from nebulizers.

## 2024-07-05 NOTE — ED Provider Notes (Signed)
 [place] EMERGENCY DEPARTMENT AT [facility] Provider Note   CSN: [id number] Arrival date & time: 07/05/24  1353     Patient presents with: Covid Positive and Shortness of Breath   [name] is a 73 y.o. female.  {Add pertinent medical, surgical, social history, OB history to HPI:32947}  Shortness of Breath Associated symptoms: chest pain and cough   Associated symptoms: no fever      73 year old female with medical history significant for fibromyalgia, DM2, OSA, HTN, HLD, polymyositis, CAD, GERD presenting to the emergency department with cough and shortness of breath.  The patient states that she was diagnosed with COVID-19 17 days ago.  She has had persistent symptoms.  She states that she had mild hemoptysis early on in the infection.  She has had no recent fevers.  She has had persistent sharp right-sided chest discomfort, pleuritic discomfort, mild shortness of breath.  Her cough is intermittently been dry and intermittently has been productive.  Earlier this morning, she woke up with substernal burning.  She has been utilizing nebulizers at at home without significant relief.  Prior to Admission medications   Medication Sig Start Date End Date Taking? Authorizing Provider  acetaminophen  (TYLENOL ) 500 MG tablet Take 500 mg by mouth as needed for mild pain or moderate pain.    [provider]  albuterol  (VENTOLIN  HFA) 108 (90 Base) MCG/ACT inhaler Inhale 1 puff into the lungs every 4 (four) hours as needed. 01/20/24   Marinda Rocky SAILOR, MD  aspirin  EC 81 MG tablet Take 1 tablet (81 mg total) by mouth daily. Swallow whole. 08/07/22   Cheryle Page, MD  azithromycin  (ZITHROMAX ) 250 MG tablet Take 2 tablets on day 1, then 1 tablet daily on days 2 through 5 06/29/24 07/05/24  Early, Sara E, NP  budesonide  (PULMICORT ) 0.5 MG/2ML nebulizer solution Take 2 mLs (0.5 mg total) by nebulization 2 (two) times daily. During respiratory flares for at least 2 weeks or  until symptoms resolve. 09/24/22   Marinda Rocky SAILOR, MD  budesonide -formoterol  (SYMBICORT ) 160-4.5 MCG/ACT inhaler Inhale 2 puffs into the lungs 2 (two) times daily. 05/07/24   Cari Arlean HERO, FNP  Calcium  250 MG CAPS Take by mouth.    [provider]  Cholecalciferol (VITAMIN D3) 50 MCG (2000 UT) CAPS Take by mouth.    [provider]  co-enzyme Q-10 30 MG capsule Take 30 mg by mouth 3 (three) times daily.    [provider]  Continuous Glucose Sensor (FREESTYLE LIBRE 3 PLUS SENSOR) MISC Change sensor every 15 days. 01/20/24   Early, Sara E, NP  Evolocumab  (REPATHA  SURECLICK) 140 MG/ML SOAJ Inject 140 mg into the skin every 14 (fourteen) days. 04/14/23   Vannie Reche RAMAN, NP  guaiFENesin  (MUCINEX ) 600 MG 12 hr tablet Take 2 tablets (1,200 mg total) by mouth 2 (two) times daily as needed for cough or to loosen phlegm. 06/29/24   Early, Sara E, NP  ketoconazole  (NIZORAL ) 2 % cream APPLY 1 APPLICATION TOPICALLY 2 (TWO) TIMES DAILY. TO AFFECTED AREAS. 07/03/23   Early, Sara E, NP  levocetirizine (XYZAL ) 5 MG tablet Take 1 tablet (5 mg total) by mouth every evening. 05/07/24   Ambs, Arlean HERO, FNP  Magnesium  100 MG CAPS Take by mouth.    [provider]  mometasone  (NASONEX ) 50 MCG/ACT nasal spray Place 2 sprays into the nose daily. 06/28/24   Cari Arlean HERO, FNP  montelukast  (SINGULAIR ) 10 MG tablet Take 1 tablet (10  mg total) by mouth at bedtime. 05/07/24   Cari Arlean HERO, FNP  Multiple Vitamin (MULTIVITAMIN WITH MINERALS) TABS tablet Take 1 tablet by mouth daily.    [provider]  nitroGLYCERIN  (NITROSTAT ) 0.4 MG SL tablet Place 1 tablet (0.4 mg total) under the tongue every 5 (five) minutes as needed for chest pain. 08/06/22   Cheryle Page, MD  Omega-3 Fatty Acids (FISH OIL) 300 MG CAPS Take by mouth.    [provider]  Polyethyl Glycol-Propyl Glycol (SYSTANE FREE OP) Apply 2 drops to eye daily as needed (both eyes as needed).    [provider]   predniSONE  (DELTASONE ) 20 MG tablet Take 1 tablet (20 mg total) by mouth daily with breakfast. 06/29/24   Early, Sara E, NP  rosuvastatin  (CRESTOR ) 5 MG tablet Take 1 tablet (5 mg total) by mouth 3 (three) times a week. Every Monday, Wednesday, & Friday 09/03/23   Vannie Reche RAMAN, NP  Semaglutide , 1 MG/DOSE, 4 MG/3ML SOPN Inject 1 mg as directed once a week. 10/01/23   Early, Sara E, NP  Vitamin D , Ergocalciferol , (DRISDOL ) 1.25 MG (50000 UNIT) CAPS capsule Take 1 capsule (50,000 Units total) by mouth every 7 (seven) days. 01/20/24   Early, Sara E, NP    Allergies: Iodine, Ivp dye [iodinated contrast media], Amoxicillin, Gadobenate, Latex, and Penicillins    Review of Systems  Constitutional:  Negative for fever.  Respiratory:  Positive for cough and shortness of breath.   Cardiovascular:  Positive for chest pain.  All other systems reviewed and are negative.   Updated Vital Signs BP (!) 155/74 (BP Location: Right Arm)   Pulse (!) 106   Temp 99 F (37.2 C) (Oral)   Resp 18   SpO2 96%   Physical Exam Vitals and nursing note reviewed.  Constitutional:      General: She is not in acute distress.    Appearance: She is well-developed.  HENT:     Head: Normocephalic and atraumatic.  Eyes:     Conjunctiva/sclera: Conjunctivae normal.  Cardiovascular:     Rate and Rhythm: Normal rate and regular rhythm.     Heart sounds: No murmur heard. Pulmonary:     Effort: Pulmonary effort is normal. No respiratory distress.     Breath sounds: Normal breath sounds.  Chest:     Comments: Right sided chest wall TTP Abdominal:     Palpations: Abdomen is soft.     Tenderness: There is no abdominal tenderness.  Musculoskeletal:        General: No swelling.     Cervical back: Neck supple.  Skin:    General: Skin is warm and dry.     Capillary Refill: Capillary refill takes less than 2 seconds.  Neurological:     Mental Status: She is alert.  Psychiatric:        Mood and Affect: Mood  normal.     (all labs ordered are listed, but only abnormal results are displayed) Labs Reviewed - No data to display  EKG: None  Radiology: DG Chest 2 View Result Date: 07/05/2024 CLINICAL DATA:  Shortness of breath, chest pain and upper respiratory tract infection symptoms. EXAM: CHEST - 2 VIEW COMPARISON:  06/22/2024 and chest, abdomen and pelvis CTA dated 08/24/2024. FINDINGS: Normal sized heart. Clear lungs with normal vascularity. Interval small ill-defined nodular density overlying the right upper lung zone and posterior right 6th rib on the frontal view, not seen on the lateral view. No corresponding density on the  recent radiographs or previous CT. Mild thoracic spine degenerative changes. Cholecystectomy clips. IMPRESSION: Interval small ill-defined nodular density overlying the right upper lung zone and posterior right 6th rib on the frontal view, not seen on the lateral view. This could represent a small area of pneumonia or atypical focal infection. A true nodule is unlikely since this was not seen on chest radiographs 13 days ago. Recommend follow-up PA and lateral chest radiographs in 3-4 weeks unless a chest CT or CTA is obtained in the interim. Electronically Signed   By: Elspeth Bathe M.D.   On: 07/05/2024 15:15    {Document cardiac monitor, telemetry assessment procedure when appropriate:32947} Procedures   Medications Ordered in the ED - No data to display    {Click here for ABCD2, HEART and other calculators REFRESH Note before signing:1}                              Medical Decision Making Amount and/or Complexity of Data Reviewed Labs: ordered. Radiology: ordered.  Risk Prescription drug management.     73 year old female with medical history significant for fibromyalgia, DM2, OSA, HTN, HLD, polymyositis, CAD, GERD presenting to the emergency department with cough and shortness of breath.  The patient states that she was diagnosed with COVID-19 17 days ago.  She  has had persistent symptoms.  She states that she had mild hemoptysis early on in the infection.  She has had no recent fevers.  She has had persistent sharp right-sided chest discomfort, pleuritic discomfort, mild shortness of breath.  Her cough is intermittently been dry and intermittently has been productive.  Earlier this morning, she woke up with substernal burning.  She has been utilizing nebulizers at at home without significant relief.  On arrival, the patient was afebrile, temperature 99, heart rate mildly tachycardic 106, respiratory rate 18, BP 155/74, saturating 96% on room air.  Physical exam generally unremarkable, lungs clear to auscultation all lung fields.  CTA PE:  IMPRESSION:  1. No pulmonary embolus.  2. No acute intrathoracic abnormality including three-vessel  coronary artery calcification.    Symptoms are consistent with acute bronchitis. Will DC on Prednisone , Zpak, Tessalon , and recommended she follow-up with her PCP.     {Document critical care time when appropriate  Document review of labs and clinical decision tools ie CHADS2VASC2, etc  Document your independent review of radiology images and any outside records  Document your discussion with family members, caretakers and with consultants  Document social determinants of health affecting pt's care  Document your decision making why or why not admission, treatments were needed:32947:::1}   Final diagnoses:  None    ED Discharge Orders     None

## 2024-07-05 NOTE — Discharge Instructions (Addendum)
 Your CT imaging was negative for evidence of blood clot or pneumonia.  Your symptoms are likely consistent with acute bronchitis likely induced by COVID.  Will treat you with medications, finish your Z-Pak which had been previously prescribed and we will start you on cough medicine as well as a steroid pack.  Follow-up with your primary care provider.

## 2024-07-05 NOTE — Telephone Encounter (Signed)
 FYI Only or Action Required?: FYI only for provider.  Patient was last seen in primary care on 06/29/2024 by Early, [name] BRAVO, NP.  Called Nurse Triage reporting Shortness of Breath and Cough.  Symptoms began several weeks ago.  Interventions attempted: Prescription medications: prednisone , azithromycin , Rest, hydration, or home remedies, and Other: inhalers and nebulizer.  Symptoms are: unchanged.  Triage Disposition: Go to ED Now (Notify PCP)  Patient/caregiver understands and will follow disposition?: Yes  Copied from CRM #[id number]. Topic: Clinical - Red Word Triage >> Jul 05, 2024  9:07 AM Suzen RAMAN wrote: Red Word that prompted transfer to Nurse Triage: SOB in the am and pm, productive cough, headaches, COVID(+) 2 weeks ago, seeking another  order for chest x-ray Reason for Disposition  [1] MODERATE difficulty breathing (e.g., speaks in phrases, SOB even at rest, pulse 100-120) AND [2] NEW-onset or WORSE than normal  Answer Assessment - Initial Assessment Questions 1. RESPIRATORY STATUS: Describe your breathing? (e.g., wheezing, shortness of breath, unable to speak, severe coughing)      Shortness of breath, coughing 2. ONSET: When did this breathing problem begin?      Shortness of breath started over two weeks ago. SOB has gotten progressively worse.  3. PATTERN Does the difficult breathing come and go, or has it been constant since it started?      constant 4. SEVERITY: How bad is your breathing? (e.g., mild, moderate, severe)      Mild-moderate 5. RECURRENT SYMPTOM: Have you had difficulty breathing before? If Yes, ask: When was the last time? and What happened that time?      Yes but its has been quite awhile 6. CARDIAC HISTORY: Do you have any history of heart disease? (e.g., heart attack, angina, bypass surgery, angioplasty)      CAD 7. LUNG HISTORY: Do you have any history of lung disease?  (e.g., pulmonary embolus, asthma, emphysema)     asthma 8.  CAUSE: What do you think is causing the breathing problem?      COVID positive two weeks ago 9. OTHER SYMPTOMS: Do you have any other symptoms? (e.g., chest pain, cough, dizziness, fever, runny nose)     Headache, cough 10. O2 SATURATION MONITOR:  Do you use an oxygen saturation monitor (pulse oximeter) at home? If Yes, ask: What is your reading (oxygen level) today? What is your usual oxygen saturation reading? (e.g., 95%)       96% 12. TRAVEL: Have you traveled out of the country in the last month? (e.g., travel history, exposures)       no  COVID positive two weeks ago today. Patient attempted to call [facility] today as she was told there was an order for an XR for her there. Patient was told there was no active order for a chest XR. Patient initially calling to get the order sent over again. Patient endorses productive cough with moderate shortness of breath. Patient reports using her inhalers and nebulizer which does help but patient reports her symptoms seem to get worse throughout the day. Patient recommended to the ED for evaluation.  Protocols used: Breathing Difficulty-A-AH

## 2024-07-07 ENCOUNTER — Telehealth: Admitting: Nurse Practitioner

## 2024-07-07 DIAGNOSIS — R519 Headache, unspecified: Secondary | ICD-10-CM | POA: Diagnosis not present

## 2024-07-07 DIAGNOSIS — J40 Bronchitis, not specified as acute or chronic: Secondary | ICD-10-CM | POA: Diagnosis not present

## 2024-07-07 NOTE — Progress Notes (Signed)
 Virtual Visit Consent   Pam Pam [name], you are scheduled for a virtual visit with a [facility] provider today. Just as with appointments in the office, your consent must be obtained to participate. Your consent will be active for this visit and any virtual visit you may have with one of our providers in the next 365 days. If you have a MyChart account, a copy of this consent can be sent to you electronically.  As this is a virtual visit, video technology does not allow for your provider to perform a traditional examination. This may limit your provider's ability to fully assess your condition. If your provider identifies any concerns that need to be evaluated in person or the need to arrange testing (such as labs, EKG, etc.), we will make arrangements to do so. Although advances in technology are sophisticated, we cannot ensure that it will always work on either your end or our end. If the connection with a video visit is poor, the visit may have to be switched to a telephone visit. With either a video or telephone visit, we are not always able to ensure that we have a secure connection.  By engaging in this virtual visit, you consent to the provision of healthcare and authorize for your insurance to be billed (if applicable) for the services provided during this visit. Depending on your insurance coverage, you may receive a charge related to this service.  I need to obtain your verbal consent now. Are you willing to proceed with your visit today? Pam Pam [name] has provided verbal consent on 07/07/2024 for a virtual visit (video or telephone). Pam Kitty, FNP  Date: 07/07/2024 6:43 PM   Virtual Visit via Video Note   I, Pam Pam [name], connected with  Pam Pam [name]  ([id number], 1951/10/02) on 07/07/24 at  6:45 PM EDT by a video-enabled telemedicine application and verified that I am speaking with the correct person using two identifiers.  Location: Patient: Virtual Visit Location Patient:  Home Provider: Virtual Visit Location Provider: Home Office   I discussed the limitations of evaluation and management by telemedicine and the availability of in person appointments. The patient expressed understanding and agreed to proceed.    History of Present Illness: Pam Pam [name] is a 73 y.o. who identifies as a female who was assigned female at birth, and is being seen today for follow up from recent ED visit.  Significant medical history includes having COVID-19 diagnosed earlier this month, follow up with PCP who treated her for suspected pneumonia 9 days post COVID infection.  She was treated with azithromycin /prednisone  and was using mucinex  for support. This treatment plan started on 06/29/24, she was not improving on day 5 and was sent to ED by triage nurse    She was in the ED 2 days ago, had a CT Angio chest at that time IMPRESSION: 1. Pam [name] pulmonary embolus. 2. Pam [name] acute intrathoracic abnormality including three-vessel coronary artery calcification.  She was discharged with viral support, repeat steroid (Medrol  dospak) and the tessalon  pearls. She has not used the Tylenol  #3.  Today she has new symptoms including a HA and left collar bone pain. She started to have a headache on the left side of her head within the past 20-30 minutes, and she has pain in her left shoulder that also started about 20-30 minutes ago. The pain in her shoulder is more present when she coughs, the headache is consistent. She admits she might be dehydrated she has been drinking tea most of the  day.   She did have breakfast, had an egg/cheese and sausage (all plant based)  Patient is very nutritional conscious and counts her carbs and is able to recount everything she ate today  Lunch veggie burger   She does have a significant history of anxiety surrounding her health, she lives alone with her dog that she has had to board recently because it became too much  She has a history of asthma has not been  using her inhalers or her allergy medicine because she was unsure if it could be taken with the other medicine   Problems:  Patient Active Problem List   Diagnosis Date Noted   COVID-19 06/29/2024   Balance disorder 06/22/2024   Social isolation 06/22/2024   Adjustment disorder with mixed anxiety and depressed mood 06/17/2024   Major depressive disorder, recurrent episode, mild with anxious distress (HCC) 03/23/2024   Mild neurocognitive disorder due to multiple etiologies 03/23/2024   Spondylosis without myelopathy or radiculopathy, lumbar region 02/06/2024   Anxiety about health 08/20/2023   NAFLD (nonalcoholic fatty liver disease) 89/90/7975   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   Fecal smearing 07/09/2023   Seasonal and perennial allergic rhinitis 04/09/2023   Benign essential microscopic hematuria 03/10/2023   Encounter for weight management 03/06/2023   Vestibular hypofunction, bilateral 03/06/2023   Coronary artery disease of native artery of native heart with stable angina pectoris (HCC) 01/02/2023   Hypoglycemia 12/18/2022   Keratosis nigricans 04/03/2022   Irritable bowel syndrome with diarrhea 12/11/2021   Chronic sore throat 08/10/2021   Gastroesophageal reflux disease 02/13/2019   Fibrocystic breast changes of both breasts 02/13/2019   Detrusor instability of bladder 08/10/2018   Vertigo 08/10/2018   Uterine leiomyoma 08/10/2018   Obstructive sleep apnea 08/10/2018   Hearing loss 08/10/2018   Memory loss 05/29/2018   Hyperlipidemia associated with type 2 diabetes mellitus (HCC) 08/22/2013   Moderate persistent asthma, uncomplicated 08/19/2013   Chest wall pain 02/24/2012   Fibromyalgia    Polymyositis (HCC) 12/20/2010   Postmenopausal atrophic vaginitis 09/25/2010   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  Contrast dye   Latex Itching and Rash   Penicillins Other (See Comments) and Rash    Diarrhea, rash   Medications:  Current Outpatient Medications:    acetaminophen  (TYLENOL ) 500 MG tablet, Take 500 mg by mouth as needed for mild pain or moderate pain., Disp: , Rfl:    acetaminophen -codeine  (TYLENOL  #3) 300-30 MG tablet, Take 1-2 tablets by mouth every 6 (six) hours as needed for moderate pain (pain score 4-6)., Disp: 15 tablet, Rfl: 0   albuterol  (VENTOLIN  HFA) 108 (90 Base) MCG/ACT inhaler, Inhale 1 puff into the lungs every 4 (four) hours as needed., Disp: 18 g, Rfl: 1   aspirin  EC 81 MG tablet, Take 1 tablet (81 mg total) by mouth daily. Swallow whole., Disp: 30 tablet, Rfl: 0   benzonatate  (TESSALON ) 100 MG capsule, Take 1 capsule (100 mg total) by mouth every 8 (eight) hours., Disp: 21 capsule, Rfl: 0   budesonide  (PULMICORT ) 0.5 MG/2ML nebulizer solution, Take 2 mLs (  0.5 mg total) by nebulization 2 (two) times daily. During respiratory flares for at least 2 weeks or until symptoms resolve., Disp: 90 mL, Rfl: 1   budesonide -formoterol  (SYMBICORT ) 160-4.5 MCG/ACT inhaler, Inhale 2 puffs into the lungs 2 (two) times daily., Disp: 10.2 g, Rfl: 5   Calcium  250 MG CAPS, Take by mouth., Disp: , Rfl:    Cholecalciferol (VITAMIN D3) 50 MCG (2000 UT) CAPS, Take by mouth., Disp: , Rfl:    co-enzyme Q-10 30 MG capsule, Take 30 mg by mouth 3 (three) times daily., Disp: , Rfl:    Continuous Glucose Sensor (FREESTYLE LIBRE 3 PLUS SENSOR) MISC, Change sensor every 15 days., Disp: 2 each, Rfl: 11   Evolocumab  (REPATHA  SURECLICK) 140 MG/ML SOAJ, Inject 140 mg into the skin every 14 (fourteen) days., Disp: 2 mL, Rfl: 11   guaiFENesin  (MUCINEX ) 600 MG  12 hr tablet, Take 2 tablets (1,200 mg total) by mouth 2 (two) times daily as needed for cough or to loosen phlegm., Disp: 24 tablet, Rfl: 1   ketoconazole  (NIZORAL ) 2 % cream, APPLY 1 APPLICATION TOPICALLY 2 (TWO) TIMES DAILY. TO AFFECTED AREAS., Disp: 60 g, Rfl: 3   levocetirizine (XYZAL ) 5 MG tablet, Take 1 tablet (5 mg total) by mouth every evening., Disp: 90 tablet, Rfl: 3   Magnesium  100 MG CAPS, Take by mouth., Disp: , Rfl:    methylPREDNISolone  (MEDROL  DOSEPAK) 4 MG TBPK tablet, Take as directed on the package, Disp: 1 each, Rfl: 0   mometasone  (NASONEX ) 50 MCG/ACT nasal spray, Place 2 sprays into the nose daily., Disp: 51 g, Rfl: 0   montelukast  (SINGULAIR ) 10 MG tablet, Take 1 tablet (10 mg total) by mouth at bedtime., Disp: 90 tablet, Rfl: 3   Multiple Vitamin (MULTIVITAMIN WITH MINERALS) TABS tablet, Take 1 tablet by mouth daily., Disp: , Rfl:    nitroGLYCERIN  (NITROSTAT ) 0.4 MG SL tablet, Place 1 tablet (0.4 mg total) under the tongue every 5 (five) minutes as needed for chest pain., Disp: 30 tablet, Rfl: 0   Omega-3 Fatty Acids (FISH OIL) 300 MG CAPS, Take by mouth., Disp: , Rfl:    Polyethyl Glycol-Propyl Glycol (SYSTANE FREE OP), Apply 2 drops to eye daily as needed (both eyes as needed)., Disp: , Rfl:    predniSONE  (DELTASONE ) 20 MG tablet, Take 1 tablet (20 mg total) by mouth daily with breakfast., Disp: 5 tablet, Rfl: 0   rosuvastatin  (CRESTOR ) 5 MG tablet, Take 1 tablet (5 mg total) by mouth 3 (three) times a week. Every Monday, Wednesday, & Friday, Disp: 36 tablet, Rfl: 3   Semaglutide , 1 MG/DOSE, 4 MG/3ML SOPN, Inject 1 mg as directed once a week., Disp: 9 mL, Rfl: 3   Vitamin D , Ergocalciferol , (DRISDOL ) 1.25 MG (50000 UNIT) CAPS capsule, Take 1 capsule (50,000 Units total) by mouth every 7 (seven) days., Disp: 12 capsule, Rfl: 3  Observations/Objective: Patient is well-developed, well-nourished in Pam [name] acute distress.  Resting comfortably  at home.  Head is normocephalic,  atraumatic.  Pam [name] labored breathing.  Speech is clear and coherent with logical content.  Patient is alert and oriented at baseline.   Home monitor device: SpO2 96%  Pulse 78   Assessment and Plan:  1. Acute nonintractable headache, unspecified headache type (Primary) Encouraged non caffeine beverages to help with hydration   2. Bronchitis Continue regimen from ED may hold Tylenol  #3 if tessalon  pearls if those are controlling her cough efficiently   Restart inhalers as ordered and allergy medication   Reassured  patient     Follow Up Instructions:  I discussed the assessment and treatment plan with the patient. The patient was provided an opportunity to ask questions and all were answered. The patient agreed with the plan and demonstrated an understanding of the instructions.  A copy of instructions were sent to the patient via MyChart unless otherwise noted below.   The patient was advised to call back or seek an in-person evaluation if the symptoms worsen or if the condition fails to improve as anticipated.    Pam Kitty, FNP

## 2024-07-08 ENCOUNTER — Other Ambulatory Visit (HOSPITAL_COMMUNITY): Payer: Self-pay

## 2024-07-09 ENCOUNTER — Other Ambulatory Visit (HOSPITAL_COMMUNITY): Payer: Self-pay

## 2024-07-14 ENCOUNTER — Other Ambulatory Visit: Payer: Self-pay

## 2024-07-21 ENCOUNTER — Other Ambulatory Visit: Payer: Self-pay

## 2024-07-21 ENCOUNTER — Other Ambulatory Visit (HOSPITAL_COMMUNITY): Payer: Self-pay

## 2024-07-26 ENCOUNTER — Other Ambulatory Visit (HOSPITAL_COMMUNITY): Payer: Self-pay

## 2024-07-26 ENCOUNTER — Encounter: Payer: Self-pay | Admitting: Nurse Practitioner

## 2024-07-26 ENCOUNTER — Ambulatory Visit: Admitting: Nurse Practitioner

## 2024-07-26 ENCOUNTER — Ambulatory Visit: Admitting: Internal Medicine

## 2024-07-26 ENCOUNTER — Ambulatory Visit
Admission: RE | Admit: 2024-07-26 | Discharge: 2024-07-26 | Disposition: A | Discharge status: Home / Self Care | Source: Ambulatory Visit | Attending: Nurse Practitioner

## 2024-07-26 VITALS — BP 130/82 | HR 92 | Wt 149.4 lb

## 2024-07-26 DIAGNOSIS — E1149 Type 2 diabetes mellitus with other diabetic neurological complication: Secondary | ICD-10-CM | POA: Diagnosis not present

## 2024-07-26 DIAGNOSIS — M25572 Pain in left ankle and joints of left foot: Secondary | ICD-10-CM

## 2024-07-26 DIAGNOSIS — J40 Bronchitis, not specified as acute or chronic: Secondary | ICD-10-CM | POA: Insufficient documentation

## 2024-07-26 DIAGNOSIS — Z8659 Personal history of other mental and behavioral disorders: Secondary | ICD-10-CM | POA: Diagnosis not present

## 2024-07-26 DIAGNOSIS — F4323 Adjustment disorder with mixed anxiety and depressed mood: Secondary | ICD-10-CM | POA: Diagnosis not present

## 2024-07-26 DIAGNOSIS — R4020 Unspecified coma: Secondary | ICD-10-CM

## 2024-07-26 DIAGNOSIS — Z8669 Personal history of other diseases of the nervous system and sense organs: Secondary | ICD-10-CM | POA: Insufficient documentation

## 2024-07-26 LAB — LIPID PANEL

## 2024-07-26 MED ORDER — DOXYCYCLINE HYCLATE 100 MG PO TABS
100.0000 mg | ORAL_TABLET | Freq: Two times a day (BID) | ORAL | 0 refills | Status: DC
  Filled 2024-07-26: qty 14, 7d supply, fill #0

## 2024-07-26 MED ORDER — PREDNISONE 20 MG PO TABS
20.0000 mg | ORAL_TABLET | Freq: Every day | ORAL | 0 refills | Status: DC
  Filled 2024-07-26: qty 6, 6d supply, fill #0

## 2024-07-26 NOTE — Assessment & Plan Note (Signed)
 Type 2 diabetes is well controlled with current management. No significant changes in blood sugar levels, even with recent prednisone  use. - Continue current diabetes management plan. - Monitor blood sugar levels, especially during prednisone  treatment.

## 2024-07-26 NOTE — Assessment & Plan Note (Signed)
 Reports of feeling overwhelmed and loss of control, possibly exacerbated by current health issues and environmental stressors. Impact of social isolation and environmental factors on mental health discussed. - Encourage seeking therapy for emotional support. - Consider environmental changes to reduce stress, such as organizing living space or taking a short respite.

## 2024-07-26 NOTE — Assessment & Plan Note (Signed)
 Right ankle sprain with swelling for five days following a fall. Swelling increases when the ankle brace is removed. Able to bear weight but with difficulty. - Order x-ray of the right ankle to rule out fracture. - Advise continued icing and elevation of the ankle.

## 2024-07-26 NOTE — Assessment & Plan Note (Signed)
 Never on therapy in the past. Reports awakening at night, emotional lability, increased daytime fatigue, and memory changes. This does make me question a potential correlation with sleep disorder that could be affecting her mental state due to non-restful sleep. Will send referral.

## 2024-07-26 NOTE — Progress Notes (Signed)
 Pam Doing, DNP, AGNP-c [facility]  [address] [place], [place] [address] [phone]  ESTABLISHED PATIENT- Chronic Health and/or Follow-Up Visit on 07/26/2024  Blood pressure 130/82, pulse 92, weight 149 lb 6.4 oz (67.8 kg).   HPI: History of Present Illness Pam [name] Pam [name] or Pam [name] is a 73 year old female who presents with persistent cough, dizziness, and recent episodes of loss of consciousness.  She has a persistent cough with night sweats and thick, clear phlegm. She has been using a nebulizer. She consumes a daily mixture of lemonade with ginger and a tea bag containing ginger and zinc. She has a history of severe bronchitis as a child and has not experienced bronchitis since moving to a dry climate until recently. She recently had COVID-19 (about 3 weeks ago) and this is a residual effect from that infection. She is very anxious about the possibility for serious illness related to the ongoing symptoms. She has not shortness of breath at rest, but does report mild dyspnea with exertion.   She describes two episodes of loss of consciousness over the past year and a half. The most recent episode occurred five days ago, where she found herself on the doormat outside her house without recollection of how she got there. She reviewed her Ring doorbell footage and saw that she had taken her dog out for a walk and lost consciousness as she was returning inside the house. She recalls feeling dizzy before the first episode but not the second. She had not eaten before either episode, but did not have the same symptoms prior to the event. She has a history of wearing a CGM monitor but has not used it recently due to concerns about being sent to the hospital for COVID.  No chest pain, dizziness, nausea, sweating, palpitations, pre-syncope, or other symptoms associated with the most recent episode. No weakness or slurred speech upon waking from the episodes.  She  reports a sprained ankle from the recent fall, which has been swollen for five days. She has been icing it and trying to keep it elevated.   She has a history of sleep apnea and restless leg syndrome. As a child, she was told she would stop breathing in her sleep. She has not used a CPAP machine due to cost in the past. It has been several years since her last sleep study.   Her dog has been waking her up at night, which she attributes to her coughing or possibly not breathing. She has awoken with things moved around from where she feels they should be and mentions recently having a dream about getting into her wallet and when she awoke she was not able to find her wallet where she expected it to be.   She reports cognitive concerns, including difficulty finding items and feeling overwhelmed by her living environment. 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 Her housing environment and clutter has caused her to withdrawal socially and is overwhelming for her. She reports she recognizes the need for assistance and has reached out to an organizational company for help getting things in order.   She is also concerned about her ability to care for her dog, Con. She does not feel she can safely walk him daily due to concerns with her syncopal episodes and her shortness of breath from COVID. She has considered short term foster placement while she continues to  recover.   She has been experiencing gastrointestinal issues, including alternating constipation and diarrhea, and is scheduled for a colonoscopy on October 5th for further evaluation. She follows a pescatarian diet and has been trying to increase her fluid intake.  All ROS negative with exception of what is listed above.   PHYSICAL EXAM Physical Exam Vitals and nursing note reviewed.  Constitutional:      Appearance: She is not ill-appearing or  diaphoretic.  HENT:     Head: Normocephalic and atraumatic.  Eyes:     Extraocular Movements: Extraocular movements intact.     Conjunctiva/sclera: Conjunctivae normal.     Pupils: Pupils are equal, round, and reactive to light.  Cardiovascular:     Rate and Rhythm: Normal rate and regular rhythm.     Pulses: Normal pulses.     Heart sounds: Normal heart sounds.  Pulmonary:     Effort: Pulmonary effort is normal.     Breath sounds: Wheezing present.  Abdominal:     General: Bowel sounds are normal.     Palpations: Abdomen is soft.  Musculoskeletal:        General: Swelling, tenderness and signs of injury present.     Cervical back: Neck supple.     Comments: Left ankle is swollen and tender on both the medial and lateral sides with light palpation. She has a soft brace on the ankle and is using her cane for ambulation. She is able to place pressure on the foot.   Skin:    General: Skin is warm and dry.     Capillary Refill: Capillary refill takes less than 2 seconds.  Neurological:     Mental Status: She is alert and oriented to person, place, and time.     Sensory: No sensory deficit.     Motor: Weakness present.     Coordination: Coordination normal.     Gait: Gait abnormal.  Psychiatric:        Attention and Perception: Attention normal.        Mood and Affect: Mood is anxious and depressed. Affect is labile and tearful.        Speech: Speech is rapid and pressured and tangential.        Behavior: Behavior is cooperative.        Cognition and Memory: Memory is impaired.     PLAN Problem List Items Addressed This Visit     Type 2 diabetes mellitus with neurological complications (HCC)   Type 2 diabetes is well controlled with current management. No significant changes in blood sugar levels, even with recent prednisone  use. - Continue current diabetes management plan. - Monitor blood sugar levels, especially during prednisone  treatment.       Relevant Orders    Hemoglobin A1c   CBC with Differential/Platelet   Lipid panel   Loss of consciousness (HCC) - Primary   Recurrent episodes of syncope with no clear etiology. Possible differential diagnoses include vasovagal syncope, low blood sugar, orthostatic hypotension, or neurological disorder. Previous episodes associated with dizziness and lack of food intake, but no dizziness with the most recent episode. No chest pain present. No evidence of cardiac causes from previous tests. No evidence of weakness or slurred speech following the event.  - Encourage monitoring of blood pressure and blood sugar levels. - Neurology evaluation for assistance with evaluation for possible causes.         Adjustment disorder with mixed anxiety and depressed mood   Reports of feeling overwhelmed and  loss of control, possibly exacerbated by current health issues and environmental stressors. Impact of social isolation and environmental factors on mental health discussed. - Encourage seeking therapy for emotional support. - Consider environmental changes to reduce stress, such as organizing living space or taking a short respite.      Acute left ankle pain   Right ankle sprain with swelling for five days following a fall. Swelling increases when the ankle brace is removed. Able to bear weight but with difficulty. - Order x-ray of the right ankle to rule out fracture. - Advise continued icing and elevation of the ankle.       Relevant Orders   DG Ankle Complete Left   Bronchitis   Prolonged bronchitis following COVID-19 infection with persistent cough and clear phlegm production. No signs of infection such as yellow or foamy mucus. Lungs clear with some inflammation/mild wheeze, no crackles or fevers at this time. We discussed that post viral bronchitis can last for many weeks, particularly as a person ages and their immune system wanes. At this time I do not feel there is a need for additional radiation with CXR, but we  can certainly consider this if her symptoms are not improving at the 6 week mark or she develops fevers, chills, or purulent mucous production. Given her co-morbidities, I do feel that prednisone  and doxycycline  are warranted to aid in recovery and reduction of inflammation.  - Prescribe prednisone  for inflammation. - Prescribe doxycycline  - Encourage rest and hydration. - Consider chest x-ray if symptoms persist.      Relevant Medications   predniSONE  (DELTASONE ) 20 MG tablet   doxycycline  (VIBRA -TABS) 100 MG tablet   History of sleep apnea   Never on therapy in the past. Reports awakening at night, emotional lability, increased daytime fatigue, and memory changes. This does make me question a potential correlation with sleep disorder that could be affecting her mental state due to non-restful sleep. Will send referral.       Relevant Orders   Ambulatory referral to Sleep Studies   History of sleep walking   Recent dreamed events showing up when awake leading to possible correlation with sleep walking. History of sleep walking as a child. Current emotional state is very overwhelmed, which does make me question the restfulness of her sleep. Recommend evaluation for possible sleep disorder.       Relevant Orders   Ambulatory referral to Sleep Studies    Return in about 2 months (around 09/25/2024) for Med Management 45.  SaraBeth Tokiko Diefenderfer, DNP, AGNP-c Time: 64 minutes, >50% spent counseling, care coordination, chart review, and documentation.  SABRAtime

## 2024-07-26 NOTE — Assessment & Plan Note (Signed)
 Prolonged bronchitis following COVID-19 infection with persistent cough and clear phlegm production. No signs of infection such as yellow or foamy mucus. Lungs clear with some inflammation/mild wheeze, no crackles or fevers at this time. We discussed that post viral bronchitis can last for many weeks, particularly as a person ages and their immune system wanes. At this time I do not feel there is a need for additional radiation with CXR, but we can certainly consider this if her symptoms are not improving at the 6 week mark or she develops fevers, chills, or purulent mucous production. Given her co-morbidities, I do feel that prednisone  and doxycycline  are warranted to aid in recovery and reduction of inflammation.  - Prescribe prednisone  for inflammation. - Prescribe doxycycline  - Encourage rest and hydration. - Consider chest x-ray if symptoms persist.

## 2024-07-26 NOTE — Assessment & Plan Note (Signed)
 Recent dreamed events showing up when awake leading to possible correlation with sleep walking. History of sleep walking as a child. Current emotional state is very overwhelmed, which does make me question the restfulness of her sleep. Recommend evaluation for possible sleep disorder.

## 2024-07-26 NOTE — Assessment & Plan Note (Addendum)
 Recurrent episodes of syncope with no clear etiology. Possible differential diagnoses include vasovagal syncope, low blood sugar, orthostatic hypotension, or neurological disorder. Previous episodes associated with dizziness and lack of food intake, but no dizziness with the most recent episode. No chest pain present. No evidence of cardiac causes from previous tests. No evidence of weakness or slurred speech following the event.  - Encourage monitoring of blood pressure and blood sugar levels. - Neurology evaluation for assistance with evaluation for possible causes.

## 2024-07-26 NOTE — Patient Instructions (Addendum)
 I have sent a referral to neurology for a sleep study. They may also be able to help rule out a sleep disorder that could be present causing some of the night time symptoms you have noticed.  Some of your symptoms sound a little bit like sleep walking/ sleep dysfunction.  I would like you to go to [address] at [facility] to have the x-ray of your left ankle to make sure that this is not a fracture.  I have sent in doxycycline  and prednisone  for your bronchitis.

## 2024-07-27 LAB — HEMOGLOBIN A1C
Est. average glucose Bld gHb Est-mCnc: 140 mg/dL
Hgb A1c MFr Bld: 6.5 % — ABNORMAL HIGH (ref 4.8–5.6)

## 2024-07-27 LAB — CBC WITH DIFFERENTIAL/PLATELET
Basophils Absolute: 0 x10E3/uL (ref 0.0–0.2)
Basos: 1 %
EOS (ABSOLUTE): 0.2 x10E3/uL (ref 0.0–0.4)
Eos: 7 %
Hematocrit: 38.8 % (ref 34.0–46.6)
Hemoglobin: 12.5 g/dL (ref 11.1–15.9)
Immature Grans (Abs): 0 x10E3/uL (ref 0.0–0.1)
Immature Granulocytes: 0 %
Lymphocytes Absolute: 1.5 x10E3/uL (ref 0.7–3.1)
Lymphs: 46 %
MCH: 26.7 pg (ref 26.6–33.0)
MCHC: 32.2 g/dL (ref 31.5–35.7)
MCV: 83 fL (ref 79–97)
Monocytes Absolute: 0.3 x10E3/uL (ref 0.1–0.9)
Monocytes: 9 %
Neutrophils Absolute: 1.2 x10E3/uL — ABNORMAL LOW (ref 1.4–7.0)
Neutrophils: 37 %
Platelets: 304 x10E3/uL (ref 150–450)
RBC: 4.68 x10E6/uL (ref 3.77–5.28)
RDW: 14.6 % (ref 11.7–15.4)
WBC: 3.3 x10E3/uL — ABNORMAL LOW (ref 3.4–10.8)

## 2024-07-27 LAB — LIPID PANEL
Cholesterol, Total: 78 mg/dL — AB (ref 100–199)
HDL: 43 mg/dL (ref >39)
LDL CALC COMMENT:: 1.8 ratio (ref 0.0–4.4)
LDL Chol Calc (NIH): 12 mg/dL (ref 0–99)
Triglycerides: 130 mg/dL (ref 0–149)
VLDL Cholesterol Cal: 23 mg/dL (ref 5–40)

## 2024-07-28 ENCOUNTER — Ambulatory Visit: Payer: Self-pay | Admitting: Nurse Practitioner

## 2024-07-29 ENCOUNTER — Ambulatory Visit (INDEPENDENT_AMBULATORY_CARE_PROVIDER_SITE_OTHER): Admitting: Licensed Clinical Social Worker

## 2024-07-29 DIAGNOSIS — F4323 Adjustment disorder with mixed anxiety and depressed mood: Secondary | ICD-10-CM
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
## 2024-08-02 ENCOUNTER — Telehealth: Payer: Self-pay

## 2024-08-02 NOTE — Telephone Encounter (Signed)
 Copied from CRM #[id number]. Topic: Clinical - Medical Advice >> Aug 02, 2024  3:53 PM Debby BROCKS wrote: Reason for CRM: Patient would like to speak to a nurse regarding the letter she received of her illness. Due to that letter she had to cancel future plans and wants more information

## 2024-08-05 ENCOUNTER — Telehealth: Payer: Self-pay

## 2024-08-05 NOTE — Telephone Encounter (Signed)
 Pt. Aware of x-ray results.    Copied from CRM #[id number]. Topic: General - Other >> Aug 04, 2024 12:29 PM Kevelyn M wrote: Reason for CRM: Patient calling Reyes Fifield back. Call back # [phone]

## 2024-08-09 ENCOUNTER — Other Ambulatory Visit: Payer: Self-pay

## 2024-08-09 ENCOUNTER — Ambulatory Visit: Admitting: Family Medicine

## 2024-08-09 ENCOUNTER — Ambulatory Visit: Payer: Self-pay | Admitting: Family Medicine

## 2024-08-09 ENCOUNTER — Ambulatory Visit
Admission: RE | Admit: 2024-08-09 | Discharge: 2024-08-09 | Disposition: A | Discharge status: Home / Self Care | Source: Ambulatory Visit | Attending: Family Medicine | Admitting: Family Medicine

## 2024-08-09 ENCOUNTER — Encounter (HOSPITAL_COMMUNITY): Payer: Self-pay

## 2024-08-09 ENCOUNTER — Encounter: Payer: Self-pay | Admitting: Family Medicine

## 2024-08-09 ENCOUNTER — Other Ambulatory Visit (HOSPITAL_COMMUNITY): Payer: Self-pay

## 2024-08-09 VITALS — BP 122/74 | HR 99 | Wt 149.0 lb

## 2024-08-09 DIAGNOSIS — S99922A Unspecified injury of left foot, initial encounter: Secondary | ICD-10-CM

## 2024-08-09 DIAGNOSIS — S99912A Unspecified injury of left ankle, initial encounter: Secondary | ICD-10-CM | POA: Diagnosis not present

## 2024-08-09 MED ORDER — MELOXICAM 15 MG PO TABS
15.0000 mg | ORAL_TABLET | Freq: Every day | ORAL | 0 refills | Status: DC | PRN
  Filled 2024-08-09: qty 30, 30d supply, fill #0

## 2024-08-09 NOTE — Progress Notes (Signed)
 Name: Pam [name]   Date of Visit: 08/09/24   Date of last visit with me: Visit date not found   CHIEF COMPLAINT:  Chief Complaint  Patient presents with   Acute Visit    Ankle pain. Happened 3 weeks ago, swelling has came down. Whole bottom of her foot was bruised. Pain will not go away.        HPI:  Discussed the use of AI scribe software for clinical note transcription with the patient, who gave verbal consent to proceed.  History of Present Illness   Pam [name] Pam [name] or [name] is a 73 year old female with diabetes who presents with persistent foot pain and bruising following a fall.  Approximately three weeks ago, she experienced a fall during a bout of COVID-19 and bronchitis, resulting in a brief loss of consciousness for about ten seconds. This incident mirrors a previous fall from a year and a half ago. She does not recall tripping or falling and was not engaged in any activity like cooking at the time.  Following the fall, she developed significant bruising on her foot, which appeared about a week or two later. The bruising is extensive, covering the bottom, side, and top of her foot, and remains tender. The pain worsens throughout the day, becoming unbearable by nighttime. She uses a hiking coat for support while walking her dog, as she has been unable to hire a dog walker.  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 She has undergone previous scans and a memory test with a neuropsychologist, which did not reveal any significant abnormalities.  Current medications include aspirin  81 mg daily. She has not been taking any other anti-inflammatory medications.         OBJECTIVE:       06/15/2024    1:35 PM  Depression screen PHQ 2/9  Decreased Interest 0  Down, Depressed, Hopeless 0  PHQ - 2 Score 0     BP Readings from Last 3 Encounters:  08/09/24 122/74  07/26/24 130/82  07/05/24 (!) 147/74    BP 122/74    Pulse 99   Wt 149 lb (67.6 kg)   SpO2 98%   BMI 28.15 kg/m    Physical Exam   EXTREMITIES: Bruising and tenderness on the foot, particularly on the top and near the toes.      Imaging: Previous x-ray of the ankle from 07/26/2024 independently reviewed by me.  No significant degenerative changes of the ankle joint.  Mild spurring of the navicular.  No signs of any fractures.  Physical Exam  Inspection reveals some mild swelling of the plantar and dorsal side of the foot.  There is some mild bruising on the medial aspect of the midfoot.  There is tenderness over the Lisfranc area both dorsally and plantar side.  Pain with ambulation.  Full range of motion of the ankle joint and normal inversion and eversion testing. ASSESSMENT/PLAN:   Assessment & Plan Injury of left foot, initial encounter    Assessment and Plan    Right foot pain and contusion Persistent pain and bruising plantar side post-fall with possible concern for Lisfranc injury. Previous ankle x-ray unremarkable, further imaging required. - Order foot x-ray at [facility]. - Fit with walking boot for immobilization. - Prescribe anti-inflammatory medication daily with food for one week, then as needed. - Instruct to ice foot three times daily for 20 minutes. - Advise against hot water on  foot. - Consider knee scooter if x-ray indicates severe injury. - Schedule follow-up in three weeks. - Send prescription to [facility].   Type 2 diabetes mellitus -No signs of any open wound injury at this time.  No concern for diabetic infection.        Xayla Puzio A. Vita MD [facility]

## 2024-08-10 ENCOUNTER — Other Ambulatory Visit (HOSPITAL_COMMUNITY): Payer: Self-pay

## 2024-08-11 ENCOUNTER — Telehealth: Payer: Self-pay

## 2024-08-11 NOTE — Telephone Encounter (Signed)
 We received a new referral for neurology and for sleep from this patients PCP. She is a patient of Dr Buck for both neurology and sleep. She is asking to transfer her care for both neurology and sleep to Dr Chalice.  Please advise if this is acceptable

## 2024-08-11 NOTE — Telephone Encounter (Signed)
 Ok with me

## 2024-08-12 ENCOUNTER — Other Ambulatory Visit (HOSPITAL_COMMUNITY): Payer: Self-pay

## 2024-08-12 ENCOUNTER — Ambulatory Visit: Admitting: Family Medicine

## 2024-08-13 ENCOUNTER — Ambulatory Visit: Admitting: Licensed Clinical Social Worker

## 2024-08-13 DIAGNOSIS — F4323 Adjustment disorder with mixed anxiety and depressed mood: Secondary | ICD-10-CM

## 2024-08-13 NOTE — Progress Notes (Signed)
 [facility] Counselor/Therapist Progress Note  Patient ID: Pam [name], MRN: [id number]    Date: 08/13/24  Time Spent: 0900  am - 1006 am : 66 Minutes  Treatment Type: Individual Therapy.  Reported Symptoms: Patient reports that she has attended therapy since age 73 any time there is a life change or crisis. She reports that she feels that retirement has been a major life change.    Mental Status Exam: Appearance:  Neat     Behavior: Appropriate  Motor: Normal  Speech/Language:  Clear and Coherent  Affect: Appropriate  Mood: normal  Thought process: normal  Thought content:   WNL  Sensory/Perceptual disturbances:   WNL  Orientation: oriented to person, place, time/date, situation, day of week, month of year, and year  Attention: Good  Concentration: Good  Memory: WNL  Fund of knowledge:  Good  Insight:   Good  Judgment:  Good  Impulse Control: Good    Risk Assessment: Danger to Self:  No Self-injurious Behavior: No Danger to Others: No Duty to Warn:no Physical Aggression / Violence:No  Access to Firearms a concern: No  Gang Involvement:No    Subjective:    Microbiologist participated from home, via video Patient is aware of risk and limitations, and consented to treatment. Therapist participated from office located at [facility]. We met online due to patient request.   Payslee presented for her session in a positive mood. She reports that she has continued to struggle with the effects of her Covid diagnosis. She reports that this is the eighth week of being ill. Patient reports that she went to the neurologist about her passing out and they appeared to dismiss her. She reports that she was asking questions regarding her treatment and the doctor seemed to be agitated at her. Alyscia states she was greatly offended and feels that care for the elderly is often not taken seriously. Patient reports that this stage of life is not what she was prepared for. She  reports a desire to travel and do things she enjoys but finds that due to her health and state of the world she finds she doesn't do much. She states she has very little in common with her family and tends to enjoys museums and shows versus their interest. Ronique identified that the aging process is not something to look forward to.   Clinician actively listened and processed with patient her concerns. Clinician and patient discussed all stages of life can be an adjustment but the aging process and health issues can often be overwhelming. Clinician processed with patient the importance of mindfulness and how it can help improve ones outlook and frame of mind. Clinician identified: Mindfulness, a practice that involves paying attention to the present moment without judgment, offers numerous benefits for both mental and physical health. Some of the key benefits include:  Mental Health:  Reduces stress and anxiety Improves mood and emotional regulation Decreases symptoms of depression and PTSD Enhances cognitive function, including attention and concentration Physical Health: Lowers blood pressure, Reduces chronic pain, Improves sleep quality, Boosts the immune system, and May reduce the risk of heart disease and stroke.  Other Benefits: Increases self-awareness and insight, Improves relationships and interpersonal skills, Enhances decision-making abilities, Promotes a sense of calm and groundedness, and Reduces rumination and negative thoughts.   Lashana was pleasant and cooperative during her session and identified her concern over her current health and struggle to get better and be herself again. Amarilis states she will remain as  active as possible and keep contact with students and those she enjoys the same activities with. Neelah reports that she will increase use of mindfulness. Patient is to use CBT, mindfulness and coping skills to help manage decrease symptoms associated with their diagnosis.  Treatment planning to be reviewed by 06/17/2025.   Interventions: Cognitive Behavioral Therapy, Dialectical Behavioral Therapy, Assertiveness/Communication, Mindfulness Meditation, and Solution-Oriented/Positive Psychology  Diagnosis: Adjustment Disorder with mixed Anxiety and Depression    Damien Junk MSW, LCSW/DATE 08/13/2024

## 2024-08-16 ENCOUNTER — Ambulatory Visit: Admitting: Internal Medicine

## 2024-08-23 ENCOUNTER — Other Ambulatory Visit (HOSPITAL_COMMUNITY): Payer: Self-pay

## 2024-08-25 ENCOUNTER — Other Ambulatory Visit (HOSPITAL_COMMUNITY): Payer: Self-pay

## 2024-08-29 NOTE — Progress Notes (Unsigned)
 No chief complaint on file.      PMH, PSH, SH reviewed   ROS:    PHYSICAL EXAM:  There were no vitals taken for this visit.      ASSESSMENT/PLAN:

## 2024-08-30 ENCOUNTER — Ambulatory Visit: Payer: Self-pay | Admitting: Family Medicine

## 2024-08-30 ENCOUNTER — Ambulatory Visit: Admitting: Family Medicine

## 2024-08-30 ENCOUNTER — Encounter: Payer: Self-pay | Admitting: Family Medicine

## 2024-08-30 ENCOUNTER — Other Ambulatory Visit: Payer: Self-pay

## 2024-08-30 VITALS — BP 102/68 | HR 68 | Temp 97.6°F | Ht 61.0 in | Wt 152.4 lb

## 2024-08-30 DIAGNOSIS — Z23 Encounter for immunization: Secondary | ICD-10-CM

## 2024-08-30 DIAGNOSIS — R300 Dysuria: Secondary | ICD-10-CM

## 2024-08-30 DIAGNOSIS — R5383 Other fatigue: Secondary | ICD-10-CM | POA: Diagnosis not present

## 2024-08-30 DIAGNOSIS — J4540 Moderate persistent asthma, uncomplicated: Secondary | ICD-10-CM

## 2024-08-30 DIAGNOSIS — R0989 Other specified symptoms and signs involving the circulatory and respiratory systems: Secondary | ICD-10-CM | POA: Diagnosis not present

## 2024-08-30 DIAGNOSIS — J302 Other seasonal allergic rhinitis: Secondary | ICD-10-CM | POA: Diagnosis not present

## 2024-08-30 DIAGNOSIS — R052 Subacute cough: Secondary | ICD-10-CM | POA: Diagnosis not present

## 2024-08-30 LAB — POCT URINALYSIS DIP (PROADVANTAGE DEVICE)
Bilirubin, UA: NEGATIVE
Glucose, UA: NEGATIVE mg/dL
Ketones, POC UA: NEGATIVE mg/dL
Leukocytes, UA: NEGATIVE
Nitrite, UA: NEGATIVE
Protein Ur, POC: NEGATIVE mg/dL
Specific Gravity, Urine: 1.015
Urobilinogen, Ur: 0.2
pH, UA: 7 (ref 5.0–8.0)

## 2024-08-30 LAB — POCT INFLUENZA A/B
Influenza A, POC: NEGATIVE
Influenza B, POC: NEGATIVE

## 2024-08-30 LAB — POC COVID19 BINAXNOW: SARS Coronavirus 2 Ag: NEGATIVE

## 2024-08-30 MED ORDER — PREDNISONE 10 MG (21) PO TBPK
ORAL_TABLET | ORAL | 0 refills | Status: DC
  Filled 2024-08-30: qty 21, 6d supply, fill #0

## 2024-08-30 NOTE — Patient Instructions (Signed)
 Stay well hydrated. You can continue the coricidin as needed. In addition, since I don't believe Coricidin contains guaifenesin  (double check the ingredient list), you can take mucinex  (plain 600 mg) 12 hour tablets twice daily as needed for any thick mucus and cough.  Your wheezing might be worse recently due to being out of the Symbicort , as well as flaring from postnasal drainage from allergies. Please pick up a refill from the pharmacy, and resume using 2 puffs twice daily, and remembering to rinse your mouth after using it.  Based on the nasal exam, your fall allergies are flaring. I encourage you to use Flonase  DAILY--2 gentle sniffs into each nostril, once daily.  We are treating you with a course of prednisone --to treat your allergies and asthma. This will cause your sugars to got up significantly, but just while you are taking the prednisone .  You can get a COVID booster in late November/early December (3 months from your COVID infection).  You may want to check your sugar with a fingerstick (or move your arm around and see if the sugar changes) when you get a low reading when sleeping, if you are laying on the arm with your Freestyle sensor. We sometimes see low readings due to pressure on the sensor. No need to over-treat with sugar tablets if the low reading isn't real.

## 2024-08-31 ENCOUNTER — Encounter: Payer: Self-pay | Admitting: Neurology

## 2024-08-31 ENCOUNTER — Ambulatory Visit (INDEPENDENT_AMBULATORY_CARE_PROVIDER_SITE_OTHER): Admitting: Neurology

## 2024-08-31 VITALS — BP 111/54 | HR 85 | Ht 61.0 in | Wt 151.0 lb

## 2024-08-31 DIAGNOSIS — R519 Headache, unspecified: Secondary | ICD-10-CM | POA: Diagnosis not present

## 2024-08-31 DIAGNOSIS — G4719 Other hypersomnia: Secondary | ICD-10-CM

## 2024-08-31 DIAGNOSIS — R351 Nocturia: Secondary | ICD-10-CM | POA: Diagnosis not present

## 2024-08-31 DIAGNOSIS — Z8669 Personal history of other diseases of the nervous system and sense organs: Secondary | ICD-10-CM

## 2024-08-31 NOTE — Progress Notes (Signed)
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 evaluated her for dizziness in the past and also last year.  She has seen ENT and she is now scheduled to see cardiology next week.  We have previously evaluated her with a brain MRI and most recently she had a brain and internal auditory canal MRI with and without contrast in October last year with benign and stable findings noted.  We will keep her posted as to her test results by phone call and plan of follow-up accordingly.  We talked about the importance of maintaining good sleep hygiene during this visit as well.  She is strongly advised to seek immediate medical attention or call 911 if she were to have a fall or passing out spell.    We will try to schedule her sleep study after she has been able to come off the boot although she does not sleep with the boot in place.   I answered all her questions today and she was in agreement with our plan.  Thank you very much for allowing me to participate in the care of this nice patient. If I can be of any further assistance to you  please do not hesitate to call me at [phone].  Sincerely,   True Mar, MD, PhD I spent 45 minutes in total face-to-face time and in reviewing records during pre-charting, more than 50% of which was spent in counseling and coordination of care, reviewing test results, reviewing medications and treatment regimen and/or in discussing or reviewing the diagnosis of OSA, the prognosis and treatment options. Pertinent laboratory and imaging test results that were available during this visit with the patient were reviewed by me and considered in my medical decision making (see chart for details).

## 2024-09-03 ENCOUNTER — Telehealth: Payer: Self-pay | Admitting: Neurology

## 2024-09-03 ENCOUNTER — Ambulatory Visit: Admitting: Licensed Clinical Social Worker

## 2024-09-03 DIAGNOSIS — F4323 Adjustment disorder with mixed anxiety and depressed mood: Secondary | ICD-10-CM

## 2024-09-03 NOTE — Progress Notes (Signed)
 [facility] Counselor/Therapist Progress Note  Patient ID: Pam [name], MRN: [id number]    Date: 09/03/24  Time Spent: 1007  am - 1100 am : 53 Minutes  Treatment Type: Individual Therapy.  Reported Symptoms: Patient reports that she has attended therapy since age 73 any time there is a life change or crisis. She reports that she feels that retirement has been a major life change.    Mental Status Exam: Appearance:  Neat     Behavior: Appropriate  Motor: Normal  Speech/Language:  Clear and Coherent  Affect: Appropriate  Mood: normal  Thought process: normal  Thought content:   WNL  Sensory/Perceptual disturbances:   WNL  Orientation: oriented to person, place, time/date, situation, day of week, month of year, and year  Attention: Good  Concentration: Good  Memory: WNL  Fund of knowledge:  Good  Insight:   Good  Judgment:  Good  Impulse Control: Good    Risk Assessment: Danger to Self:  No Self-injurious Behavior: No Danger to Others: No Duty to Warn:no Physical Aggression / Violence:No  Access to Firearms a concern: No  Gang Involvement:No    Subjective:    Microbiologist participated from home, via video Patient is aware of risk and limitations, and consented to treatment. Therapist participated from office located at [facility]. We met online due to patient request.     Pam [name] was pleasant and cooperative during her session and continued to identify her concern over her current health and struggle to get better and be herself again. Pam [name] states that she is currently in a boot due to bruising. She reports that she has passed out again. She reports that she is fearful of the what if's and states that after speaking with the doctor she needs a sleep study. She reports that she has decided to not to go back to the doctor she had previously been seeing and will go to [facility] in the future. Pam [name] reports that she will increase use of mindfulness. Patient  is to use CBT, mindfulness and coping skills to help manage decrease symptoms associated with their diagnosis. Treatment planning to be reviewed by 06/17/2025.   Clinician actively listened and processed with patient her concerns. Clinician and patient discussed all stages of life can be an adjustment but the aging process and health issues can often be overwhelming. Clinician processed with patient the importance of mindfulness and how it can help improve ones outlook and frame of mind. Clinician identified: Mindfulness, a practice that involves paying attention to the present moment without judgment, offers numerous benefits for both mental and physical health. Some of the key benefits include:  Mental Health:  Reduces stress and anxiety Improves mood and emotional regulation Decreases symptoms of depression and PTSD Enhances cognitive function, including attention and concentration Physical Health: Lowers blood pressure, Reduces chronic pain, Improves sleep quality, Boosts the immune system, and May reduce the risk of heart disease and stroke.  Other Benefits: Increases self-awareness and insight, Improves relationships and interpersonal skills, Enhances decision-making abilities, Promotes a sense of calm and groundedness, and Reduces rumination and negative thoughts.    Pam [name] was pleasant and cooperative during her session and identified her concern over her current health and struggle to get better and be herself again. Pam [name] states she will remain as active as possible and keep contact with students and those she enjoys the same activities with. Pam [name] reports that she will increase use of mindfulness. Patient is to use CBT, mindfulness and coping  skills to help manage decrease symptoms associated with their diagnosis. Treatment planning to be reviewed by 06/17/2025.   Interventions: Cognitive Behavioral Therapy, Dialectical Behavioral Therapy, Assertiveness/Communication, Mindfulness Meditation,  and Solution-Oriented/Positive Psychology   Diagnosis: Adjustment Disorder with mixed Anxiety and Depression       Damien Junk MSW, LCSW/DATE 09/03/2024

## 2024-09-03 NOTE — Telephone Encounter (Signed)
 NPSG [employer] pending
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
## 2024-09-06 ENCOUNTER — Other Ambulatory Visit (HOSPITAL_BASED_OUTPATIENT_CLINIC_OR_DEPARTMENT_OTHER): Payer: Self-pay

## 2024-09-06 ENCOUNTER — Telehealth: Payer: Self-pay | Admitting: Cardiovascular Disease

## 2024-09-06 ENCOUNTER — Ambulatory Visit (HOSPITAL_BASED_OUTPATIENT_CLINIC_OR_DEPARTMENT_OTHER): Admitting: Emergency Medicine

## 2024-09-06 ENCOUNTER — Encounter (HOSPITAL_BASED_OUTPATIENT_CLINIC_OR_DEPARTMENT_OTHER): Payer: Self-pay | Admitting: Emergency Medicine

## 2024-09-06 ENCOUNTER — Other Ambulatory Visit: Payer: Self-pay

## 2024-09-06 VITALS — BP 144/80 | HR 58 | Ht 61.0 in | Wt 152.4 lb

## 2024-09-06 DIAGNOSIS — R0609 Other forms of dyspnea: Secondary | ICD-10-CM

## 2024-09-06 DIAGNOSIS — E785 Hyperlipidemia, unspecified: Secondary | ICD-10-CM | POA: Diagnosis not present

## 2024-09-06 DIAGNOSIS — R55 Syncope and collapse: Secondary | ICD-10-CM

## 2024-09-06 DIAGNOSIS — R072 Precordial pain: Secondary | ICD-10-CM | POA: Diagnosis not present

## 2024-09-06 DIAGNOSIS — I2510 Atherosclerotic heart disease of native coronary artery without angina pectoris: Secondary | ICD-10-CM

## 2024-09-06 DIAGNOSIS — I10 Essential (primary) hypertension: Secondary | ICD-10-CM

## 2024-09-06 MED ORDER — NITROGLYCERIN 0.4 MG SL SUBL
0.4000 mg | SUBLINGUAL_TABLET | SUBLINGUAL | 3 refills | Status: DC | PRN
  Filled 2024-09-06: qty 25, 1d supply, fill #0

## 2024-09-06 MED ORDER — NITROGLYCERIN 0.4 MG SL SUBL
0.4000 mg | SUBLINGUAL_TABLET | SUBLINGUAL | 3 refills | Status: AC | PRN
  Filled 2024-09-06: qty 25, 5d supply, fill #0

## 2024-09-06 MED ORDER — DIPHENHYDRAMINE HCL 50 MG PO CAPS
ORAL_CAPSULE | ORAL | 0 refills | Status: DC
  Filled 2024-09-06 – 2024-10-20 (×2): qty 1, 1d supply, fill #0

## 2024-09-06 MED ORDER — PREDNISONE 50 MG PO TABS
ORAL_TABLET | ORAL | 0 refills | Status: DC
  Filled 2024-09-06: qty 3, 1d supply, fill #0

## 2024-09-06 NOTE — Telephone Encounter (Signed)
 Given it has been 6 mos since her last visit would advise in person visit.   Ixchel Duck S Sabryn Preslar, NP

## 2024-09-06 NOTE — Telephone Encounter (Signed)
 NPSG Mylene barrows: [id number] (exp. 09/03/24 to 11/10/24)

## 2024-09-06 NOTE — Telephone Encounter (Signed)
 Pt would like to know if her 11:20 appt for today could be done virtually/phone. Please advise.   Pt states she has monitors she can do her vitals with if virtual appt is an option.

## 2024-09-06 NOTE — Patient Instructions (Addendum)
 Medication Instructions:   Continue your current medications.   *If you need a refill on your cardiac medications before your next appointment, please call your pharmacy*  Lab Work: Your physician recommends that you return for lab work today: BMET  If you have labs (blood work) drawn today and your tests are completely normal, you will receive your results only by: MyChart Message (if you have MyChart) OR A paper copy in the mail If you have any lab test that is abnormal or we need to change your treatment, we will call you to review the results.  Testing/Procedures: Your physician has requested that you have an echocardiogram. Echocardiography is a painless test that uses sound waves to create images of your heart. It provides your doctor with information about the size and shape of your heart and how well your heart's chambers and valves are working. This procedure takes approximately one hour. There are no restrictions for this procedure. Please do NOT wear cologne, perfume, aftershave, or lotions (deodorant is allowed). Please arrive 15 minutes prior to your appointment time.  Please note: We ask at that you not bring children with you during ultrasound (echo/ vascular) testing. Due to room size and safety concerns, children are not allowed in the ultrasound rooms during exams. Our front office staff cannot provide observation of children in our lobby area while testing is being conducted. An adult accompanying a patient to their appointment will only be allowed in the ultrasound room at the discretion of the ultrasound technician under special circumstances. We apologize for any inconvenience.   Your physician has recommended that you wear a Zio AT Live monitor.   This monitor is a medical device that records the heart's electrical activity. Doctors most often use these monitors to diagnose arrhythmias. Arrhythmias are problems with the speed or rhythm of the heartbeat. The monitor is a  small device applied to your chest. You can wear one while you do your normal daily activities. While wearing this monitor if you have any symptoms to push the button and record what you felt. Once you have worn this monitor for the period of time provider prescribed (Usually 14 days), you will return the monitor device in the postage paid box/bag. Once it is returned they will download the data collected and provide us  with a report which the provider will then review and we will call you with those results.   Important tips:  Avoid showering during the first 24 hours of wearing the monitor. Avoid excessive sweating to help maximize wear time. Do not submerge the device, no hot tubs, and no swimming pools. Keep any lotions or oils away from the patch. After 24 hours you may shower with the patch on. Take brief showers with your back facing the shower head.  Do not remove patch once it has been placed because that will interrupt data and decrease adhesive wear time. Push the button when you have any symptoms and write down what you were feeling. Once you have completed wearing your monitor, remove and place into box which has postage paid and place in your outgoing mailbox.  If for some reason you have misplaced your box then call our office and we can provide another box and/or mail it off for you. Keep the transmitter within 10 feet at all times.  Expect a welcome phone call within 24-48 hrs of application from Zio.  This call will include your copay information, so please answer any unknown phone calls while wearing  Zio (it could also be important information about your heart) The envelope to return Zio is in the back of the transmitter. Removal instructions are on the last page of the symptom diary.  Place the patch sticky side up inside the transmitter and the symptom diary inside the envelope to return on your last wear day inside your mailbox or any USPS mailbox.  Your provider has  recommended a Cardiac CTA.---SEE INSTRUCTIONS BELOW TO REFER TO WHEN TEST IS SCHEDULED  Follow-Up: At [facility], you and your health needs are our priority.  As part of our continuing mission to provide you with exceptional heart care, our providers are all part of one team.  This team includes your primary Cardiologist (physician) and Advanced Practice Providers or APPs (Physician Assistants and Nurse Practitioners) who all work together to provide you with the care you need, when you need it.  Your next appointment:   2-3 month(s)  Provider:   Annabella Scarce, MD, Rosaline Bane, NP, or [name], NP     Other Instructions    Your cardiac CT will be scheduled at one of the below locations:    [facility] [address]  [place], [place] [address]     If scheduled at the [facility] at [address], please enter the parking lot using the [address] entrance and use the FREE valet service at the patient drop-off area. Enter the building and check-in with registration on the main floor.   Please follow these instructions carefully (unless otherwise directed):  An IV will be required for this test and Nitroglycerin  will be given.  Hold all erectile dysfunction medications at least 3 days (72 hrs) prior to test. (Ie viagra, cialis, sildenafil, tadalafil, etc)   On the Night Before the Test: Be sure to Drink plenty of water. Do not consume any caffeinated/decaffeinated beverages or chocolate 12 hours prior to your test. Do not take any antihistamines 12 hours prior to your test.  DO NOT TAKE levocetirizine (XYZAL ) 12 HOURS PRIOR TO THIS TEST  If the patient has contrast allergy: Patient will need a prescription for Prednisone  and very clear instructions (as follows): Prednisone  50 mg - take 13 hours prior to test Take another Prednisone  50 mg 7 hours prior to test Take another Prednisone  50 mg 1 hour prior to  test Take Benadryl  50 mg 1 hour prior to test Patient must complete all four doses of above prophylactic medications. Patient will need a ride after test due to Benadryl .  On the Day of the Test: Drink plenty of water until 1 hour prior to the test. Do not eat any food 1 hour prior to test. You may take your regular medications prior to the test.    Patients who wear a continuous glucose monitor MUST remove the device prior to scanning. FEMALES- please wear underwire-free bra if available, avoid dresses & tight clothing       After the Test: Drink plenty of water. After receiving IV contrast, you may experience a mild flushed feeling. This is normal. On occasion, you may experience a mild rash up to 24 hours after the test. This is not dangerous. If this occurs, you can take Benadryl  25 mg, Zyrtec, Claritin , or Allegra and increase your fluid intake. (Patients taking Tikosyn should avoid Benadryl , and may take Zyrtec, Claritin , or Allegra) If you experience trouble breathing, this can be serious. If it is severe call 911 IMMEDIATELY. If it is  mild, please call our office.  We will call to schedule your test 2-4 weeks out understanding that some insurance companies will need an authorization prior to the service being performed.   For more information and frequently asked questions, please visit our website : [email or web address]  For non-scheduling related questions, please contact the cardiac imaging nurse navigator should you have any questions/concerns: Cardiac Imaging Nurse Navigators Direct Office Dial: [phone]   For scheduling needs, including cancellations and rescheduling, please call Brittany, [phone].
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
## 2024-09-06 NOTE — Telephone Encounter (Addendum)
 Spoke with patient regarding appointment, unfortunately it was after time patient was to be here for visit Explained to patient she needed to be an in person  She has had 2 episodes of syncope and one near syncope over the last 2 months  Wears glucose monitor and BS were normal during these times Rescheduled visit for this afternoon, aware of time and location

## 2024-09-07 ENCOUNTER — Other Ambulatory Visit (HOSPITAL_COMMUNITY): Payer: Self-pay

## 2024-09-07 ENCOUNTER — Ambulatory Visit: Attending: Emergency Medicine

## 2024-09-07 ENCOUNTER — Other Ambulatory Visit: Payer: Self-pay

## 2024-09-07 DIAGNOSIS — R072 Precordial pain: Secondary | ICD-10-CM

## 2024-09-07 DIAGNOSIS — R55 Syncope and collapse: Secondary | ICD-10-CM

## 2024-09-07 DIAGNOSIS — R0609 Other forms of dyspnea: Secondary | ICD-10-CM

## 2024-09-07 NOTE — Progress Notes (Unsigned)
 Enrolled for [employer] to mail a ZIO AT Live Telemetry monitor to patients address on file.   Dr. Duke Salvia to read.

## 2024-09-08 ENCOUNTER — Other Ambulatory Visit: Payer: Self-pay

## 2024-09-09 ENCOUNTER — Other Ambulatory Visit: Payer: Self-pay

## 2024-09-09 DIAGNOSIS — R55 Syncope and collapse: Secondary | ICD-10-CM | POA: Diagnosis not present

## 2024-09-09 DIAGNOSIS — I2510 Atherosclerotic heart disease of native coronary artery without angina pectoris: Secondary | ICD-10-CM | POA: Diagnosis not present

## 2024-09-09 DIAGNOSIS — R0609 Other forms of dyspnea: Secondary | ICD-10-CM | POA: Diagnosis not present

## 2024-09-09 DIAGNOSIS — E785 Hyperlipidemia, unspecified: Secondary | ICD-10-CM | POA: Diagnosis not present

## 2024-09-09 DIAGNOSIS — R072 Precordial pain: Secondary | ICD-10-CM | POA: Diagnosis not present

## 2024-09-10 ENCOUNTER — Ambulatory Visit (HOSPITAL_BASED_OUTPATIENT_CLINIC_OR_DEPARTMENT_OTHER): Payer: Self-pay | Admitting: Family

## 2024-09-10 LAB — BASIC METABOLIC PANEL WITH GFR
BUN/Creatinine Ratio: 18 (ref 12–28)
BUN: 13 mg/dL (ref 8–27)
CO2: 27 mmol/L (ref 20–29)
Calcium: 9.6 mg/dL (ref 8.7–10.3)
Chloride: 103 mmol/L (ref 96–106)
Creatinine, Ser: 0.71 mg/dL (ref 0.57–1.00)
Glucose: 102 mg/dL — ABNORMAL HIGH (ref 70–99)
Potassium: 4.3 mmol/L (ref 3.5–5.2)
Sodium: 141 mmol/L (ref 134–144)
eGFR: 89 mL/min/1.73 (ref >59)

## 2024-09-14 ENCOUNTER — Other Ambulatory Visit: Payer: Self-pay

## 2024-09-15 ENCOUNTER — Ambulatory Visit (INDEPENDENT_AMBULATORY_CARE_PROVIDER_SITE_OTHER): Admitting: Licensed Clinical Social Worker

## 2024-09-15 DIAGNOSIS — F4323 Adjustment disorder with mixed anxiety and depressed mood: Secondary | ICD-10-CM

## 2024-09-15 NOTE — Progress Notes (Signed)
 [facility] Counselor/Therapist Progress Note  Patient ID: Pam [name], MRN: [id number]    Date: 09/15/24  Time Spent: 1006  am - 1102 am : 56 Minutes  Treatment Type: Individual Therapy.  Reported Symptoms: Patient reports that she has attended therapy since age 73 any time there is a life change or crisis. She reports that she feels that retirement has been a major life change.    Mental Status Exam: Appearance:  Neat     Behavior: Appropriate  Motor: Normal  Speech/Language:  Clear and Coherent  Affect: Appropriate  Mood: normal  Thought process: normal  Thought content:   WNL  Sensory/Perceptual disturbances:   WNL  Orientation: oriented to person, place, time/date, situation, day of week, month of year, and year  Attention: Good  Concentration: Good  Memory: WNL  Fund of knowledge:  Good  Insight:   Good  Judgment:  Good  Impulse Control: Good    Risk Assessment: Danger to Self:  No Self-injurious Behavior: No Danger to Others: No Duty to Warn:no Physical Aggression / Violence:No  Access to Firearms a concern: No  Gang Involvement:No    Subjective:    Microbiologist participated from home, via video Patient is aware of risk and limitations, and consented to treatment. Therapist participated from office located at [facility]. We met online due to patient request.  Mckynlie presented for her session stating that her dog walker didn't show up. She states that she was fine yesterday so she hopes she is okay. Patient states that she is having to be on a liquid diet due to some upcoming test. She reports that she is bleeding from her bowels. She is having herself checked. She reports that she ask a friend who had colon cancer because she is having the same symptoms. Patient voiced concerns but remained positive. Patient reports that she fell again since we last spoke. Patient also discussed her having hired a solicitor.   Clinician  actively listened and provided support via verbal feedback. Clinician encouraged patient to advocate for herself. Clinician and patient processed her health concerns and the importance of maintaining her wellness. Clinician and patient also discussed the benefits of an organized space and how it can improve our mental wellness.   Krisann was pleasant and cooperative during her session and identified her concern over her current health and struggle to get better and be herself again. Hadley states she will remain as active as possible and keep contact with students and those she enjoys the same activities with. Brenlynn reports that she will increase use of mindfulness. Patient is to use CBT, mindfulness and coping skills to help manage decrease symptoms associated with their diagnosis. Treatment planning to be reviewed by 06/17/2025.    Interventions: Cognitive Behavioral Therapy, Dialectical Behavioral Therapy, Assertiveness/Communication, Mindfulness Meditation, and Solution-Oriented/Positive Psychology   Diagnosis: Adjustment Disorder with mixed Anxiety and Depression Interventions:   Damien Junk MSW, LCSW/DATE 09/15/2024

## 2024-09-16 ENCOUNTER — Ambulatory Visit (HOSPITAL_COMMUNITY): Admission: RE | Admit: 2024-09-16 | Source: Ambulatory Visit

## 2024-09-17 DIAGNOSIS — K921 Melena: Secondary | ICD-10-CM | POA: Diagnosis not present

## 2024-09-17 DIAGNOSIS — K5730 Diverticulosis of large intestine without perforation or abscess without bleeding: Secondary | ICD-10-CM | POA: Diagnosis not present

## 2024-09-17 DIAGNOSIS — K648 Other hemorrhoids: Secondary | ICD-10-CM | POA: Diagnosis not present

## 2024-09-17 DIAGNOSIS — Z1211 Encounter for screening for malignant neoplasm of colon: Secondary | ICD-10-CM | POA: Diagnosis not present

## 2024-09-17 DIAGNOSIS — Z7985 Long-term (current) use of injectable non-insulin antidiabetic drugs: Secondary | ICD-10-CM | POA: Diagnosis not present

## 2024-09-21 ENCOUNTER — Other Ambulatory Visit (HOSPITAL_COMMUNITY): Payer: Self-pay

## 2024-09-21 DIAGNOSIS — J101 Influenza due to other identified influenza virus with other respiratory manifestations: Secondary | ICD-10-CM | POA: Diagnosis not present

## 2024-09-21 DIAGNOSIS — Z20822 Contact with and (suspected) exposure to covid-19: Secondary | ICD-10-CM | POA: Diagnosis not present

## 2024-09-21 DIAGNOSIS — R509 Fever, unspecified: Secondary | ICD-10-CM | POA: Diagnosis not present

## 2024-09-21 MED ORDER — OSELTAMIVIR PHOSPHATE 75 MG PO CAPS
75.0000 mg | ORAL_CAPSULE | Freq: Two times a day (BID) | ORAL | 0 refills | Status: DC
  Filled 2024-09-21: qty 20, 10d supply, fill #0

## 2024-09-21 MED ORDER — IBUPROFEN 200 MG PO TABS
600.0000 mg | ORAL_TABLET | Freq: Three times a day (TID) | ORAL | 0 refills | Status: AC | PRN
  Filled 2024-09-21: qty 30, 4d supply, fill #0

## 2024-09-21 MED ORDER — OSELTAMIVIR PHOSPHATE 75 MG PO CAPS
75.0000 mg | ORAL_CAPSULE | Freq: Two times a day (BID) | ORAL | 0 refills | Status: AC
  Filled 2024-09-21: qty 10, 5d supply, fill #0

## 2024-09-22 ENCOUNTER — Other Ambulatory Visit (HOSPITAL_COMMUNITY): Payer: Self-pay

## 2024-09-23 ENCOUNTER — Other Ambulatory Visit: Payer: Self-pay | Admitting: Nurse Practitioner

## 2024-09-23 ENCOUNTER — Other Ambulatory Visit (HOSPITAL_BASED_OUTPATIENT_CLINIC_OR_DEPARTMENT_OTHER): Payer: Self-pay | Admitting: Family

## 2024-09-23 ENCOUNTER — Other Ambulatory Visit: Payer: Self-pay

## 2024-09-23 ENCOUNTER — Other Ambulatory Visit (HOSPITAL_BASED_OUTPATIENT_CLINIC_OR_DEPARTMENT_OTHER): Payer: Self-pay

## 2024-09-23 DIAGNOSIS — E1149 Type 2 diabetes mellitus with other diabetic neurological complication: Secondary | ICD-10-CM

## 2024-09-23 DIAGNOSIS — I25118 Atherosclerotic heart disease of native coronary artery with other forms of angina pectoris: Secondary | ICD-10-CM

## 2024-09-23 MED ORDER — OZEMPIC (1 MG/DOSE) 4 MG/3ML ~~LOC~~ SOPN
1.0000 mg | PEN_INJECTOR | SUBCUTANEOUS | 1 refills | Status: AC
  Filled 2024-09-23: qty 9, 84d supply, fill #0

## 2024-09-24 ENCOUNTER — Ambulatory Visit: Admitting: Licensed Clinical Social Worker

## 2024-09-24 ENCOUNTER — Ambulatory Visit (HOSPITAL_COMMUNITY): Admission: RE | Admit: 2024-09-24 | Source: Ambulatory Visit

## 2024-09-24 NOTE — Progress Notes (Unsigned)
 [facility] Counselor/Therapist Progress Note  Patient ID: Simar Pothier, MRN: [id number]    Date: 09/24/24  Time Spent: ***  {LBBHAMPM:26719} - *** {LBBHAMPM:26719} : *** Minutes  Treatment Type: Individual Therapy.  Reported Symptoms: ***  Mental Status Exam: Appearance:  {PSY:22683}     Behavior: {PSY:21022743}  Motor: {PSY:22302}  Speech/Language:  {PSY:22685}  Affect: {PSY:22687}  Mood: {PSY:31886}  Thought process: {PSY:31888}  Thought content:   {PSY:641-247-2551}  Sensory/Perceptual disturbances:   {PSY:214-317-2205}  Orientation: {PSY:30297}  Attention: {PSY:22877}  Concentration: {PSY:807-101-6601}  Memory: {PSY:304-073-0739}  Fund of knowledge:  {PSY:807-101-6601}  Insight:   {PSY:807-101-6601}  Judgment:  {PSY:807-101-6601}  Impulse Control: {PSY:807-101-6601}   Risk Assessment: Danger to Self:  {PSY:22692} Self-injurious Behavior: {PSY:22692} Danger to Others: {PSY:22692} Duty to Warn:{PSY:311194} Physical Aggression / Violence:{PSY:21197} Access to Firearms a concern: {PSY:21197} Gang Involvement:{PSY:21197}  Subjective:   Microbiologist participated from {Patient Location:[address]::home}, via {LBBHVIDEOORPHONE:26720}, and consented to treatment. Therapist participated from {LBBHPROVIDERLOCATION:26721}. We met online due to COVID pandemic.   ***   Interventions: {[phone]}  Diagnosis: No diagnosis found.   Plan: ***Patient is to use CBT, mindfulness and coping skills to help manage decrease symptoms associated with their diagnosis.   Long-term goal:   ***Reduce overall level, frequency, and intensity of the feelings of depression, anxiety and panic evidenced by       decreased irritability, negative self talk, and helpless feelings from 6 to 7 days/week to 0 to 1 days/week per client report for at least 3 consecutive months.  Short-term goal:  ***Verbally express understanding of the relationship between feelings of depression, anxiety and their  impact on thinking patterns and behaviors. Verbalize an understanding of the role that distorted thinking plays in creating fears, excessive worry, and ruminations.  Damien Junk MSW, LCSW/DATE

## 2024-09-27 ENCOUNTER — Other Ambulatory Visit: Payer: Self-pay

## 2024-09-27 ENCOUNTER — Other Ambulatory Visit (HOSPITAL_COMMUNITY): Payer: Self-pay

## 2024-09-27 MED ORDER — ROSUVASTATIN CALCIUM 5 MG PO TABS
5.0000 mg | ORAL_TABLET | ORAL | 3 refills | Status: AC
  Filled 2024-09-27: qty 36, 84d supply, fill #0
  Filled 2024-10-20: qty 36, 84d supply, fill #1

## 2024-09-28 ENCOUNTER — Other Ambulatory Visit: Payer: Self-pay

## 2024-09-30 ENCOUNTER — Other Ambulatory Visit: Payer: Self-pay

## 2024-10-04 ENCOUNTER — Ambulatory Visit: Payer: Self-pay | Admitting: Nurse Practitioner

## 2024-10-04 ENCOUNTER — Other Ambulatory Visit: Payer: Self-pay

## 2024-10-04 ENCOUNTER — Other Ambulatory Visit (HOSPITAL_COMMUNITY): Payer: Self-pay

## 2024-10-04 VITALS — BP 130/72 | HR 85 | Wt 149.8 lb

## 2024-10-04 DIAGNOSIS — J4540 Moderate persistent asthma, uncomplicated: Secondary | ICD-10-CM | POA: Diagnosis not present

## 2024-10-04 DIAGNOSIS — S30810A Abrasion of lower back and pelvis, initial encounter: Secondary | ICD-10-CM | POA: Diagnosis not present

## 2024-10-04 DIAGNOSIS — E1149 Type 2 diabetes mellitus with other diabetic neurological complication: Secondary | ICD-10-CM | POA: Diagnosis not present

## 2024-10-04 DIAGNOSIS — R2689 Other abnormalities of gait and mobility: Secondary | ICD-10-CM

## 2024-10-04 DIAGNOSIS — J189 Pneumonia, unspecified organism: Secondary | ICD-10-CM

## 2024-10-04 MED ORDER — PREDNISONE 20 MG PO TABS
20.0000 mg | ORAL_TABLET | Freq: Every day | ORAL | 0 refills | Status: DC
  Filled 2024-10-04: qty 3, 3d supply, fill #0

## 2024-10-04 MED ORDER — CEFTRIAXONE SODIUM 500 MG IJ SOLR
500.0000 mg | Freq: Once | INTRAMUSCULAR | Status: AC
  Administered 2024-10-04: 500 mg via INTRAMUSCULAR

## 2024-10-04 MED ORDER — AZITHROMYCIN 250 MG PO TABS
ORAL_TABLET | ORAL | 0 refills | Status: AC
  Filled 2024-10-04: qty 6, 5d supply, fill #0

## 2024-10-04 NOTE — Telephone Encounter (Signed)
 LVM for pt to call back to schedule.

## 2024-10-04 NOTE — Patient Instructions (Addendum)
 Start the 20mg  prednisone  the day after your CT scan.  Continue to use your inhalers.  We have given you Rocephin  (an antibiotic) shot today to help clear this up. I also want you to start the Azithromycin  as soon as the pharmacy delivers this.    Community-Acquired Pneumonia, Adult Pneumonia is a lung infection that causes inflammation and the buildup of mucus and fluids in the lungs. This may cause coughing and difficulty breathing. Community-acquired pneumonia is pneumonia that develops in people who are not, and have not recently been, in a hospital or other health care facility. Usually, pneumonia develops as a result of an illness that is caused by a virus, such as the common cold and the flu (influenza). It can also be caused by bacteria or fungi. While the common cold and influenza can pass from person to person (are contagious), pneumonia itself is not considered contagious. What are the causes? This condition may be caused by: Viruses. Bacteria. Fungi. What increases the risk? The following factors may make you more likely to develop this condition: Being over age 49 or having certain medical conditions, such as: A long-term (chronic) disease, such as: chronic obstructive pulmonary disease (COPD), asthma, heart failure, diabetes, or kidney disease. A condition that increases the risk of breathing in (aspirating) mucus and other fluids from your mouth and nose. A weakened body defense system (immune system). Having had your spleen removed (splenectomy). The spleen is the organ that helps fight germs and infections. Not cleaning your teeth and gums well (poor dental hygiene). Using tobacco products. Traveling to places where germs that cause pneumonia are present or being near certain animals or animal habitats that could have germs that cause pneumonia. What are the signs or symptoms? Symptoms of this condition include: A dry cough or a wet (productive) cough. A fever, sweating, or  chills. Chest pain, especially when breathing deeply or coughing. Fast breathing, difficulty breathing, or shortness of breath. Tiredness (fatigue) and muscle aches. How is this diagnosed? This condition may be diagnosed based on your medical history or a physical exam. You may also have tests, including: Imaging, such as a chest X-ray or lung ultrasound. Tests of: The level of oxygen and other gases in your blood. Mucus from your lungs (sputum). Fluid around your lungs (pleural fluid). Your urine. How is this treated? Treatment for this condition depends on many factors, such as the cause of your pneumonia, your medicines, and other medical conditions that you have. For most adults, pneumonia may be treated at home. In some cases, treatment must happen in a hospital and may include: Medicines that are given by mouth (orally) or through an IV, including: Antibiotic medicines, if bacteria caused the pneumonia. Medicines that kill viruses (antiviral medicines), if a virus caused the pneumonia. Oxygen therapy. Severe pneumonia, although rare, may require the following treatments: Mechanical ventilation.This procedure uses a machine to help you breathe if you cannot breathe well on your own or maintain a safe level of blood oxygen. Thoracentesis. This procedure removes any buildup of pleural fluid to help with breathing. Follow these instructions at home:  Medicines Take over-the-counter and prescription medicines only as told by your health care provider. Take cough medicine only if you have trouble sleeping. Cough medicine can prevent your body from removing mucus from your lungs. If you were prescribed antibiotics, take them as told by your health care provider. Do not stop taking the antibiotic even if you start to feel better. Lifestyle  Do not drink alcohol. Do not use any products that contain nicotine or tobacco. These products include cigarettes, chewing tobacco, and vaping  devices, such as e-cigarettes. If you need help quitting, ask your health care provider. Eat a healthy diet. This includes plenty of vegetables, fruits, whole grains, low-fat dairy products, and lean protein. General instructions Rest a lot and get at least 8 hours of sleep each night. Sleep in a partly upright position at night. Place a few pillows under your head or sleep in a reclining chair. Return to your normal activities as told by your health care provider. Ask your health care provider what activities are safe for you. Drink enough fluid to keep your urine pale yellow. This helps to thin the mucus in your lungs. If your throat is sore, gargle with a mixture of salt and water 3-4 times a day or as needed. To make salt water, completely dissolve -1 tsp (3-6 g) of salt in 1 cup (237 mL) of warm water. Keep all follow-up visits. How is this prevented? You can lower your risk of developing community-acquired pneumonia by: Getting the pneumonia vaccine. There are different types and schedules of pneumonia vaccines. Ask your health care provider which option is best for you. Consider getting the pneumonia vaccine if: You are older than 74 years of age. You are 46-39 years of age and are receiving cancer treatment, have chronic lung disease, or have other medical conditions that affect your immune system. Ask your health care provider if this applies to you. Getting your influenza vaccine every year. Ask your health care provider which type of vaccine is best for you. Getting regular dental checkups. Washing your hands often with soap and water for at least 20 seconds. If soap and water are not available, use hand sanitizer. Contact a health care provider if: You have a fever. You have trouble sleeping because you cannot control your cough with cough medicine. Get help right away if: Your shortness of breath becomes worse. Your chest pain increases. Your sickness becomes worse, especially if  you are an older adult or have a weak immune system. You cough up blood. These symptoms may be an emergency. Get help right away. Call 911. Do not wait to see if the symptoms will go away. Do not drive yourself to the hospital. Summary Pneumonia is an infection of the lungs. Community-acquired pneumonia develops in people who have not been in the hospital. It can be caused by bacteria, viruses, or fungi. This condition may be treated with antibiotics or antiviral medicines. Severe pneumonia may require a hospital stay and treatment to help with breathing. This information is not intended to replace advice given to you by your health care provider. Make sure you discuss any questions you have with your health care provider. Document Revised: 10/01/2023 Document Reviewed: 12/26/2021 Elsevier Patient Education  2025 [employer].

## 2024-10-04 NOTE — Progress Notes (Signed)
 Pam Doing, DNP, AGNP-c [facility]  [address] [place], [place] [address] [phone]  ESTABLISHED PATIENT- Chronic Health and/or Follow-Up Visit on 10/04/2024  Blood pressure 130/72, pulse 85, weight 149 lb 12.8 oz (67.9 kg), SpO2 96%.   HPI:  Assessment and Plan Lower extremity contusions and lacerations after fall Sustained significant contusions and lacerations to the lower extremities after a fall from a height of approximately ten feet. No fractures were identified on x-ray. The injuries are severe with significant swelling and skin splitting, particularly on the back of the legs. No signs of infection currently, but risk of infection due to outdoor debris exposure. - Prescribed Keflex for potential infection if redness or drainage occurs. - Prescribed antibacterial ointment for topical application. - Advised use of dermaplast spray for pain relief and cleaning. - Prescribed Percocet for pain management.  Pain management for acute injury Acute pain following a significant fall with contusions and lacerations. Pain is severe and requires management beyond over-the-counter medications. - Prescribed Percocet for pain management.  Essential hypertension Blood pressure elevated to 179/109 during hospital visit, likely due to stress and pain from the fall. Previous management with doxazosin was effective, but was discontinued by another provider. Blood pressure control is crucial to prevent complications. - Prescribed doxazosin 8 mg for blood pressure management.  Benign prostatic hyperplasia with lower urinary tract symptoms Previously managed with doxazosin, which was effective. Current symptoms may be exacerbated by elevated blood pressure. - Prescribed doxazosin 8 mg for management of urinary symptoms. All ROS negative with exception of what is listed above.    PHYSICAL EXAM Physical Exam Constitutional:      General: She is not in acute  distress.    Appearance: She is ill-appearing. She is not toxic-appearing or diaphoretic.  HENT:     Head: Normocephalic.     Right Ear: Tympanic membrane normal.     Left Ear: Tympanic membrane normal.     Nose:     Comments: Mask in place    Mouth/Throat:     Comments: Mask in place Eyes:     Pupils: Pupils are equal, round, and reactive to light.  Neck:     Vascular: No carotid bruit.  Cardiovascular:     Rate and Rhythm: Normal rate and regular rhythm.     Pulses: Normal pulses.     Heart sounds: Normal heart sounds.  Pulmonary:     Effort: Prolonged expiration present.     Breath sounds: Examination of the right-middle field reveals wheezing. Examination of the right-lower field reveals wheezing and rhonchi. Wheezing and rhonchi present.  Musculoskeletal:     Cervical back: Neck supple.  Lymphadenopathy:     Cervical: Cervical adenopathy present.  Skin:    General: Skin is warm and dry.     Capillary Refill: Capillary refill takes less than 2 seconds.  Neurological:     Mental Status: She is alert and oriented to person, place, and time.  Psychiatric:        Mood and Affect: Mood normal.     PLAN Problem List Items Addressed This Visit     Type 2 diabetes mellitus with neurological complications (HCC) - Primary   Relevant Orders   Microalbumin/Creatinine Ratio, Urine (Completed)   CBC with Differential/Platelet (Completed)   Other Visit Diagnoses       Community acquired pneumonia of right lower lobe of lung       Relevant Medications   predniSONE  (DELTASONE ) 20 MG tablet   cefTRIAXone  (  ROCEPHIN ) injection 500 mg (Completed)   azithromycin  (ZITHROMAX ) 250 MG tablet   Other Relevant Orders   CBC with Differential/Platelet (Completed)       Assessment and Plan Pneumonia, right lower lobe Suspected right lower lobe pneumonia with persistent cough, mucus production with blood specks, and fluid rattling in the right lung. Symptoms have persisted for three  weeks post-flu. No high fevers or severe symptoms currently. Oxygen levels are good. Differential includes post-viral bronchitis, but pneumonia is suspected due to lung findings. - Administered Rocephin  injection today. - Prescribed azithromycin  to start upon arrival. - Prescribed prednisone  for two additional days post-CT scan if symptoms persist. - Checked complete blood count to assess white blood cell count.  Type 2 diabetes mellitus with diabetic neuropathy Diabetes management is well-controlled with occasional hyperglycemia and hypoglycemia. Prednisone  use for CT scan may affect glucose levels. She uses a continuous glucose monitor and has been managing diet effectively. - Continue current diabetes management plan.  Recurrent excoriation of gluteal cleft Recurrent sacral excoriation, likely exacerbated by immobility and sweating. Current treatment with coconut oil, tea tree oil, and hydrocolloid Band-Aids is effective. - Continue current topical treatment with coconut oil, tea tree oil, and hydrocolloid Band-Aids.  Asthma Managed with Symbicort  and albuterol  inhalers. She has been using albuterol  despite no recent exacerbations. - Continue current asthma management with Symbicort  and albuterol  inhalers.  Dizziness and near falls Intermittent dizziness and near falls, possibly related to respiratory issues or other underlying conditions. She has not fallen recently and uses an Apple Watch to monitor balance. - Proceed with scheduled cardiac CT and echocardiogram to evaluate dizziness and falls.  Return in about 6 weeks (around 11/15/2024) for Med Management 45.  Pam Zahmir Lalla, DNP, AGNP-c Time: 37 minutes, >50% spent counseling, care coordination, chart review, and documentation.  SABRAtime

## 2024-10-05 ENCOUNTER — Ambulatory Visit: Payer: Self-pay | Admitting: Nurse Practitioner

## 2024-10-05 ENCOUNTER — Ambulatory Visit (INDEPENDENT_AMBULATORY_CARE_PROVIDER_SITE_OTHER)

## 2024-10-05 DIAGNOSIS — I2510 Atherosclerotic heart disease of native coronary artery without angina pectoris: Secondary | ICD-10-CM | POA: Diagnosis not present

## 2024-10-05 DIAGNOSIS — R072 Precordial pain: Secondary | ICD-10-CM | POA: Diagnosis not present

## 2024-10-05 DIAGNOSIS — E785 Hyperlipidemia, unspecified: Secondary | ICD-10-CM

## 2024-10-05 DIAGNOSIS — R0609 Other forms of dyspnea: Secondary | ICD-10-CM | POA: Diagnosis not present

## 2024-10-05 DIAGNOSIS — R55 Syncope and collapse: Secondary | ICD-10-CM

## 2024-10-05 LAB — CBC WITH DIFFERENTIAL/PLATELET
Basophils Absolute: 0 x10E3/uL (ref 0.0–0.2)
Basos: 1 %
EOS (ABSOLUTE): 0.2 x10E3/uL (ref 0.0–0.4)
Eos: 4 %
Hematocrit: 39.9 % (ref 34.0–46.6)
Hemoglobin: 12.6 g/dL (ref 11.1–15.9)
Immature Grans (Abs): 0 x10E3/uL (ref 0.0–0.1)
Immature Granulocytes: 0 %
Lymphocytes Absolute: 2.1 x10E3/uL (ref 0.7–3.1)
Lymphs: 47 %
MCH: 26.5 pg — ABNORMAL LOW (ref 26.6–33.0)
MCHC: 31.6 g/dL (ref 31.5–35.7)
MCV: 84 fL (ref 79–97)
Monocytes Absolute: 0.4 x10E3/uL (ref 0.1–0.9)
Monocytes: 9 %
Neutrophils Absolute: 1.8 x10E3/uL (ref 1.4–7.0)
Neutrophils: 39 %
Platelets: 360 x10E3/uL (ref 150–450)
RBC: 4.76 x10E6/uL (ref 3.77–5.28)
RDW: 13.8 % (ref 11.7–15.4)
WBC: 4.5 x10E3/uL (ref 3.4–10.8)

## 2024-10-05 LAB — MICROALBUMIN / CREATININE URINE RATIO
Creatinine, Urine: 248.2 mg/dL
Microalb/Creat Ratio: 11 mg/g{creat} (ref 0–29)
Microalbumin, Urine: 26.3 ug/mL

## 2024-10-05 LAB — ECHOCARDIOGRAM COMPLETE

## 2024-10-06 ENCOUNTER — Telehealth (HOSPITAL_COMMUNITY): Payer: Self-pay | Admitting: Emergency Medicine

## 2024-10-06 NOTE — Telephone Encounter (Signed)
 Attempted to call patient regarding upcoming cardiac CT appointment. Left message on voicemail with name and callback number Rockwell Alexandria RN Navigator Cardiac Imaging [facility] [phone] Office [phone] Cell

## 2024-10-06 NOTE — Progress Notes (Signed)
 Spoke with patient regarding echo results. Patient has been notified directly; all questions, if any, were answered. Patient voiced understanding.  Patient is scheduled to have echo on Friday and will will start zio on Saturday

## 2024-10-06 NOTE — Telephone Encounter (Signed)
 Reaching out to patient to offer assistance regarding upcoming cardiac imaging study; pt verbalizes understanding of appt date/time, parking situation and where to check in, pre-test NPO status and medications ordered, and verified current allergies; name and call back number provided for further questions should they arise Rockwell Alexandria RN Navigator Cardiac Imaging [facility] [phone] office [phone] cell

## 2024-10-08 ENCOUNTER — Other Ambulatory Visit: Payer: Self-pay | Admitting: Internal Medicine

## 2024-10-08 ENCOUNTER — Ambulatory Visit (HOSPITAL_COMMUNITY)
Admission: RE | Admit: 2024-10-08 | Discharge: 2024-10-08 | Disposition: A | Source: Ambulatory Visit | Attending: Emergency Medicine

## 2024-10-08 ENCOUNTER — Ambulatory Visit (HOSPITAL_BASED_OUTPATIENT_CLINIC_OR_DEPARTMENT_OTHER)
Admission: RE | Admit: 2024-10-08 | Discharge: 2024-10-08 | Disposition: A | Discharge status: Home / Self Care | Source: Ambulatory Visit | Attending: Internal Medicine | Admitting: Internal Medicine

## 2024-10-08 DIAGNOSIS — R931 Abnormal findings on diagnostic imaging of heart and coronary circulation: Secondary | ICD-10-CM | POA: Insufficient documentation

## 2024-10-08 DIAGNOSIS — R55 Syncope and collapse: Secondary | ICD-10-CM | POA: Diagnosis not present

## 2024-10-08 DIAGNOSIS — R0609 Other forms of dyspnea: Secondary | ICD-10-CM | POA: Diagnosis not present

## 2024-10-08 DIAGNOSIS — R072 Precordial pain: Secondary | ICD-10-CM | POA: Insufficient documentation

## 2024-10-08 DIAGNOSIS — I2510 Atherosclerotic heart disease of native coronary artery without angina pectoris: Secondary | ICD-10-CM | POA: Diagnosis not present

## 2024-10-08 MED ORDER — IOHEXOL 350 MG/ML SOLN
100.0000 mL | Freq: Once | INTRAVENOUS | Status: AC | PRN
  Administered 2024-10-08: 100 mL via INTRAVENOUS

## 2024-10-08 MED ORDER — NITROGLYCERIN 0.4 MG SL SUBL
0.8000 mg | SUBLINGUAL_TABLET | Freq: Once | SUBLINGUAL | Status: AC
  Administered 2024-10-08: 0.8 mg via SUBLINGUAL

## 2024-10-08 MED ORDER — DILTIAZEM HCL 25 MG/5ML IV SOLN
10.0000 mg | INTRAVENOUS | Status: DC | PRN
  Administered 2024-10-08: 10 mg via INTRAVENOUS

## 2024-10-09 ENCOUNTER — Encounter: Payer: Self-pay | Admitting: Nurse Practitioner

## 2024-10-09 DIAGNOSIS — J189 Pneumonia, unspecified organism: Secondary | ICD-10-CM | POA: Insufficient documentation

## 2024-10-09 DIAGNOSIS — S30810A Abrasion of lower back and pelvis, initial encounter: Secondary | ICD-10-CM | POA: Insufficient documentation

## 2024-10-20 ENCOUNTER — Other Ambulatory Visit: Payer: Self-pay

## 2024-10-20 ENCOUNTER — Other Ambulatory Visit (HOSPITAL_COMMUNITY): Payer: Self-pay

## 2024-10-20 ENCOUNTER — Telehealth: Payer: Self-pay | Admitting: Neurology

## 2024-10-20 NOTE — Telephone Encounter (Signed)
 Patient is scheduled at [facility] for 11/30/2024 at 9 pm,.  Mailed packet and sent mychar.  The authorization will expire before the appointment. I will redo it.

## 2024-10-28 NOTE — Progress Notes (Signed)
 Pam [name]                                          MRN: [id number]   10/28/2024   The [facility]BCI Quality Team Specialist reviewed this patient medical record for the purposes of chart review for care gap closure. The following were reviewed: abstraction for care gap closure-kidney health evaluation for diabetes:eGFR  and uACR.    VBCI Quality Team

## 2024-10-30 ENCOUNTER — Other Ambulatory Visit: Payer: Self-pay | Admitting: Family Medicine

## 2024-11-01 ENCOUNTER — Other Ambulatory Visit: Payer: Self-pay

## 2024-11-01 ENCOUNTER — Other Ambulatory Visit (HOSPITAL_COMMUNITY): Payer: Self-pay

## 2024-11-01 MED ORDER — MOMETASONE FUROATE 50 MCG/ACT NA SUSP
2.0000 | Freq: Every day | NASAL | 0 refills | Status: DC
  Filled 2024-11-01: qty 51, 90d supply, fill #0

## 2024-11-15 ENCOUNTER — Other Ambulatory Visit (HOSPITAL_COMMUNITY): Payer: Self-pay

## 2024-11-15 ENCOUNTER — Ambulatory Visit: Admitting: Internal Medicine

## 2024-11-15 ENCOUNTER — Encounter: Payer: Self-pay | Admitting: Internal Medicine

## 2024-11-15 ENCOUNTER — Other Ambulatory Visit: Payer: Self-pay

## 2024-11-15 VITALS — BP 122/72 | HR 88 | Temp 97.6°F | Ht 60.0 in | Wt 150.1 lb

## 2024-11-15 DIAGNOSIS — J302 Other seasonal allergic rhinitis: Secondary | ICD-10-CM | POA: Diagnosis not present

## 2024-11-15 DIAGNOSIS — J4540 Moderate persistent asthma, uncomplicated: Secondary | ICD-10-CM

## 2024-11-15 DIAGNOSIS — J3089 Other allergic rhinitis: Secondary | ICD-10-CM

## 2024-11-15 MED ORDER — ALBUTEROL SULFATE HFA 108 (90 BASE) MCG/ACT IN AERS
1.0000 | INHALATION_SPRAY | RESPIRATORY_TRACT | 1 refills | Status: AC | PRN
  Filled 2024-11-15: qty 18, 33d supply, fill #0

## 2024-11-15 MED ORDER — BUDESONIDE-FORMOTEROL FUMARATE 160-4.5 MCG/ACT IN AERO
2.0000 | INHALATION_SPRAY | Freq: Two times a day (BID) | RESPIRATORY_TRACT | 5 refills | Status: AC
  Filled 2024-11-15: qty 10.2, 30d supply, fill #0

## 2024-11-15 MED ORDER — MONTELUKAST SODIUM 10 MG PO TABS
10.0000 mg | ORAL_TABLET | Freq: Every day | ORAL | 3 refills | Status: AC
  Filled 2024-11-15: qty 90, 90d supply, fill #0

## 2024-11-15 MED ORDER — MOMETASONE FUROATE 50 MCG/ACT NA SUSP
2.0000 | Freq: Every day | NASAL | 0 refills | Status: AC
  Filled 2024-11-15: qty 51, 90d supply, fill #0

## 2024-11-15 MED ORDER — LEVOCETIRIZINE DIHYDROCHLORIDE 5 MG PO TABS
5.0000 mg | ORAL_TABLET | Freq: Every evening | ORAL | 3 refills | Status: AC
  Filled 2024-11-15: qty 90, 90d supply, fill #0

## 2024-11-15 NOTE — Addendum Note (Signed)
 Addended by: Jaevian Shean on: 11/15/2024 04:45 PM   Modules accepted: Orders

## 2024-11-15 NOTE — Progress Notes (Signed)
 "  FOLLOW UP Date of Service/Encounter:   11/15/2024  Subjective:  Pam [name] (DOB: [birth year]) is a 74 y.o. female who [facility] in re-evaluation of the following: asthma, allergic rhinitis History obtained from: chart review and patient.  For Review, LV was on 05/07/24  with Pam Mutter, FNP seen for acute visit for uncontrolled asthma. See below for summary of history and diagnostics.   Therapeutic plans/changes recommended: restarted on Symbicort  160 ----------------------------------------------------- Pertinent History/Diagnostics:  Spirometry 2018: FEV1: 1.2L  72%, FVC: 2.39L  115%, ratio consistent with Obstructive pattern  She had a 11% increase in FEV1 to 1.34L or 80% following [facility] Allergy testing 2018: environmental allergen panel positive for grasses, weeds, trees, dust mites, cat, dog, horse, cockroach.  Intradermal testing for mold is negative.   Patient reported that her ID did turn positive after her last visit when leaving. CXR 2019: normal Hospital admission 07/2022 for dizziness and syncope thought secondary to vertigo and possible vagal with zio monitor showing rare PACs and PVCs and 8 beat sof SVT.  Cardiac CT reealed moderate stenosis in D1, mild concentric LV hypertrophy and Grade 1 diastolic dysfunction.  She was started on rosuvastatin .   --------------------------------------------------- Today presents for follow-up. Discussed the use of AI scribe software for clinical note transcription with the patient, who gave verbal consent to proceed.  History of Present Illness Pam [name] Pam [name] or Pam [name] is a 74 year old female with asthma who presents with persistent cough and recent asthma exacerbation.  Cough and asthma exacerbation - Persistent cough since August with a brief period of improvement before recurrence three days ago - Asthma attack on Saturday morning, awakening at 3 AM and unable to return to  sleep until 5 AM - Uses Symbicort  inconsistently, typically once daily or less, due to inconvenience of spacer - suspect not using 5 days per week - Current medications include levocetirizine and a nasal spray - Inhaler use is infrequent due to inconvenience of using spacer  Recent lower respiratory infection - Diagnosed with walking pneumonia in November, characterized by fluid in one lung - Symptoms of pneumonia persisted until just before Christmas, resulting in prolonged fatigue and incomplete recovery - No follow-up imaging performed since pneumonia diagnosis  Syncope and neurological symptoms - Episodes of dizziness and syncope resulting in falls - Incident of sudden hand weakness, resulting in dropping a plate - Undergoing multiple diagnostic tests, including a sleep apnea test scheduled at the end of the month and a cardiology evaluation  Covid-19 infection - COVID-19 infection in August - Chest X-ray performed in the emergency room during COVID-19 illness  Cognitive and neurological evaluation - MR brain scan performed in 2020 - Neuropsychological evaluation completed to assess for dementia which did not show dementia reportedly  Chart Review: 09/2024-PCP visit for pneumonia, given rocephin , azithromycin , prednisone  10/08/24: CT coronary: No significant extracardiac findings within the visualized chest.  10/08/24: CT coronary: 1. CT FFR analysis shows no evidence of significant functional stenosis. 08/23/23: MR Brain: 1. No cerebellopontine angle or internal auditory canal mass. 2. No etiology of hearing loss is identified. 3. Mild chronic small vessel ischemic changes within the cerebral white matter, similar to the prior brain MRI of 08/05/2022  All medications reviewed by clinical staff and updated in chart. No new pertinent medical or surgical history except as noted in HPI.  ROS: All others negative except as noted per HPI.   Objective:  BP 122/72 (BP Location:  Left Arm, Patient Position: Sitting, Cuff Size: Normal)   Pulse 88   Temp 97.6 F (36.4 C) (Temporal)   Ht 5' (1.524 m)   Wt 150 lb 1.6 oz (68.1 kg)   SpO2 99%   BMI 29.31 kg/m  Body mass index is 29.31 kg/m. Physical Exam: General Appearance:  Alert, cooperative, no distress, appears stated age  Head:  Normocephalic, without obvious abnormality, atraumatic  Eyes:  Conjunctiva clear, EOM's intact  Ears EACs normal bilaterally and normal TMs bilaterally  Nose: Nares normal, hypertrophic turbinates, normal mucosa, and no visible anterior polyps  Throat: Lips, tongue normal; teeth and gums normal, normal posterior oropharynx  Neck: Supple, symmetrical  Lungs:   clear to auscultation bilaterally, Respirations unlabored, no coughing  Heart:  regular rate and rhythm and no murmur, Appears well perfused  Extremities: No edema  Skin: Skin color, texture, turgor normal and no rashes or lesions on visualized portions of skin  Neurologic: No gross deficits   Labs:  Lab Orders  No laboratory test(s) ordered today    Spirometry:  Tracings reviewed. Her effort: Good reproducible efforts. FVC: 1.77L pre, 2.12 post FEV1: 1.08L, 66% predicted pre, 1.29L and 79 % post (+19% improvement) FEV1/FVC ratio: 0.61 pre and 0.61 post Interpretation: Spirometry consistent with mild obstructive disease. With significant improvement following bronchodilator Please see scanned spirometry results for details.   Assessment/Plan   Asthma-not controlled Lungs sound clear, going to try and hold systemic steroids in favor or inhaled, but if no improvement or worsening symptoms, she will call and we will send prednisone  Restart Symbicort  160-2 puffs twice a day with a spacer for asthma control-okay to use without a spacer.  Continue albuterol  2 puffs once every 4 hours if needed for cough or wheeze You may use albuterol  2 puffs 5 to 15 minutes before activity to decrease cough or wheeze For future asthma  flare, begin Pulmicort  0.5 mg via nebulizer twice a day for 2 weeks or until cough and wheeze free, then stop  Allergic rhinitis-at goal Continue allergen avoidance measures directed toward grass pollen, weed pollen, tree pollen, dust mite, dog, cat, cockroach, and horse as listed below Continue an antihistamine once a day if needed for runny nose or itch.Remember to rotate to a different antihistamine about every 3 months. Some examples of over the counter antihistamines include Zyrtec (cetirizine), Xyzal  (levocetirizine), Allegra (fexofenadine), and Claritin  (loratidine).  Continue Rhinocort  1 to 2 sprays in each nostril once a day if needed for stuffy nose.  In the right nostril, point the applicator out toward the right ear. In the left nostril, point the applicator out toward the left ear Consider saline nasal rinses as needed for nasal symptoms. Use this before any medicated nasal sprays for best result  Call the clinic if this treatment plan is not working well for you  Follow up in 6 months or sooner if needed.  Other: none  Rocky Endow, MD  [facility]        "

## 2024-11-15 NOTE — Patient Instructions (Addendum)
 Asthma-not controlled Lungs sound clear, going to try and hold systemic steroids in favor or inhaled, but if no improvement or worsening symptoms, she will call and we will send prednisone  Restart Symbicort  160-2 puffs twice a day with a spacer for asthma control-okay to use without a spacer.  Continue albuterol  2 puffs once every 4 hours if needed for cough or wheeze You may use albuterol  2 puffs 5 to 15 minutes before activity to decrease cough or wheeze For future asthma flare, begin Pulmicort  0.5 mg via nebulizer twice a day for 2 weeks or until cough and wheeze free, then stop  Allergic rhinitis-at goal Continue allergen avoidance measures directed toward grass pollen, weed pollen, tree pollen, dust mite, dog, cat, cockroach, and horse as listed below Continue an antihistamine once a day if needed for runny nose or itch.Remember to rotate to a different antihistamine about every 3 months. Some examples of over the counter antihistamines include Zyrtec (cetirizine), Xyzal  (levocetirizine), Allegra (fexofenadine), and Claritin  (loratidine).  Continue Rhinocort  1 to 2 sprays in each nostril once a day if needed for stuffy nose.  In the right nostril, point the applicator out toward the right ear. In the left nostril, point the applicator out toward the left ear Consider saline nasal rinses as needed for nasal symptoms. Use this before any medicated nasal sprays for best result  Call the clinic if this treatment plan is not working well for you  Follow up in 6 months or sooner if needed.  Reducing Pollen Exposure The American Academy of Allergy, Asthma and Immunology suggests the following steps to reduce your exposure to pollen during allergy seasons. Do not hang sheets or clothing out to dry; pollen may collect on these items. Do not mow lawns or spend time around freshly cut grass; mowing stirs up pollen. Keep windows closed at night.  Keep car windows closed while driving. Minimize morning  activities outdoors, a time when pollen counts are usually at their highest. Stay indoors as much as possible when pollen counts or humidity is high and on windy days when pollen tends to remain in the air longer. Use air conditioning when possible.  Many air conditioners have filters that trap the pollen spores. Use a HEPA room air filter to remove pollen form the indoor air you breathe.  Control of Mold Allergen Mold and fungi can grow on a variety of surfaces provided certain temperature and moisture conditions exist.  Outdoor molds grow on plants, decaying vegetation and soil.  The major outdoor mold, Alternaria and Cladosporium, are found in very high numbers during hot and dry conditions.  Generally, a late Summer - Fall peak is seen for common outdoor fungal spores.  Rain will temporarily lower outdoor mold spore count, but counts rise rapidly when the rainy period ends.  The most important indoor molds are Aspergillus and Penicillium.  Dark, humid and poorly ventilated basements are ideal sites for mold growth.  The next most common sites of mold growth are the bathroom and the kitchen.  Outdoor [employer] Use air conditioning and keep windows closed Avoid exposure to decaying vegetation. Avoid leaf raking. Avoid grain handling. Consider wearing a face mask if working in moldy areas.  Indoor Mold Control Maintain humidity below 50%. Clean washable surfaces with 5% bleach solution. Remove sources e.g. Contaminated carpets.   Control of Dust Mite Allergen Dust mites play a major role in allergic asthma and rhinitis. They occur in environments with high humidity wherever human skin is found.  Dust mites absorb humidity from the atmosphere (ie, they do not drink) and feed on organic matter (including shed human and animal skin). Dust mites are a microscopic type of insect that you cannot see with the naked eye. High levels of dust mites have been detected from mattresses, pillows, carpets,  upholstered furniture, bed covers, clothes, soft toys and any woven material. The principal allergen of the dust mite is found in its feces. A gram of dust may contain 1,000 mites and 250,000 fecal particles. Mite antigen is easily measured in the air during house cleaning activities. Dust mites do not bite and do not cause harm to humans, other than by triggering allergies/asthma.  Ways to decrease your exposure to dust mites in your home:  1. Encase mattresses, box springs and pillows with a mite-impermeable barrier or cover  2. Wash sheets, blankets and drapes weekly in hot water (130 F) with detergent and dry them in a dryer on the hot setting.  3. Have the room cleaned frequently with a vacuum cleaner and a damp dust-mop. For carpeting or rugs, vacuuming with a vacuum cleaner equipped with a high-efficiency particulate air (HEPA) filter. The dust mite allergic individual should not be in a room which is being cleaned and should wait 1 hour after cleaning before going into the room.  4. Do not sleep on upholstered furniture (eg, couches).  5. If possible removing carpeting, upholstered furniture and drapery from the home is ideal. Horizontal blinds should be eliminated in the rooms where the person spends the most time (bedroom, study, television room). Washable vinyl, roller-type shades are optimal.  6. Remove all non-washable stuffed toys from the bedroom. Wash stuffed toys weekly like sheets and blankets above.  7. Reduce indoor humidity to less than 50%. Inexpensive humidity monitors can be purchased at most hardware stores. Do not use a humidifier as can make the problem worse and are not recommended.  Control of Dog or Cat Allergen Avoidance is the best way to manage a dog or cat allergy. If you have a dog or cat and are allergic to dog or cats, consider removing the dog or cat from the home. If you have a dog or cat but dont want to find it a new home, or if your family wants a pet  even though someone in the household is allergic, here are some strategies that may help keep symptoms at bay:  Keep the pet out of your bedroom and restrict it to only a few rooms. Be advised that keeping the dog or cat in only one room will not limit the allergens to that room. Dont pet, hug or kiss the dog or cat; if you do, wash your hands with soap and water. High-efficiency particulate air (HEPA) cleaners run continuously in a bedroom or living room can reduce allergen levels over time. Regular use of a high-efficiency vacuum cleaner or a central vacuum can reduce allergen levels. Giving your dog or cat a bath at least once a week can reduce airborne allergen.  Control of Cockroach Allergen Cockroach allergen has been identified as an important cause of acute attacks of asthma, especially in urban settings.  There are fifty-five species of cockroach that exist in the [place] , however only three, the American, German and Oriental species produce allergen that can affect patients with Asthma.  Allergens can be obtained from fecal particles, egg casings and secretions from cockroaches.    Remove food sources. Reduce access to water. Seal access and entry  points. Spray runways with 0.5-1% Diazinon or Chlorpyrifos Blow boric acid power under stoves and refrigerator. Place bait stations (hydramethylnon) at feeding sites.

## 2024-11-16 ENCOUNTER — Encounter: Payer: Self-pay | Admitting: Pharmacist

## 2024-11-16 ENCOUNTER — Other Ambulatory Visit (HOSPITAL_COMMUNITY): Payer: Self-pay

## 2024-11-16 ENCOUNTER — Other Ambulatory Visit: Payer: Self-pay

## 2024-11-17 ENCOUNTER — Telehealth: Payer: Self-pay

## 2024-11-17 ENCOUNTER — Other Ambulatory Visit: Payer: Self-pay

## 2024-11-17 ENCOUNTER — Other Ambulatory Visit (HOSPITAL_COMMUNITY): Payer: Self-pay

## 2024-11-17 NOTE — Telephone Encounter (Signed)
 Copied from CRM [id number]. Topic: Clinical - Medication Question >> Nov 17, 2024  2:45 PM Hadassah PARAS wrote: Reason for CRM: Glucose meter no longer eligible for delivery. Please increase # of devices on prescription as pt  can no longer drive due to health issues so she could plan transportation as needed as it is limited and will be using [employer]. Please advise pt on #[phone], please leave vm as pt has had trouble w phone. >> Nov 17, 2024  3:29 PM Treva T wrote: Received call from pt, states per insurance and pharmacy, prescription for [place] 3 needs a PA, as the authorization on file that was previously on used was for [place] 2 and expired 10/2024.  Pt is requesting PA is done fro [place] [facility] so she can pick up from pharmacy as soon as possible, to continue to monitor blood sugar.   Preferred pharamacy:  [facility] [address] [place] [address] Phone: [phone] Fax: [phone] Hours: Mon-Fri 7:30a-7p; Sat 8a-4:30p; Sun 10a-2p   Pt requesting a follow up call once this has been completed, can be reached at [phone].

## 2024-11-18 ENCOUNTER — Other Ambulatory Visit (HOSPITAL_COMMUNITY): Payer: Self-pay

## 2024-11-18 ENCOUNTER — Telehealth (HOSPITAL_COMMUNITY): Payer: Self-pay

## 2024-11-18 ENCOUNTER — Other Ambulatory Visit: Payer: Self-pay

## 2024-11-19 ENCOUNTER — Emergency Department (HOSPITAL_COMMUNITY)

## 2024-11-19 ENCOUNTER — Observation Stay (HOSPITAL_COMMUNITY)
Admission: EM | Admit: 2024-11-19 | Discharge: 2024-11-21 | Disposition: A | Discharge status: Home Health | Attending: Internal Medicine | Admitting: Internal Medicine

## 2024-11-19 ENCOUNTER — Telehealth (HOSPITAL_COMMUNITY): Payer: Self-pay

## 2024-11-19 ENCOUNTER — Other Ambulatory Visit (HOSPITAL_COMMUNITY): Payer: Self-pay

## 2024-11-19 ENCOUNTER — Other Ambulatory Visit: Payer: Self-pay

## 2024-11-19 ENCOUNTER — Encounter (HOSPITAL_COMMUNITY): Payer: Self-pay

## 2024-11-19 DIAGNOSIS — R42 Dizziness and giddiness: Secondary | ICD-10-CM

## 2024-11-19 DIAGNOSIS — R55 Syncope and collapse: Secondary | ICD-10-CM | POA: Diagnosis present

## 2024-11-19 DIAGNOSIS — I2510 Atherosclerotic heart disease of native coronary artery without angina pectoris: Secondary | ICD-10-CM | POA: Diagnosis not present

## 2024-11-19 DIAGNOSIS — Z91040 Latex allergy status: Secondary | ICD-10-CM | POA: Diagnosis not present

## 2024-11-19 DIAGNOSIS — Z79899 Other long term (current) drug therapy: Secondary | ICD-10-CM | POA: Diagnosis not present

## 2024-11-19 DIAGNOSIS — Z8616 Personal history of COVID-19: Secondary | ICD-10-CM | POA: Insufficient documentation

## 2024-11-19 DIAGNOSIS — Z7982 Long term (current) use of aspirin: Secondary | ICD-10-CM | POA: Insufficient documentation

## 2024-11-19 DIAGNOSIS — J45998 Other asthma: Secondary | ICD-10-CM | POA: Diagnosis not present

## 2024-11-19 DIAGNOSIS — E1149 Type 2 diabetes mellitus with other diabetic neurological complication: Secondary | ICD-10-CM | POA: Diagnosis not present

## 2024-11-19 DIAGNOSIS — Z87891 Personal history of nicotine dependence: Secondary | ICD-10-CM | POA: Diagnosis not present

## 2024-11-19 DIAGNOSIS — J45909 Unspecified asthma, uncomplicated: Secondary | ICD-10-CM | POA: Insufficient documentation

## 2024-11-19 DIAGNOSIS — E1169 Type 2 diabetes mellitus with other specified complication: Secondary | ICD-10-CM | POA: Insufficient documentation

## 2024-11-19 DIAGNOSIS — E785 Hyperlipidemia, unspecified: Secondary | ICD-10-CM | POA: Insufficient documentation

## 2024-11-19 DIAGNOSIS — Z7985 Long-term (current) use of injectable non-insulin antidiabetic drugs: Secondary | ICD-10-CM | POA: Diagnosis not present

## 2024-11-19 DIAGNOSIS — S0990XA Unspecified injury of head, initial encounter: Principal | ICD-10-CM

## 2024-11-19 LAB — COMPREHENSIVE METABOLIC PANEL WITH GFR
ALT: 10 U/L (ref 0–44)
AST: 19 U/L (ref 15–41)
Albumin: 4.1 g/dL (ref 3.5–5.0)
Alkaline Phosphatase: 67 U/L (ref 38–126)
Anion gap: 10 (ref 5–15)
BUN: 18 mg/dL (ref 8–23)
CO2: 24 mmol/L (ref 22–32)
Calcium: 9.9 mg/dL (ref 8.9–10.3)
Chloride: 106 mmol/L (ref 98–111)
Creatinine, Ser: 0.6 mg/dL (ref 0.44–1.00)
GFR, Estimated: >60 mL/min
Glucose, Bld: 148 mg/dL — ABNORMAL HIGH (ref 70–99)
Potassium: 3.8 mmol/L (ref 3.5–5.1)
Sodium: 140 mmol/L (ref 135–145)
Total Bilirubin: 0.2 mg/dL (ref 0.0–1.2)
Total Protein: 7.4 g/dL (ref 6.5–8.1)

## 2024-11-19 LAB — URINALYSIS, ROUTINE W REFLEX MICROSCOPIC
Bilirubin Urine: NEGATIVE
Glucose, UA: NEGATIVE mg/dL
Hgb urine dipstick: NEGATIVE
Ketones, ur: 5 mg/dL — AB
Leukocytes,Ua: NEGATIVE
Nitrite: NEGATIVE
Protein, ur: NEGATIVE mg/dL
Specific Gravity, Urine: 1.024 (ref 1.005–1.030)
pH: 7 (ref 5.0–8.0)

## 2024-11-19 LAB — CBC WITH DIFFERENTIAL/PLATELET
Abs Immature Granulocytes: 0.01 K/uL (ref 0.00–0.07)
Basophils Absolute: 0 K/uL (ref 0.0–0.1)
Basophils Relative: 0 %
Eosinophils Absolute: 0 K/uL (ref 0.0–0.5)
Eosinophils Relative: 0 %
HCT: 36.8 % (ref 36.0–46.0)
Hemoglobin: 12.4 g/dL (ref 12.0–15.0)
Immature Granulocytes: 0 %
Lymphocytes Relative: 29 %
Lymphs Abs: 1.5 K/uL (ref 0.7–4.0)
MCH: 26.8 pg (ref 26.0–34.0)
MCHC: 33.7 g/dL (ref 30.0–36.0)
MCV: 79.5 fL — ABNORMAL LOW (ref 80.0–100.0)
Monocytes Absolute: 0.3 K/uL (ref 0.1–1.0)
Monocytes Relative: 5 %
Neutro Abs: 3.4 K/uL (ref 1.7–7.7)
Neutrophils Relative %: 66 %
Platelets: 271 K/uL (ref 150–400)
RBC: 4.63 MIL/uL (ref 3.87–5.11)
RDW: 13.5 % (ref 11.5–15.5)
WBC: 5.2 K/uL (ref 4.0–10.5)
nRBC: 0 % (ref 0.0–0.2)

## 2024-11-19 LAB — TROPONIN T, HIGH SENSITIVITY: Troponin T High Sensitivity: <15 ng/L (ref 0–19)

## 2024-11-19 MED ORDER — MECLIZINE HCL 25 MG PO TABS
25.0000 mg | ORAL_TABLET | Freq: Once | ORAL | Status: AC
  Administered 2024-11-20: 25 mg via ORAL
  Filled 2024-11-19: qty 1

## 2024-11-19 MED ORDER — KETOROLAC TROMETHAMINE 15 MG/ML IJ SOLN
15.0000 mg | Freq: Once | INTRAMUSCULAR | Status: AC
  Administered 2024-11-20: 15 mg via INTRAVENOUS
  Filled 2024-11-19: qty 1

## 2024-11-19 MED ORDER — ONDANSETRON 4 MG PO TBDP
4.0000 mg | ORAL_TABLET | Freq: Once | ORAL | Status: AC
  Administered 2024-11-20: 4 mg via ORAL
  Filled 2024-11-19: qty 1

## 2024-11-19 NOTE — Telephone Encounter (Signed)
 PA request has been Received. New Encounter has been or will be created for follow up. For additional info see Pharmacy Prior Auth telephone encounter from 11/19/24.

## 2024-11-19 NOTE — ED Triage Notes (Signed)
 Arrives POV with family after a syncopal episode this morning ~11am.   Pt says she has a history of frequent syncopal episodes and currently evaluated by Cardiologist.   Small laceration on back of head that has since scabbed over. No current bleeding.   Denies anticoagulants.

## 2024-11-19 NOTE — ED Provider Notes (Signed)
 " [place] EMERGENCY DEPARTMENT AT [facility] Provider Note   CSN: [id number] Arrival date & time: 11/19/24  8082     Patient presents with: Head Injury   [name] or [name] Vanduyn is a 74 y.o. female with a past medical history of fibromyalgia, T2DM, HLD, asthma, vestibular hypofunction, anemia, OSA presents to emergency department for evaluation of syncopal episode this morning at 1100.  She has a history of 4 syncopal episodes over the past 2 years and is currently being followed by cardiologist and neurologist regarding the syncopal episodes and dizziness.  She normally has a prodrome of feeling dizzy, lightheaded prior to having a short syncopal episode and is able to recall syncope.  She previously went to PT for dizziness in the past but is not currently going.  No current medications for dizziness.    Today, she reports that she did not recall syncopal episode until she was braiding her hair this evening and she felt a scab on the back of her head then she recalled that she had a syncopal episode earlier at 1100 today. She recalls that she was in the kitchen when she felt dizzy while leaning against counter and slid down the lower cabinet in the kitchen scratching back of head on countertop. She reports that she has been feeling dizzy, nauseous all day following syncope. Normally she reports that she returns to baseline following a syncopal event. She normally does not require a cane or walker for assistance but reports that she has been feeling off balance requiring her to use a walker.  She normally has dizziness with position changes due to her vestibular dysfunction but reports that this feels worse than usual.  No dizziness at rest but currently complaining of HA and nausea. Family bedside denies any confusion, AMS.  Denies chest pain, shortness of breath, recent medicine changes.    Head Injury      Prior to Admission medications  Medication Sig Start Date End Date  Taking? Authorizing Provider  acetaminophen  (TYLENOL ) 500 MG tablet Take 500 mg by mouth as needed for mild pain or moderate pain.    [provider]  albuterol  (VENTOLIN  HFA) 108 (90 Base) MCG/ACT inhaler Inhale 1 puff into the lungs every 4 (four) hours as needed. 11/15/24   Marinda Rocky SAILOR, MD  aspirin  EC 81 MG tablet Take 1 tablet (81 mg total) by mouth daily. Swallow whole. 08/07/22   Cheryle Page, MD  budesonide  (PULMICORT ) 0.5 MG/2ML nebulizer solution Take 2 mLs (0.5 mg total) by nebulization 2 (two) times daily. During respiratory flares for at least 2 weeks or until symptoms resolve. 09/24/22   Marinda Rocky SAILOR, MD  budesonide -formoterol  (SYMBICORT ) 160-4.5 MCG/ACT inhaler Inhale 2 puffs into the lungs 2 (two) times daily. 11/15/24   Marinda Rocky SAILOR, MD  co-enzyme Q-10 30 MG capsule Take 30 mg by mouth 3 (three) times daily.    [provider]  Continuous Glucose Sensor (FREESTYLE LIBRE 3 PLUS SENSOR) MISC Change sensor every 15 days. 01/20/24   Early, Sara E, NP  diphenhydrAMINE  (BENADRYL ) 50 MG capsule Take one capsule 1 hour prior to scan. 09/06/24   Campbell, Kenzie E, NP  Evolocumab  (REPATHA  SURECLICK) 140 MG/ML SOAJ Inject 140 mg into the skin every 14 (fourteen) days. 04/14/23   Walker, Caitlin S, NP  ibuprofen  (ADVIL ) 200 MG tablet Take 3 tablets (600 mg total) by mouth every 8 (eight) hours as needed for mild pain (1-3). 09/21/24  ketoconazole  (NIZORAL ) 2 % cream APPLY 1 APPLICATION TOPICALLY 2 (TWO) TIMES DAILY. TO AFFECTED AREAS. 07/03/23   Early, Sara E, NP  levocetirizine (XYZAL ) 5 MG tablet Take 1 tablet (5 mg total) by mouth every evening. 11/15/24   Marinda Rocky SAILOR, MD  Magnesium  100 MG CAPS Take 1 capsule by mouth at bedtime.    [provider]  MELATONIN PO Take 1 capsule by mouth at bedtime.    [provider]  meloxicam  (MOBIC ) 15 MG tablet Take 1 tablet (15 mg total) by mouth daily as needed for pain. Patient not taking: Reported on 11/15/2024  08/09/24   Jha, Panav, MD  mometasone  (NASONEX ) 50 MCG/ACT nasal spray Place 2 sprays into the nose daily. 11/15/24   Marinda Rocky SAILOR, MD  montelukast  (SINGULAIR ) 10 MG tablet Take 1 tablet (10 mg total) by mouth at bedtime. 11/15/24   Marinda Rocky SAILOR, MD  Multiple Vitamin (MULTIVITAMIN WITH MINERALS) TABS tablet Take 1 tablet by mouth daily.    [provider]  nitroGLYCERIN  (NITROSTAT ) 0.4 MG SL tablet Place 1 tablet (0.4 mg total) under the tongue every 5 (five) minutes as needed for chest pain. 09/06/24   Campbell, Kenzie E, NP  Omega-3 Fatty Acids (FISH OIL) 300 MG CAPS Take by mouth.    [provider]  Polyethyl Glycol-Propyl Glycol (SYSTANE FREE OP) Apply 2 drops to eye daily as needed (both eyes as needed).    [provider]  rosuvastatin  (CRESTOR ) 5 MG tablet Take 1 tablet (5 mg total) by mouth 3 (three) times a week. Every Monday, Wednesday, & Friday 09/27/24   Raford Riggs, MD  Semaglutide , 1 MG/DOSE, (OZEMPIC , 1 MG/DOSE,) 4 MG/3ML SOPN Inject 1 mg as directed once a week. 09/23/24   Early, Sara E, NP  Vitamin D , Ergocalciferol , (DRISDOL ) 1.25 MG (50000 UNIT) CAPS capsule Take 1 capsule (50,000 Units total) by mouth every 7 (seven) days. 01/20/24   Early, Sara E, NP    Allergies: Iodine, Ivp dye [iodinated contrast media], Amoxicillin, Gadobenate, Latex, and Penicillins    Review of Systems  Neurological:  Positive for dizziness.    Updated Vital Signs BP 138/60 (BP Location: Left Arm)   Pulse 63   Temp 99 F (37.2 C) (Oral)   Resp 18   Ht 5' (1.524 m)   Wt 68 kg   SpO2 99%   BMI 29.29 kg/m   Physical Exam Vitals and nursing note reviewed.  Constitutional:      General: She is not in acute distress.    Appearance: Normal appearance. She is not diaphoretic.  HENT:     Head: Normocephalic and atraumatic.      Comments: No hematoma nor TTP of cranium. No crepitus to facial bones    Right Ear: External ear normal. No hemotympanum.     Left Ear:  External ear normal. No hemotympanum.     Nose: Nose normal.     Right Nostril: No epistaxis or septal hematoma.     Left Nostril: No epistaxis or septal hematoma.     Mouth/Throat:     Mouth: Mucous membranes are moist. No injury or lacerations.  Eyes:     General: Lids are normal. Vision grossly intact. No visual field deficit.       Right eye: No discharge.        Left eye: No discharge.     Extraocular Movements: Extraocular movements intact.     Right eye: Normal extraocular motion and no nystagmus.  Left eye: Normal extraocular motion and no nystagmus.     Conjunctiva/sclera: Conjunctivae normal.     Pupils: Pupils are equal, round, and reactive to light.     Comments: No subconjunctival hemorrhage, hyphema, tear drop pupil, or fluid leakage bilaterally  Neck:     Vascular: No carotid bruit.  Cardiovascular:     Rate and Rhythm: Normal rate.     Pulses: Normal pulses.          Radial pulses are 2+ on the right side and 2+ on the left side.       Dorsalis pedis pulses are 2+ on the right side and 2+ on the left side.  Pulmonary:     Effort: Pulmonary effort is normal. No respiratory distress.     Breath sounds: Normal breath sounds. No wheezing.  Chest:     Chest wall: No tenderness.  Abdominal:     General: Bowel sounds are normal. There is no distension.     Palpations: Abdomen is soft.     Tenderness: There is no abdominal tenderness. There is no guarding or rebound.  Musculoskeletal:     Cervical back: Full passive range of motion without pain, normal range of motion and neck supple. No deformity, rigidity or bony tenderness. Normal range of motion.     Thoracic back: No deformity or bony tenderness. Normal range of motion.     Lumbar back: No deformity or bony tenderness. Normal range of motion.     Right hip: No bony tenderness or crepitus.     Left hip: No bony tenderness or crepitus.     Right lower leg: No edema.     Left lower leg: No edema.     Comments: No  obvious deformity to joints or long bones Pelvis stable with no shortening or rotation of LE bilaterally  Skin:    General: Skin is warm and dry.     Capillary Refill: Capillary refill takes less than 2 seconds.     Coloration: Skin is not jaundiced or pale.     Comments: No ecchymosis to chest, abdomen, back  Neurological:     General: No focal deficit present.     Mental Status: She is alert and oriented to person, place, and time. Mental status is at baseline.     GCS: GCS eye subscore is 4. GCS verbal subscore is 5. GCS motor subscore is 6.     Cranial Nerves: Cranial nerves 2-12 are intact. No cranial nerve deficit, dysarthria or facial asymmetry.     Sensory: Sensation is intact. No sensory deficit.     Motor: Motor function is intact. No weakness, tremor, atrophy, abnormal muscle tone, seizure activity or pronator drift.     Coordination: Coordination is intact. Coordination normal. Finger-Nose-Finger Test and Heel to [facility] Test normal.     Gait: Gait is intact. Gait normal.     Deep Tendon Reflexes: Reflexes are normal and symmetric. Reflexes normal.     Comments: Dizziness with position changes. No dizziness at rest.  No speech or visual deficits.  No agnosia.  Motor 5/5 and sensation 2/2 BUE and BLE.     (all labs ordered are listed, but only abnormal results are displayed) Labs Reviewed  CBC WITH DIFFERENTIAL/PLATELET - Abnormal; Notable for the following components:      Result Value   MCV 79.5 (*)    All other components within normal limits  COMPREHENSIVE METABOLIC PANEL WITH GFR - Abnormal; Notable for the following components:  Glucose, Bld 148 (*)    All other components within normal limits  URINALYSIS, ROUTINE W REFLEX MICROSCOPIC - Abnormal; Notable for the following components:   APPearance HAZY (*)    Ketones, ur 5 (*)    All other components within normal limits  TROPONIN T, HIGH SENSITIVITY  TROPONIN T, HIGH SENSITIVITY    EKG: EKG  Interpretation Date/Time:  Friday November 19 2024 19:29:37 EST Ventricular Rate:  83 PR Interval:  134 QRS Duration:  87 QT Interval:  366 QTC Calculation: 430 R Axis:   51  Text Interpretation: Sinus rhythm Baseline wander in lead(s) I V1 since last tracing no significant change Confirmed by Lenor Hollering [phone]) on 11/19/2024 11:57:33 PM  Radiology: [facility] Chest 2 View Result Date: 11/19/2024 EXAM: 2 VIEW(S) XRAY OF THE CHEST 11/19/2024 08:08:11 PM COMPARISON: 07/05/2024 CLINICAL HISTORY: syncope FINDINGS: LUNGS AND PLEURA: No focal pulmonary opacity. No pleural effusion. No pneumothorax. HEART AND MEDIASTINUM: No acute abnormality of the cardiac and mediastinal silhouettes. BONES AND SOFT TISSUES: Multilevel bridging enthesopathy of thoracic spine. IMPRESSION: 1. No acute findings. Electronically signed by: Morgane Naveau MD MD 11/19/2024 08:15 PM EST RP Workstation: [id number]   CT Cervical Spine Wo Contrast Result Date: 11/19/2024 CLINICAL DATA:  Syncopal episode. EXAM: CT CERVICAL SPINE WITHOUT CONTRAST TECHNIQUE: Multidetector CT imaging of the cervical spine was performed without intravenous contrast. Multiplanar CT image reconstructions were also generated. RADIATION DOSE REDUCTION: This exam was performed according to the departmental dose-optimization program which includes automated exposure control, adjustment of the mA and/or kV according to patient size and/or use of iterative reconstruction technique. COMPARISON:  None Available. FINDINGS: Alignment: There is approximately 1 mm retrolisthesis of the C4 vertebral body on C5. Skull base and vertebrae: No acute fracture. No primary bone lesion or focal pathologic process. Soft tissues and spinal canal: No prevertebral fluid or swelling. No visible canal hematoma. Disc levels: Mild-to-moderate severity multilevel endplate sclerosis is seen with mild anterior osteophyte formation at the level of C4-C5 and moderate to marked severity anterior  osteophyte formation at C5-C6, C6-C7 and C7-T1. Mild multilevel intervertebral disc space narrowing is seen, slightly more prominent at the levels of C5-C6 and C6-C7. Bilateral moderate to marked severity multilevel facet joint hypertrophy is noted. Upper chest: Negative. Other: None. IMPRESSION: 1. No acute fracture or traumatic subluxation of the cervical spine. 2. Mild-to-moderate severity multilevel degenerative changes, as described above. Electronically Signed   By: Suzen Dials M.D.   On: 11/19/2024 19:59   CT Head Wo Contrast Result Date: 11/19/2024 CLINICAL DATA:  Syncopal episode. EXAM: CT HEAD WITHOUT CONTRAST TECHNIQUE: Contiguous axial images were obtained from the base of the skull through the vertex without intravenous contrast. RADIATION DOSE REDUCTION: This exam was performed according to the departmental dose-optimization program which includes automated exposure control, adjustment of the mA and/or kV according to patient size and/or use of iterative reconstruction technique. COMPARISON:  August 25, 2023 FINDINGS: Brain: There is generalized cerebral atrophy with widening of the extra-axial spaces and ventricular dilatation. There are areas of decreased attenuation within the white matter tracts of the supratentorial brain, consistent with microvascular disease changes. Vascular: No hyperdense vessel or unexpected calcification. Skull: Normal. Negative for fracture or focal lesion. Sinuses/Orbits: No acute finding. Other: None. IMPRESSION: 1. Generalized cerebral atrophy and microvascular disease changes of the supratentorial brain. 2. No acute intracranial abnormality. Electronically Signed   By: Suzen Dials M.D.   On: 11/19/2024 19:57    Medications Ordered in the ED  ketorolac  (  TORADOL ) 15 MG/ML injection 15 mg (has no administration in time range)  meclizine  (ANTIVERT ) tablet 25 mg (has no administration in time range)  ondansetron  (ZOFRAN -ODT) disintegrating tablet 4 mg  (has no administration in time range)                                   Medical Decision Making Amount and/or Complexity of Data Reviewed Labs: ordered.  Risk Prescription drug management. Decision regarding hospitalization.   Patient presents to the ED for concern of syncope, dizziness, this involves an extensive number of treatment options, and is a complaint that carries with it a high risk of complications and morbidity.  The differential diagnosis includes electrolyte abnormality, ICH, traumatic injury, vertigo, CVA/TIA, UTI, infection   Co morbidities that complicate the patient evaluation  Vestibular dysfunction Prior syncopal episodes over past 2 years   Additional history obtained:  Additional history obtained from Family, Nursing, and Outside Medical Records   External records from outside source obtained and reviewed including triage note, family bedside   Lab Tests:  I Ordered, and personally interpreted labs.  The pertinent results include:   CBG 146   Imaging Studies ordered:  I ordered imaging studies including chest x-ray, CT head, cervical spine I independently visualized and interpreted imaging which showed  No acute abnormalities See individual imaging for specific impression I agree with the radiologist interpretation   Cardiac Monitoring:  The patient was maintained on a cardiac monitor.  I personally viewed and interpreted the cardiac monitored which showed an underlying rhythm of: NSR with no ischemic changes   Medicines ordered and prescription drug management:  I ordered medication including toradol , meclizine , zofran   for HA, dizziness, nausea Reevaluation of the patient after these medicines showed that the patient improved I have reviewed the patients home medicines and have made adjustments as needed    Consultations Obtained:  I requested consultation with the hospitalist,  and discussed lab and imaging findings as well as  pertinent plan - they recommend:  Dr. Franky accepts patient for admission   Problem List / ED Course:  Syncope Presents hemodynamically stable with no tachycardia no hypotension CBG 146 Hgb WNL acute electrolyte abnormalities Chest x-ray without pneumonia Did obtain troponin as patient had absolutely no prodrome and did not recall syncopal episode.  Never had complaints of chest pain, shortness of breath today.  Fortunately, this is negative.  EKG without ischemic changes Patient does have a superficial 2 cm laceration to posterior cranium.  No active hemorrhage.  No other signs of trauma on physical exam.  No chest wall tenderness nor abdominal tenderness.  Low suspicion for intra-abdominal, intrathoracic traumatic injury Patient is currently neurologically intact.  No speech nor visual deficits.  She does endorse dizziness with position changes per her baseline. No seizure hx Seen by cardiology on 09/06/2024 for syncope.  She had a Holter monitor which was negative for arrhythmias.  Echo from 10/05/2024 notable for LVEF 60 to 65% with grade 1 diastolic dysfunction.  Also had CT of coronary vessels that were similar to CTA obtained in 2023 and unlikely to be etiology of syncopal episodes She is also followed by neurology and last saw them 08/31/24. They note back to 2019 where patient had symptoms of dizziness, room spinning sensation, feels of being off balance and had MRI in 2019 at that time that was negative for stroke. Most recently had MRI brain in 2024  when she was having similar symptoms and syncopal episodes that were negative for acute pathology or stroke. Recommended sleep medicine recommendation to ensure OSA is not contributing to fatigue, dizziness, syncope Tried meclizine  in 2023 with no improvement so did proceed to PT.  Will provide a dose of meclizine  here to see if this improves dizziness Provided Zofran  for nausea and Toradol  for headache. Concerned as patient reports  this is different from syncopal episodes in past. There was no prodrome.  The patient would benefit from admission for observation in setting of syncope   Reevaluation:  After the interventions noted above, I reevaluated the patient and found that they have :improved    Dispostion:  After consideration of the diagnostic results and the patients response to treatment, I feel that the patent would benefit from admission for observation for syncope wo prodrome.   Discussed ED workup, disposition patient and patient's family at bedside expressed understanding of plan.  All questions answered.  Final diagnoses:  Injury of head, initial encounter  Syncope and collapse  Dizziness    ED Discharge Orders     None        Minnie Tinnie BRAVO, [place] 11/20/24 0047  "

## 2024-11-19 NOTE — Telephone Encounter (Signed)
 Pharmacy Patient Advocate Encounter   Received notification from Pt Calls Messages that prior authorization for FreeStyle Libre 3 Plus Sensor  is required/requested.   Insurance verification completed.   The patient is insured through [employer].   Per test claim: PA required; PA submitted to above mentioned insurance via Latent Key/confirmation #/EOC [facility] Status is pending

## 2024-11-20 ENCOUNTER — Encounter (HOSPITAL_COMMUNITY): Payer: Self-pay | Admitting: Internal Medicine

## 2024-11-20 DIAGNOSIS — J45909 Unspecified asthma, uncomplicated: Secondary | ICD-10-CM | POA: Insufficient documentation

## 2024-11-20 DIAGNOSIS — R55 Syncope and collapse: Secondary | ICD-10-CM | POA: Diagnosis present

## 2024-11-20 DIAGNOSIS — E1149 Type 2 diabetes mellitus with other diabetic neurological complication: Secondary | ICD-10-CM

## 2024-11-20 LAB — CBC WITH DIFFERENTIAL/PLATELET
Abs Immature Granulocytes: 0.01 K/uL (ref 0.00–0.07)
Basophils Absolute: 0 K/uL (ref 0.0–0.1)
Basophils Relative: 0 %
Eosinophils Absolute: 0 K/uL (ref 0.0–0.5)
Eosinophils Relative: 0 %
HCT: 34.8 % — ABNORMAL LOW (ref 36.0–46.0)
Hemoglobin: 11.9 g/dL — ABNORMAL LOW (ref 12.0–15.0)
Immature Granulocytes: 0 %
Lymphocytes Relative: 37 %
Lymphs Abs: 1.8 K/uL (ref 0.7–4.0)
MCH: 26.9 pg (ref 26.0–34.0)
MCHC: 34.2 g/dL (ref 30.0–36.0)
MCV: 78.7 fL — ABNORMAL LOW (ref 80.0–100.0)
Monocytes Absolute: 0.3 K/uL (ref 0.1–1.0)
Monocytes Relative: 7 %
Neutro Abs: 2.7 K/uL (ref 1.7–7.7)
Neutrophils Relative %: 56 %
Platelets: 224 K/uL (ref 150–400)
RBC: 4.42 MIL/uL (ref 3.87–5.11)
RDW: 13.4 % (ref 11.5–15.5)
WBC: 4.9 K/uL (ref 4.0–10.5)
nRBC: 0 % (ref 0.0–0.2)

## 2024-11-20 LAB — HEPATIC FUNCTION PANEL
ALT: 9 U/L (ref 0–44)
AST: 16 U/L (ref 15–41)
Albumin: 3.9 g/dL (ref 3.5–5.0)
Alkaline Phosphatase: 55 U/L (ref 38–126)
Bilirubin, Direct: 0.1 mg/dL (ref 0.0–0.2)
Indirect Bilirubin: 0.1 mg/dL — ABNORMAL LOW (ref 0.3–0.9)
Total Bilirubin: 0.3 mg/dL (ref 0.0–1.2)
Total Protein: 6.4 g/dL — ABNORMAL LOW (ref 6.5–8.1)

## 2024-11-20 LAB — BASIC METABOLIC PANEL WITH GFR
Anion gap: 8 (ref 5–15)
BUN: 13 mg/dL (ref 8–23)
CO2: 27 mmol/L (ref 22–32)
Calcium: 9.5 mg/dL (ref 8.9–10.3)
Chloride: 104 mmol/L (ref 98–111)
Creatinine, Ser: 0.64 mg/dL (ref 0.44–1.00)
GFR, Estimated: >60 mL/min
Glucose, Bld: 156 mg/dL — ABNORMAL HIGH (ref 70–99)
Potassium: 3.9 mmol/L (ref 3.5–5.1)
Sodium: 139 mmol/L (ref 135–145)

## 2024-11-20 LAB — CREATININE, SERUM
Creatinine, Ser: 0.63 mg/dL (ref 0.44–1.00)
GFR, Estimated: >60 mL/min

## 2024-11-20 LAB — GLUCOSE, CAPILLARY
Glucose-Capillary: 114 mg/dL — ABNORMAL HIGH (ref 70–99)
Glucose-Capillary: 154 mg/dL — ABNORMAL HIGH (ref 70–99)

## 2024-11-20 LAB — MAGNESIUM: Magnesium: 2.2 mg/dL (ref 1.7–2.4)

## 2024-11-20 LAB — CBG MONITORING, ED
Glucose-Capillary: 123 mg/dL — ABNORMAL HIGH (ref 70–99)
Glucose-Capillary: 104 mg/dL — ABNORMAL HIGH (ref 70–99)

## 2024-11-20 LAB — TROPONIN T, HIGH SENSITIVITY: Troponin T High Sensitivity: <15 ng/L (ref 0–19)

## 2024-11-20 LAB — TSH: TSH: 0.872 u[IU]/mL (ref 0.350–4.500)

## 2024-11-20 LAB — CORTISOL: Cortisol, Plasma: 10.7 ug/dL

## 2024-11-20 MED ORDER — ASPIRIN 81 MG PO TBEC
81.0000 mg | DELAYED_RELEASE_TABLET | Freq: Every day | ORAL | Status: DC
  Administered 2024-11-20 – 2024-11-21 (×2): 81 mg via ORAL
  Filled 2024-11-20 (×2): qty 1

## 2024-11-20 MED ORDER — INSULIN ASPART 100 UNIT/ML IJ SOLN: 0.0000 [IU] | Freq: Three times a day (TID) | INTRAMUSCULAR | Status: DC

## 2024-11-20 MED ORDER — ACETAMINOPHEN 650 MG RE SUPP: 650.0000 mg | Freq: Four times a day (QID) | RECTAL | Status: DC | PRN

## 2024-11-20 MED ORDER — ENOXAPARIN SODIUM 40 MG/0.4ML IJ SOSY
40.0000 mg | PREFILLED_SYRINGE | INTRAMUSCULAR | Status: DC
  Administered 2024-11-20 – 2024-11-21 (×2): 40 mg via SUBCUTANEOUS
  Filled 2024-11-20 (×2): qty 0.4

## 2024-11-20 MED ORDER — ACETAMINOPHEN 325 MG PO TABS
650.0000 mg | ORAL_TABLET | Freq: Four times a day (QID) | ORAL | Status: DC | PRN
  Administered 2024-11-20 – 2024-11-21 (×2): 650 mg via ORAL
  Filled 2024-11-20 (×2): qty 2

## 2024-11-20 MED ORDER — FLUTICASONE FUROATE-VILANTEROL 100-25 MCG/ACT IN AEPB
1.0000 | INHALATION_SPRAY | Freq: Every day | RESPIRATORY_TRACT | Status: DC
  Administered 2024-11-20 – 2024-11-21 (×2): 1 via RESPIRATORY_TRACT
  Filled 2024-11-20: qty 28

## 2024-11-20 MED ORDER — ROSUVASTATIN CALCIUM 5 MG PO TABS: 5.0000 mg | ORAL_TABLET | ORAL | Status: DC

## 2024-11-20 NOTE — Care Plan (Signed)
 Patient seen and examined personally, I reviewed the chart, history and physical and admission note, done by admitting physician this morning and agree with the same with following addendum.  Please refer to the morning admission note for more detailed plan of care.  Briefly,  74 year old female with T2DM HLD nonobstructive CAD asthma brought to the ER after patient had a syncopal episode.  Patient states on 1/9 around noon time she was planning to walk the dog along in her yard but she stopped shortly because she started feeling dizzy and when she tried to sit on her sofa she next remembers that she is on the floor lying and had a small laceration on her head.  Later in the evening she came to the ER.  Patient has been having recurrent episodes of syncope and had been followed by cardiology.  In October 2025 when patient followed with cardiology they had arranged for event monitor for 1 month which patient states she just finished results of which are not known.  Patient's continuous glucose meter did record a blood sugar of 67 early in the morning around 4 AM  on 1/0 at that time patient was sleeping.  Did not have any further hypoglycemic episodes on the continuous glucose monitoring device. In ED: on exam nonfocal. EKG shows normal sinus rhythm.  Potassium is 3.8 troponins are negative hemoglobin 12.4.  CT head >> unremarkable. While in the ER patient sat up on the bed and became dizzy w/ strong sense of smell, had a brief episode of syncope lasting less than 10 seconds and patient did not have any confusion or seizure-like episode during the episode.  Blood sugar was 137 at that time Patient admitted for further management Vitals stable labs with stable CBC CMP troponin negative x 2 UA unremarkable TSH 0.8  On exam she is alert awake oriented x 3 Earlier had some nausea dizziness before syncope, currently no shortness of breath chest pain fever chills  A/P Recurrent syncope: Patient with some  prodromal dizziness.  Recently has had multiple episodes seen by cardiology and had cardiac monitoring done for a month-results not available.  Cardiology consulted-feels less likely cardiac etiology, home monitor needs to be turning, check random cortisol, checked orthostatics-as below, checking EEG. Ptot consulted Orthostatic VS for the past 24 hrs:  BP- Lying Pulse- Lying BP- Sitting Pulse- Sitting BP- Standing at 0 minutes Pulse- Standing at 0 minutes  11/20/24 0858 117/65 57 144/69 60 128/62 68    Diabetes mellitus type 2 on semaglutide .  Last hemoglobin A1c was 6.5 about 3 months ago. Hyperlipidemia on statins. History of asthma on Symbicort .
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
## 2024-11-20 NOTE — ED Notes (Signed)
 Pt visitor calling out for help. Visitor reports pt had a syncopal episode in her bed. Pt head tilted towards the wall. RN at bedside with pt. Pt reports she feels fine but may have passed out again. MD notified.
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
## 2024-11-20 NOTE — ED Notes (Signed)
 Went to update pt's vitals, but pt is still eating breakfast. Will update once she is done eating.

## 2024-11-20 NOTE — Hospital Course (Addendum)
 Patient seen and examined personally, I reviewed the chart, history and physical and admission note, done by admitting physician this morning and agree with the same with following addendum.  Please refer to the morning admission note for more detailed plan of care.  Briefly,  74 year old female with T2DM HLD nonobstructive CAD asthma brought to the ER after patient had a syncopal episode.  Patient states on 1/9 around noon time she was planning to walk the dog along in her yard but she stopped shortly because she started feeling dizzy and when she tried to sit on her sofa she next remembers that she is on the floor lying and had a small laceration on her head.  Later in the evening she came to the ER.  Patient has been having recurrent episodes of syncope and had been followed by cardiology.  In October 2025 when patient followed with cardiology they had arranged for event monitor for 1 month which patient states she just finished results of which are not known.  Patient's continuous glucose meter did record a blood sugar of 67 early in the morning around 4 AM  on 1/0 at that time patient was sleeping.  Did not have any further hypoglycemic episodes on the continuous glucose monitoring device. In ED: on exam nonfocal. EKG shows normal sinus rhythm.  Potassium is 3.8 troponins are negative hemoglobin 12.4.  CT head >> unremarkable. While in the ER patient sat up on the bed and became dizzy w/ strong sense of smell, had a brief episode of syncope lasting less than 10 seconds and patient did not have any confusion or seizure-like episode during the episode.  Blood sugar was 137 at that time Patient admitted for further management Vitals stable labs with stable CBC CMP troponin negative x 2 UA unremarkable TSH 0.8  On exam she is alert awake oriented x 3 Earlier had some nausea dizziness before syncope, currently no shortness of breath chest pain fever chills  A/P Recurrent syncope: Patient with some  prodromal dizziness.  Recently has had multiple episodes seen by cardiology and had cardiac monitoring done for a month-results not available.  Cardiology consulted-feels less likely cardiac etiology, home monitor needs to be turning, check random cortisol, checked orthostatics-as below, checking EEG. Ptot consulted Orthostatic VS for the past 24 hrs:  BP- Lying Pulse- Lying BP- Sitting Pulse- Sitting BP- Standing at 0 minutes Pulse- Standing at 0 minutes  11/20/24 0858 117/65 57 144/69 60 128/62 68    Diabetes mellitus type 2 on semaglutide .  Last hemoglobin A1c was 6.5 about 3 months ago. Hyperlipidemia on statins. History of asthma on Symbicort .  143/70 (!) 125/58  Pulse: 68 62 63 (!) 55  Resp: 16  16   Temp: 99 F (37.2 C) 99.1 F (37.3 C) 98.3 F (36.8 C) 98.1 F (36.7 C)  TempSrc: Oral Oral Oral Oral  SpO2: 99% 97% 97% 98%  Weight:      Height:        Physical Examination: General exam: alert awake, oriented, older than stated age HEENT:Oral mucosa moist, Ear/Nose WNL grossly Respiratory system:  Bilaterally clear BS,no use of accessory muscle Cardiovascular system: S1 & S2 +, No JVD. Gastrointestinal system: Abdomen soft,NT,ND, BS+ Nervous System: Alert, awake, moving all extremities,and following commands. Extremities: extremities warm, leg edema neg Skin: Warm, no rashes MSK: Normal muscle bulk,tone, power

## 2024-11-20 NOTE — ED Notes (Signed)
 Pt glucose monitor reading is 122, no insulin  needed

## 2024-11-20 NOTE — Evaluation (Signed)
 Physical Therapy Evaluation Patient Details Name: Pam [name] MRN: [id number] DOB: 11/27/1950 Today's Date: 11/20/2024  History of Present Illness  74 y.o. female with history of Covid, diabetes mellitus type 2, hyperlipidemia, nonobstructive CAD, asthma, dizziness and giddiness was brought to the ER after patient had a syncopal episode with loss of consciousness.  Clinical Impression  Pt admitted with above diagnosis.  Pt currently with functional limitations due to the deficits listed below (see PT Problem List). Pt will benefit from acute skilled PT to increase their independence and safety with mobility to allow discharge.  Pt reports history of 3-4 syncopal episodes with loss of consciousness in the past 2 years.  Pt also states she had vestibular PT last year however does not feel current symptoms are similar (especially since currently she complains of strong olfactory component and then syncopal episode).  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  Pt very fearful of falling since she had repeatedly awoken on the floor without knowing what happened (typically when she is home and alone).  Pt has cane she can use upon d/c.  Since pt does not report imbalance and is having syncopal episodes, do not feel RW would be beneficial at this time (may even be an object that causes injury if future loss of consciousness occurs).  Recommend HHPT upon d/c as pt also reports significant weakness since her multiple respiratory illnesses (sick since August).  Pt assisted to/from bathroom during session with HHA for safety/support due to pt's fear of falling.  Pt appears a little anxious with farther distance and requested hallway ambulation next session.         If plan is discharge home, recommend the following: A little help with walking and/or transfers;A little help with bathing/dressing/bathroom;Help with stairs or ramp for  entrance;Assistance with cooking/housework   Can travel by private vehicle        Equipment Recommendations None recommended by PT  Recommendations for Other Services       Functional Status Assessment Patient has had a recent decline in their functional status and demonstrates the ability to make significant improvements in function in a reasonable and predictable amount of time.     Precautions / Restrictions Precautions Precautions: Fall Precaution/Restrictions Comments: due to syncopal episodes      Mobility  Bed Mobility Overal bed mobility: Needs Assistance Bed Mobility: Supine to Sit, Sit to Supine     Supine to sit: Supervision Sit to supine: Supervision        Transfers Overall transfer level: Needs assistance Equipment used: None Transfers: Sit to/from Stand Sit to Stand: Contact guard assist           General transfer comment: CGA for safety, provided HHA for toilet transfers as pt reported nausea and dizziness    Ambulation/Gait Ambulation/Gait assistance: Contact guard assist Gait Distance (Feet): 16 Feet (total) Assistive device: 1 person hand held assist Gait Pattern/deviations: Step-through pattern, Decreased stride length       General Gait Details: CGA for safety with recent syncopal episodes, pt reported dizziness and nausea upon using toilet however improved upon returning to bed, SPO2 91-93% on room air and HR 65-70 bpm  Stairs            Wheelchair Mobility     Tilt Bed    Modified Rankin (Stroke Patients Only)       Balance Overall balance assessment: History of Falls  Pertinent Vitals/Pain Pain Assessment Pain Assessment: No/denies pain    Home Living Family/patient expects to be discharged to:: Private residence Living Arrangements: Alone Available Help at Discharge: Friend(s);Available 24 hours/day Type of Home: Homeless (townhome) Home Access:  Stairs to enter   Secretary/administrator of Steps: 2 Alternate Level Stairs-Number of Steps: flight Home Layout: Two level;Bed/bath upstairs;1/2 bath on main level Home Equipment: Cane - single point;Other (comment) (hiking poles)      Prior Function Prior Level of Function : Independent/Modified Independent             Mobility Comments: no assistive in home, uses cane when out of home       Extremity/Trunk Assessment        Lower Extremity Assessment Lower Extremity Assessment: Generalized weakness       Communication   Communication Communication: No apparent difficulties    Cognition Arousal: Alert Behavior During Therapy: WFL for tasks assessed/performed   PT - Cognitive impairments: No apparent impairments                         Following commands: Intact       Cueing       General Comments      Exercises     Assessment/Plan    PT Assessment Patient needs continued PT services  PT Problem List Decreased strength;Decreased activity tolerance;Decreased knowledge of use of DME;Decreased mobility       PT Treatment Interventions Gait training;DME instruction;Therapeutic exercise;Balance training;Therapeutic activities;Functional mobility training;Patient/family education;Stair training    PT Goals (Current goals can be found in the Care Plan section)  Acute Rehab PT Goals PT Goal Formulation: With patient Time For Goal Achievement: 12/04/24 Potential to Achieve Goals: Good    Frequency Min 3X/week     Co-evaluation               AM-PAC PT 6 Clicks Mobility  Outcome Measure Help needed turning from your back to your side while in a flat bed without using bedrails?: A Little Help needed moving from lying on your back to sitting on the side of a flat bed without using bedrails?: A Little Help needed moving to and from a bed to a chair (including a wheelchair)?: A Little Help needed standing up from a chair using your arms  (e.g., wheelchair or bedside chair)?: A Little Help needed to walk in hospital room?: A Little Help needed climbing 3-5 steps with a railing? : A Little 6 Click Score: 18    End of Session Equipment Utilized During Treatment: Gait belt Activity Tolerance: Patient tolerated treatment well Patient left: in bed;with call bell/phone within reach;with bed alarm set;with family/visitor present   PT Visit Diagnosis: Difficulty in walking, not elsewhere classified (R26.2)    Time: 1555-1630 PT Time Calculation (min) (ACUTE ONLY): 35 min   Charges:   PT Evaluation $PT Eval Low Complexity: 1 Low PT Treatments $Gait Training: 8-22 mins PT General Charges $$ ACUTE PT VISIT: 1 Visit        Tari KLEIN, DPT Physical Therapist Acute Rehabilitation Services Office: [phone]   Tari CROME [place]ayson 11/20/2024, 4:45 PM

## 2024-11-21 LAB — GLUCOSE, CAPILLARY
Glucose-Capillary: 96 mg/dL (ref 70–99)
Glucose-Capillary: 103 mg/dL — ABNORMAL HIGH (ref 70–99)

## 2024-11-21 MED ORDER — IBUPROFEN 400 MG PO TABS
400.0000 mg | ORAL_TABLET | Freq: Four times a day (QID) | ORAL | Status: DC | PRN
  Administered 2024-11-21: 400 mg via ORAL
  Filled 2024-11-21: qty 1

## 2024-11-21 MED ORDER — ONDANSETRON HCL 4 MG/2ML IJ SOLN
4.0000 mg | Freq: Four times a day (QID) | INTRAMUSCULAR | Status: DC | PRN
  Administered 2024-11-21: 4 mg via INTRAVENOUS
  Filled 2024-11-21 (×2): qty 2

## 2024-11-21 NOTE — Progress Notes (Signed)
 Physical Therapy Treatment Patient Details Name: Pam [name] MRN: [id number] DOB: 1951/06/20 Today's Date: 11/21/2024   History of Present Illness 74 y.o. female with history of Covid, diabetes mellitus type 2, hyperlipidemia, nonobstructive CAD, asthma, dizziness and giddiness was brought to the ER after patient had a syncopal episode with loss of consciousness.    PT Comments  Pt anticipates d/c home today however very eager to ambulate in hallway prior to d/c.  Since pt reports changes in smell and vision as well as needing EEG, pt was encouraged to f/u with neurology MD appointment upon d/c.  Pt reports she stopped driving in 7976 with vision changes and syncopal episodes.  Pt very agreeable to HHPT.  Pt plans to use SPC in home initially upon d/c and has been provided with gait belt.  Pt repeated stories/information she had discussed with this therapist yesterday and also repeated the same questions today.  Pt does state she has noticed changes in her memory.  Pt encouraged to keep notebook of her symptoms to bring to MD appointments.   If plan is discharge home, recommend the following: A little help with walking and/or transfers;A little help with bathing/dressing/bathroom;Help with stairs or ramp for entrance;Assistance with cooking/housework   Can travel by private vehicle        Equipment Recommendations  None recommended by PT    Recommendations for Other Services       Precautions / Restrictions Precautions Precautions: Fall Precaution/Restrictions Comments: due to syncopal episodes     Mobility  Bed Mobility Overal bed mobility: Needs Assistance Bed Mobility: Supine to Sit     Supine to sit: Supervision          Transfers Overall transfer level: Needs assistance Equipment used: None Transfers: Sit to/from Stand Sit to Stand: Contact guard assist           General transfer comment: CGA for safety    Ambulation/Gait Ambulation/Gait assistance: Contact  guard assist Gait Distance (Feet): 350 Feet Assistive device: Straight cane Gait Pattern/deviations: Step-through pattern, Decreased stride length, Narrow base of support Gait velocity: decr     General Gait Details: CGA for safety with recent syncopal episodes, pt reported intermittent dizziness with standing rest breaks and she reports her PCP told her to just sit down wherever if she feels bad, SPO2 95% on room air upon return to recliner   Stairs             Wheelchair Mobility     Tilt Bed    Modified Rankin (Stroke Patients Only)       Balance Overall balance assessment: History of Falls                                          Communication Communication Communication: No apparent difficulties  Cognition Arousal: Alert Behavior During Therapy: WFL for tasks assessed/performed   PT - Cognitive impairments: Memory                       PT - Cognition Comments: observed impairment of short term memory today which pt also agrees with Following commands: Intact      Cueing    Exercises      General Comments        Pertinent Vitals/Pain Pain Assessment Pain Assessment: No/denies pain    Home Living  Prior Function            PT Goals (current goals can now be found in the care plan section) Progress towards PT goals: Progressing toward goals    Frequency    Min 3X/week      PT Plan      Co-evaluation              AM-PAC PT 6 Clicks Mobility   Outcome Measure  Help needed turning from your back to your side while in a flat bed without using bedrails?: A Little Help needed moving from lying on your back to sitting on the side of a flat bed without using bedrails?: A Little Help needed moving to and from a bed to a chair (including a wheelchair)?: A Little Help needed standing up from a chair using your arms (e.g., wheelchair or bedside chair)?: A Little Help needed  to walk in hospital room?: A Little Help needed climbing 3-5 steps with a railing? : A Little 6 Click Score: 18    End of Session Equipment Utilized During Treatment: Gait belt Activity Tolerance: Patient tolerated treatment well Patient left: in chair;with chair alarm set;with call bell/phone within reach   PT Visit Diagnosis: Difficulty in walking, not elsewhere classified (R26.2)     Time: 8769-8741 PT Time Calculation (min) (ACUTE ONLY): 28 min  Charges:    $Gait Training: 23-37 mins PT General Charges $$ ACUTE PT VISIT: 1 Visit                     Tari KLEIN, DPT Physical Therapist Acute Rehabilitation Services Office: [phone]  Tari CROME [place] 11/21/2024, 1:35 PM

## 2024-11-21 NOTE — Evaluation (Signed)
 Occupational Therapy Evaluation Patient Details Name: Pam [name] MRN: [id number] DOB: Oct 13, 1951 Today's Date: 11/21/2024   History of Present Illness   74 y.o. female brought to the ER after patient had a syncopal episode with loss of consciousness.PMH: Covid, diabetes mellitus type 2, hyperlipidemia, nonobstructive CAD, asthma, dizziness and giddiness.     Clinical Impressions At baseline, pt lives alone independently and endorses increase in falls and syncopal episodes. Educated pt on home safety, strategies to reduce risk of falls and management of syncope, including home set up, use of compression socks and using counter pressure exercises. Pt repetitive at times during setting  -states she has had increased difficulty with memory. Feel HHOT would be beneficial to maximize pt's level of independence in the home given generalized weakness with recent respiratory illnesses, syncope and recent falls.      If plan is discharge home, recommend the following:   A little help with walking and/or transfers;A little help with bathing/dressing/bathroom;Assistance with cooking/housework;Assist for transportation     Functional Status Assessment   Patient has had a recent decline in their functional status and demonstrates the ability to make significant improvements in function in a reasonable and predictable amount of time.     Equipment Recommendations   Tub/shower seat     Recommendations for Other Services         Precautions/Restrictions   Precautions Precautions: Fall Precaution/Restrictions Comments: due to syncopal episodes Restrictions Weight Bearing Restrictions Per Provider Order: No     Mobility Bed Mobility               General bed mobility comments: OOB in chair    Transfers Overall transfer level: Needs assistance Equipment used: None Transfers: Sit to/from Stand                    Balance Overall balance assessment: History of  Falls                                         ADL either performed or assessed with clinical judgement   ADL Overall ADL's : Needs assistance/impaired                                     Functional mobility during ADLs: Supervision/safety General ADL Comments: close to baseline; easily fatigues with activity; recommend use of shouer chair and to only shower initially when someone is in the house; educated o home modifications, including moving items to countertop height to reduce the need for overhead reaching and bending over to reduce risk of falls; pt has fall alert on her watch; rec support hose - knee high     Vision Baseline Vision/History: 1 Wears glasses Additional Comments: hx of double vision - has prisms in lens; reports possible changes in vision - rec follow up with eye doctor     Perception         Praxis         Pertinent Vitals/Pain       Extremity/Trunk Assessment Upper Extremity Assessment Upper Extremity Assessment: Generalized weakness   Lower Extremity Assessment Lower Extremity Assessment: Defer to PT evaluation   Cervical / Trunk Assessment Cervical / Trunk Assessment: Normal   Communication Communication Communication: No apparent difficulties   Cognition Arousal: Alert Behavior During Therapy: [facility] for tasks  assessed/performed (emotional at times) Cognition: No family/caregiver present to determine baseline (reports having incresed difficulty with memory; repeated herself several times throughout session)                               Following commands: Intact       Cueing  General Comments          Exercises Exercises: Other exercises Other Exercises Other Exercises: counter pressure exercisees for syncope Other Exercises: strategies to reduce risk of falls   Shoulder Instructions      Home Living Family/patient expects to be discharged to:: Private residence Living Arrangements:  Alone Available Help at Discharge: Friend(s);Available 24 hours/day Type of Home: Homeless (townhome) Home Access: Stairs to enter Secretary/administrator of Steps: 2   Home Layout: Two level;Bed/bath upstairs;1/2 bath on main level Alternate Level Stairs-Number of Steps: flight Alternate Level Stairs-Rails: Right Bathroom Shower/Tub: Chief Strategy Officer: Standard Bathroom Accessibility: Yes   Home Equipment: Cane - single point;Other (comment);Grab bars - tub/shower (hiking poles)          Prior Functioning/Environment Prior Level of Function : Independent/Modified Independent             Mobility Comments: no assistive in home, uses cane when out of home      OT Problem List: Decreased activity tolerance;Decreased strength;Decreased safety awareness;Decreased knowledge of use of DME or AE;Cardiopulmonary status limiting activity   OT Treatment/Interventions:        OT Goals(Current goals can be found in the care plan section)   Acute Rehab OT Goals Patient Stated Goal: learn to accept help OT Goal Formulation: All assessment and education complete, DC therapy   OT Frequency:       Co-evaluation              AM-PAC OT 6 Clicks Daily Activity     Outcome Measure Help from another person eating meals?: None Help from another person taking care of personal grooming?: None Help from another person toileting, which includes using toliet, bedpan, or urinal?: A Little Help from another person bathing (including washing, rinsing, drying)?: A Little Help from another person to put on and taking off regular upper body clothing?: A Little Help from another person to put on and taking off regular lower body clothing?: A Little 6 Click Score: 20   End of Session Nurse Communication: Other (comment) (DC needs)  Activity Tolerance: Patient tolerated treatment well Patient left: in chair;with call bell/phone within reach  OT Visit Diagnosis:  Unsteadiness on feet (R26.81);Repeated falls (R29.6)                Time: 1430-1500 OT Time Calculation (min): 30 min Charges:  OT General Charges $OT Visit: 1 Visit OT Evaluation $OT Eval Low Complexity: 1 Low  Raygen Dahm, OT/L   Acute OT Clinical Specialist Acute Rehabilitation Services Pager [phone] Office [phone]   [facility] 11/21/2024, 3:26 PM

## 2024-11-21 NOTE — Care Management Obs Status (Signed)
 MEDICARE OBSERVATION STATUS NOTIFICATION   Patient Details  Name: Pam [name] MRN: [id number] Date of Birth: 1951/04/11   Medicare Observation Status Notification Given:  Yes    Pam [name] LILLETTE Fenton, LCSW 11/21/2024, 10:36 AM

## 2024-11-21 NOTE — TOC Progression Note (Signed)
 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  She is declining HHPT at this time- her support system would prefer to step in more with her activity etc.  [name] and I spoke at length  About her life and recent changes as she is moving through the aging cycle. She was appreciative of the support system , but has always been independent so as independence is changing it is new for her. She will pursue HHPT with PCP should there be a need.  ICM signing off.     Barriers to Discharge: No Barriers Identified               Expected Discharge Plan and Services         Expected Discharge Date: 11/21/24                                     Social Drivers of Health (SDOH) Interventions SDOH Screenings   Food Insecurity: No Food Insecurity (11/20/2024)  Housing: Low Risk (11/20/2024)  Transportation Needs: No Transportation Needs (11/20/2024)  Utilities: Not At Risk (11/20/2024)  Alcohol Screen: Low Risk (08/27/2024)  Depression (PHQ2-9): Low Risk (10/04/2024)  Financial Resource Strain: Low Risk (08/27/2024)  Physical Activity: Insufficiently Active (06/15/2024)  Social Connections: Moderately Integrated (11/20/2024)  Recent Concern: Social Connections - Moderately Isolated (08/27/2024)  Stress: Stress Concern Present (08/27/2024)  Tobacco Use: Medium Risk (11/20/2024)  Health Literacy: Adequate Health Literacy (02/17/2024)    Readmission Risk Interventions     No data to display

## 2024-11-21 NOTE — Discharge Summary (Signed)
 Physician Discharge Summary  [name] FMW:[id number] DOB: 09-18-1951 DOA: 11/19/2024  PCP: Oris [name] BRAVO, NP  Admit date: 11/19/2024 Discharge date: 11/21/2024 Recommendations for Outpatient Follow-up:  Follow up with PCP in 1 weeks-call for appointment Please obtain BMP/CBC in one week Follow-up with neurology, pulmonology Follow-up with cardiology  Discharge Dispo: home w/ hh Discharge Condition: Stable Code Status:   Code Status: Full Code Diet recommendation:  Diet Order             Diet heart healthy/carb modified Room service appropriate? Yes; Fluid consistency: Thin  Diet effective now                    Brief/Interim Summary: Pam [name] is a 74 y.o. female with PMH of T2DM HLD nonobstructive CAD asthma brought to the ER after patient had a syncopal episode.  Patient states on 1/9 around noon time she was planning to walk the dog along in her yard but she stopped shortly because she started feeling dizzy and when she tried to sit on her sofa she next remembers that she is on the floor lying and had a small laceration on her head.  Later in the evening she came to the ER.  Patient has been having recurrent episodes of syncope and had been followed by cardiology.  In October 2025 when patient followed with cardiology they had arranged for event monitor for 1 month which patient states she just finished results of which are not known.  Patient's continuous glucose meter did record a blood sugar of 67 early in the morning around 4 AM  on 1/0 at that time patient was sleeping.  Did not have any further hypoglycemic episodes on the continuous glucose monitoring device. In ED: on exam nonfocal. EKG shows normal sinus rhythm.  Potassium is 3.8 troponins are negative hemoglobin 12.4.  CT head >> unremarkable. While in the ER patient sat up on the bed and became dizzy w/ strong sense of smell, had a brief episode of syncope lasting less than 10 seconds and patient did not have any  confusion or seizure-like episode during the episode.  Blood sugar was 137 at that time Patient admitted for further management Vitals stable labs with stable CBC CMP troponin negative x 2 UA unremarkable TSH 0.8 Orthostatic vitals negative today, mild headache otherwise no complaint no syncope recurrence, she is eager to go home today. She does have a neurology advise outpatient follow-up for possible outpatient EEG which she is agreeable-as EEG not available on the weekend  Subjective: Seen and examined today Eager to go home Overnight vitals stable afebrile on room air, Labs stable blood sugar serum cortisol adequate 10.7  Discharge Diagnoses:   Recurrent syncope: Patient with some prodromal dizziness.  Recently has had multiple episodes seen by cardiology and had cardiac monitoring done for a month-results not available.  Cardiology consulted-feels less likely cardiac etiology, home monitor needs to be turned in stable random cortisol, TSH 0.8 normal no evidence of UTI, orthostatic vitals stable PT OT has evaluated and no further need. EEG was ordered but not available doubt atrial LNE results, no signs or symptoms of seizure -follow-up with neurology as outpatient Patient does have upcoming follow-up with neurology , sleep apnea evaluation and ophthalmology evaluation.  T2DM: Pta on semaglutide .  Last hemoglobin A1c was 6.5 about 3 months ago.  Blood sugar stable-at times close he does have continuous blood sugar monitoring system at home, advised to discuss with PCP if he has recurrence  of low blood sugar  Hyperlipidemia Continue  statins.  History of asthma: Stable on Symbicort .  Mobility: PT Orders: Active PT Follow up Rec: Home Health Pt1/08/2025 1641   DVT prophylaxis: enoxaparin  (LOVENOX ) injection 40 mg Start: 11/20/24 1000 Code Status:   Code Status: Full Code Family Communication: plan of care discussed with patient at bedside. Patient status is: Remains hospitalized  because of severity of illness Level of care: Telemetry   Dispo: The patient is from: home            Anticipated disposition: Home with home health Objective: Vitals last 24 hrs: Vitals:   11/20/24 1319 11/20/24 1728 11/20/24 2137 11/21/24 0518  BP: (!) 155/69 (!) 132/57 (!) 143/70 (!) 125/58  Pulse: 68 62 63 (!) 55  Resp: 16  16   Temp: 99 F (37.2 C) 99.1 F (37.3 C) 98.3 F (36.8 C) 98.1 F (36.7 C)  TempSrc: Oral Oral Oral Oral  SpO2: 99% 97% 97% 98%  Weight:      Height:        Physical Examination: General exam: alert awake, oriented, older than stated age HEENT:Oral mucosa moist, Ear/Nose WNL grossly Respiratory system: Bilaterally clear BS,no use of accessory muscle Cardiovascular system: S1 & S2 +, No JVD. Gastrointestinal system: Abdomen soft,NT,ND, BS+ Nervous System: Alert, awake, moving all extremities,and following commands. Extremities: extremities warm, leg edema neg Skin: Warm, no rashes MSK: Normal muscle bulk,tone, power      Consultation: See note.  Discharge Instructions  Discharge Instructions     Discharge instructions   Complete by: As directed    Please call call MD or return to ER for similar or worsening recurring problem that brought you to hospital or if any fever,nausea/vomiting,abdominal pain, uncontrolled pain, chest pain,  shortness of breath or any other alarming symptoms.  Please follow-up your doctor as instructed in a week time and call the office for appointment.  Please avoid alcohol, smoking, or any other illicit substance and maintain healthy habits including taking your regular medications as prescribed.  You were cared for by a hospitalist during your hospital stay. If you have any questions about your discharge medications or the care you received while you were in the hospital after you are discharged, you can call the unit and ask to speak with the hospitalist on call if the hospitalist that took care of you is not  available.  Once you are discharged, your primary care physician will handle any further medical issues. Please note that NO REFILLS for any discharge medications will be authorized once you are discharged, as it is imperative that you return to your primary care physician (or establish a relationship with a primary care physician if you do not have one) for your aftercare needs so that they can reassess your need for medications and monitor your lab values   Face-to-face encounter (required for Medicare/Medicaid patients)   Complete by: As directed    I Mennie LAMY certify that this patient is under my care and that I, or a nurse practitioner or physician's assistant working with me, had a face-to-face encounter that meets the physician face-to-face encounter requirements with this patient on 11/21/2024. The encounter with the patient was in whole, or in part for the following medical condition(s) which is the primary reason for home health care (List medical condition): Syncope, deconditioning   The encounter with the patient was in whole, or in part, for the following medical condition, which is the primary reason for home  health care: Syncope, deconditioning   I certify that, based on my findings, the following services are medically necessary home health services: Physical therapy   Reason for Medically Necessary Home Health Services: Therapy- Therapeutic Exercises to Increase Strength and Endurance   My clinical findings support the need for the above services: Unable to leave home safely without assistance and/or assistive device   Further, I certify that my clinical findings support that this patient is homebound due to: Unable to leave home safely without assistance   Home Health   Complete by: As directed    To provide the following care/treatments:  PT OT     Increase activity slowly   Complete by: As directed       Allergies as of 11/21/2024       Reactions   Iodine    Ivp Dye  [iodinated Contrast Media] Other (See Comments)   Pt reports n/v; arm swelling; arm itching   Amoxicillin Diarrhea, Rash, Other (See Comments)   Gadobenate Nausea And Vomiting   Pt had some nausea and vomiting immediately after contrast administered. Contrast dye   Latex Itching, Rash   Penicillins Other (See Comments), Rash   Diarrhea, rash        Medication List     STOP taking these medications    diphenhydrAMINE  50 MG capsule Commonly known as: BENADRYL    meloxicam  15 MG tablet Commonly known as: MOBIC        TAKE these medications    acetaminophen  500 MG tablet Commonly known as: TYLENOL  Take 500 mg by mouth as needed for mild pain or moderate pain.   albuterol  108 (90 Base) MCG/ACT inhaler Commonly known as: VENTOLIN  HFA Inhale 1 puff into the lungs every 4 (four) hours as needed.   aspirin  EC 81 MG tablet Take 1 tablet (81 mg total) by mouth daily. Swallow whole.   budesonide  0.5 MG/2ML nebulizer solution Commonly known as: PULMICORT  Take 2 mLs (0.5 mg total) by nebulization 2 (two) times daily. During respiratory flares for at least 2 weeks or until symptoms resolve.   co-enzyme Q-10 30 MG capsule Take 30 mg by mouth 3 (three) times daily.   Fish Oil 300 MG Caps Take by mouth.   FreeStyle Libre 3 Plus Sensor Misc Change sensor every 15 days.   ibuprofen  200 MG tablet Commonly known as: ADVIL  Take 3 tablets (600 mg total) by mouth every 8 (eight) hours as needed for mild pain (1-3).   ketoconazole  2 % cream Commonly known as: NIZORAL  APPLY 1 APPLICATION TOPICALLY 2 (TWO) TIMES DAILY. TO AFFECTED AREAS. What changed:  when to take this reasons to take this   levocetirizine 5 MG tablet Commonly known as: XYZAL  Take 1 tablet (5 mg total) by mouth every evening.   Magnesium  100 MG Caps Take 1 capsule by mouth at bedtime.   MELATONIN PO Take 1 capsule by mouth at bedtime.   mometasone  50 MCG/ACT nasal spray Commonly known as: NASONEX  Place  2 sprays into the nose daily.   montelukast  10 MG tablet Commonly known as: SINGULAIR  Take 1 tablet (10 mg total) by mouth at bedtime.   multivitamin with minerals Tabs tablet Take 1 tablet by mouth daily.   nitroGLYCERIN  0.4 MG SL tablet Commonly known as: NITROSTAT  Place 1 tablet (0.4 mg total) under the tongue every 5 (five) minutes as needed for chest pain.   Ozempic  (1 MG/DOSE) 4 MG/3ML Sopn Generic drug: Semaglutide  (1 MG/DOSE) Inject 1 mg as directed once a week.  Repatha  SureClick 140 MG/ML Soaj Generic drug: Evolocumab  Inject 140 mg into the skin every 14 (fourteen) days.   rosuvastatin  5 MG tablet Commonly known as: CRESTOR  Take 1 tablet (5 mg total) by mouth 3 (three) times a week. Every Monday, Wednesday, & Friday   Symbicort  160-4.5 MCG/ACT inhaler Generic drug: budesonide -formoterol  Inhale 2 puffs into the lungs 2 (two) times daily.   SYSTANE FREE OP Apply 2 drops to eye daily as needed (both eyes as needed).   Vitamin D  (Ergocalciferol ) 1.25 MG (50000 UNIT) Caps capsule Commonly known as: DRISDOL  Take 1 capsule (50,000 Units total) by mouth every 7 (seven) days.        Follow-up Information     Early, [name] BRAVO, NP Follow up in 1 week(s).   Specialty: Nurse Practitioner Contact information: [address] [place] [address] [phone]                Allergies[1]  The results of significant diagnostics from this hospitalization (including imaging, microbiology, ancillary and laboratory) are listed below for reference.    Microbiology: No results found for this or any previous visit (from the past 240 hours).  Procedures/Studies: DG Chest 2 View Result Date: 11/19/2024 EXAM: 2 VIEW(S) XRAY OF THE CHEST 11/19/2024 08:08:11 PM COMPARISON: 07/05/2024 CLINICAL HISTORY: syncope FINDINGS: LUNGS AND PLEURA: No focal pulmonary opacity. No pleural effusion. No pneumothorax. HEART AND MEDIASTINUM: No acute abnormality of the cardiac and  mediastinal silhouettes. BONES AND SOFT TISSUES: Multilevel bridging enthesopathy of thoracic spine. IMPRESSION: 1. No acute findings. Electronically signed by: Morgane Naveau MD MD 11/19/2024 08:15 PM EST RP Workstation: [id number]   CT Cervical Spine Wo Contrast Result Date: 11/19/2024 CLINICAL DATA:  Syncopal episode. EXAM: CT CERVICAL SPINE WITHOUT CONTRAST TECHNIQUE: Multidetector CT imaging of the cervical spine was performed without intravenous contrast. Multiplanar CT image reconstructions were also generated. RADIATION DOSE REDUCTION: This exam was performed according to the departmental dose-optimization program which includes automated exposure control, adjustment of the mA and/or kV according to patient size and/or use of iterative reconstruction technique. COMPARISON:  None Available. FINDINGS: Alignment: There is approximately 1 mm retrolisthesis of the C4 vertebral body on C5. Skull base and vertebrae: No acute fracture. No primary bone lesion or focal pathologic process. Soft tissues and spinal canal: No prevertebral fluid or swelling. No visible canal hematoma. Disc levels: Mild-to-moderate severity multilevel endplate sclerosis is seen with mild anterior osteophyte formation at the level of C4-C5 and moderate to marked severity anterior osteophyte formation at C5-C6, C6-C7 and C7-T1. Mild multilevel intervertebral disc space narrowing is seen, slightly more prominent at the levels of C5-C6 and C6-C7. Bilateral moderate to marked severity multilevel facet joint hypertrophy is noted. Upper chest: Negative. Other: None. IMPRESSION: 1. No acute fracture or traumatic subluxation of the cervical spine. 2. Mild-to-moderate severity multilevel degenerative changes, as described above. Electronically Signed   By: Suzen Dials M.D.   On: 11/19/2024 19:59   CT Head Wo Contrast Result Date: 11/19/2024 CLINICAL DATA:  Syncopal episode. EXAM: CT HEAD WITHOUT CONTRAST TECHNIQUE: Contiguous axial images  were obtained from the base of the skull through the vertex without intravenous contrast. RADIATION DOSE REDUCTION: This exam was performed according to the departmental dose-optimization program which includes automated exposure control, adjustment of the mA and/or kV according to patient size and/or use of iterative reconstruction technique. COMPARISON:  August 25, 2023 FINDINGS: Brain: There is generalized cerebral atrophy with widening of the extra-axial spaces and ventricular dilatation. There are areas of decreased  attenuation within the white matter tracts of the supratentorial brain, consistent with microvascular disease changes. Vascular: No hyperdense vessel or unexpected calcification. Skull: Normal. Negative for fracture or focal lesion. Sinuses/Orbits: No acute finding. Other: None. IMPRESSION: 1. Generalized cerebral atrophy and microvascular disease changes of the supratentorial brain. 2. No acute intracranial abnormality. Electronically Signed   By: Suzen Dials M.D.   On: 11/19/2024 19:57    Labs: BNP (last 3 results) No results for input(s): BNP in the last 8760 hours. Basic Metabolic Panel: Recent Labs  Lab 11/19/24 2017 11/20/24 0611  NA 140 139  K 3.8 3.9  CL 106 104  CO2 24 27  GLUCOSE 148* 156*  BUN 18 13  CREATININE 0.60 0.64  0.63  CALCIUM  9.9 9.5  MG  --  2.2   Liver Function Tests: Recent Labs  Lab 11/19/24 2017 11/20/24 0611  AST 19 16  ALT 10 9  ALKPHOS 67 55  BILITOT 0.2 0.3  PROT 7.4 6.4*  ALBUMIN 4.1 3.9   No results for input(s): LIPASE, AMYLASE in the last 168 hours. No results for input(s): AMMONIA in the last 168 hours. CBC: Recent Labs  Lab 11/19/24 2017 11/20/24 0611  WBC 5.2 4.9  NEUTROABS 3.4 2.7  HGB 12.4 11.9*  HCT 36.8 34.8*  MCV 79.5* 78.7*  PLT 271 224   CBG: Recent Labs  Lab 11/20/24 0823 11/20/24 1621 11/20/24 2139 11/21/24 0653 11/21/24 1119  GLUCAP 104* 114* 154* 96 103*  Thyroid  function  studies Recent Labs    11/20/24 0612  TSH 0.872   Urinalysis    Component Value Date/Time   COLORURINE YELLOW 11/19/2024 2318   APPEARANCEUR HAZY (A) 11/19/2024 2318   LABSPEC 1.024 11/19/2024 2318   LABSPEC 1.015 08/30/2024 1519   PHURINE 7.0 11/19/2024 2318   GLUCOSEU NEGATIVE 11/19/2024 2318   HGBUR NEGATIVE 11/19/2024 2318   BILIRUBINUR NEGATIVE 11/19/2024 2318   BILIRUBINUR negative 08/30/2024 1519   KETONESUR 5 (A) 11/19/2024 2318   PROTEINUR NEGATIVE 11/19/2024 2318   UROBILINOGEN 0.2 03/10/2023 0949   UROBILINOGEN 0.2 07/07/2014 1655   NITRITE NEGATIVE 11/19/2024 2318   LEUKOCYTESUR NEGATIVE 11/19/2024 2318   Sepsis Labs Recent Labs  Lab 11/19/24 2017 11/20/24 0611  WBC 5.2 4.9   Microbiology No results found for this or any previous visit (from the past 240 hours).   Time coordinating discharge: 25  minutes  SIGNED: Mennie LAMY, MD  [facility] 11/21/2024, 11:43 AM  If 7PM-7AM, please contact night-coverage [email or web address]       [1]  Allergies Allergen Reactions   Iodine    Ivp Dye [Iodinated Contrast Media] Other (See Comments)    Pt reports n/v; arm swelling; arm itching   Amoxicillin Diarrhea, Rash and Other (See Comments)   Gadobenate Nausea And Vomiting    Pt had some nausea and vomiting immediately after contrast administered.  Contrast dye   Latex Itching and Rash   Penicillins Other (See Comments) and Rash    Diarrhea, rash

## 2024-11-22 ENCOUNTER — Encounter: Payer: Self-pay | Admitting: Nurse Practitioner

## 2024-11-22 ENCOUNTER — Ambulatory Visit: Admitting: Nurse Practitioner

## 2024-11-22 VITALS — BP 132/80 | HR 83 | Wt 150.4 lb

## 2024-11-22 DIAGNOSIS — R4189 Other symptoms and signs involving cognitive functions and awareness: Secondary | ICD-10-CM

## 2024-11-22 DIAGNOSIS — R55 Syncope and collapse: Secondary | ICD-10-CM

## 2024-11-22 DIAGNOSIS — F411 Generalized anxiety disorder: Secondary | ICD-10-CM | POA: Diagnosis not present

## 2024-11-22 NOTE — Assessment & Plan Note (Signed)
 Recurrent syncope episodes with dizziness, nausea, and brief loss of consciousness. Recent fall with head impact. Review of hospital note shows no concerns with imaging. Differential includes vasovagal syncope, cardiac arrhythmia, and seizure activity. Normal orthostatic vitals, negative troponins, and normal thyroid  function have been found. Cardiac monitor results pending. Stress and dehydration are likely contributors. Compression stockings and increased salt intake discussed as potential interventions. We also discussed stress management techniques. I do feel that an EEG is appropriate given the number of times she has had these episodes- this will at least help give insight into possible focal seizure. She will follow-up with neurology to schedule this.  - Continue use of compression stockings. - Increase salt intake and water intake- this is vital- at least 2-3 L of water a day.  - Avoid standing or sitting for long periods, hot environments - Work to reduce stress- deep breathing, make lists of what HAS to be done, celebrate success - Encourage slow, purposeful movements to prevent falls. - Ordered CBC and CMP to assess overall health status. Orders:   CBC with Differential/Platelet   Comprehensive metabolic panel with GFR

## 2024-11-22 NOTE — Progress Notes (Signed)
 "  Catheline Doing, DNP, AGNP-c [facility]  [address] [place], [place] [address] [phone]   ESTABLISHED PATIENT- Chronic Health and/or Follow-Up Visit on 11/22/2024  Blood pressure 132/80, pulse 83, weight 150 lb 6.4 oz (68.2 kg).   Subjective:  other (F/u on falls, went to the hospital this weekend, gets nausea and gets dizzy and wakes up on the floor, last three times injuries are getting worse, hit head this time on counter top, )  History of Present Illness Arnett Marshall [name] Ms. Spikes or [name] is a 74 year old female who presents with recurrent episodes of syncope and dizziness.  She experiences recurrent episodes of syncope, dizziness, and nausea, with the most recent episode on January 9th resulting in a fall and a small laceration on her head. This was her second fall in six months and the fifth in two and a half years. One episode was witnessed in the emergency room and she did not experienced seizure-like activity during the episodes. She denies post-ictal state after any of the instances. She feels she is unconscious for only 10 seconds or so.   She reports experiencing hyperosmia, with heightened and unusual smells at times. She has experienced syncope both indoors and outdoors, once resulting in a twisted ankle. She has been evaluated in the hospital where her continuous glucose was 67 as the lowest reading, troponins were negative, thyroid  function was normal, and orthostatic vitals were normal. Continuous cardiac monitor results are pending- she is mailing the monitor back today.  She has started using compression stockings and performs exercises at home as recommended by an OT. She has historically avoided salt due to a family history of stroke. She reports she does not typically drink enough water, but she is working on this.   She is concerned about her living conditions and her difficulty in maintaining her home environment. She reports  difficulty getting many tasks done, but manages to maintain dishes, laundry, and breaking down boxes on a regular basis. Her stamina is not what it has been in the past, which makes other tasks difficult. The sense of being overwhelmed also makes de-clutter difficult for her.   She experiences cognitive issues, including memory problems and difficulty focusing at times. She reports feeling overwhelmed with her health and her environment much of the time. She has a long history of anxiety and she feels this is worse at this time. She does have a therapist, but has not been able to speak with her for a few months due to illness in her therapist. She will meet with her tomorrow virtually.   She has joined an research officer, trade union course and starts that today. She has had several of her friends reach out to help her in the last few weeks and has a niece that will be visiting to help her in February. She has also hired a company to help with decluttering, housekeeping, and yard work.   She has started using her cane at home when ambulating to help keep her steady. She reports that she has to maintain her focus on carrying things or she has a tendency to drop items. She reports feeling that something is very wrong. She is interested in getting and EEG.  She recently read information on the internet about vasovagal syncope and reports that she feels her symptoms are very consistent with this. She asks about this condition.   She has a history of diabetes and has experienced significant weight loss, now weighing  around 150 pounds from a previous high of 202 pounds.   ROS negative except for what is listed in HPI. History, Medications, Surgery, SDOH, and Family History reviewed and updated as appropriate.  Objective:  Physical Exam Vitals and nursing note reviewed.  Constitutional:      General: She is not in acute distress.    Appearance: Normal appearance. She is normal weight. She is not ill-appearing,  toxic-appearing or diaphoretic.  HENT:     Head: Normocephalic.  Eyes:     Extraocular Movements: Extraocular movements intact.     Pupils: Pupils are equal, round, and reactive to light.  Cardiovascular:     Rate and Rhythm: Normal rate and regular rhythm.     Pulses: Normal pulses.     Heart sounds: Normal heart sounds.  Pulmonary:     Effort: Pulmonary effort is normal.     Breath sounds: Normal breath sounds.  Musculoskeletal:        General: Normal range of motion.     Cervical back: Neck supple.  Skin:    General: Skin is warm and dry.     Capillary Refill: Capillary refill takes less than 2 seconds.  Neurological:     Mental Status: She is alert.     Cranial Nerves: No cranial nerve deficit.     Sensory: No sensory deficit.     Motor: Weakness present.     Coordination: Coordination normal.  Psychiatric:        Attention and Perception: Attention normal.        Mood and Affect: Mood is anxious.        Speech: Speech is rapid and pressured.        Behavior: Behavior is cooperative.        Thought Content: Thought content normal.        Cognition and Memory: Cognition normal.        Judgment: Judgment normal.         Assessment & Plan:   Assessment & Plan Syncope, unspecified syncope type Recurrent syncope episodes with dizziness, nausea, and brief loss of consciousness. Recent fall with head impact. Review of hospital note shows no concerns with imaging. Differential includes vasovagal syncope, cardiac arrhythmia, and seizure activity. Normal orthostatic vitals, negative troponins, and normal thyroid  function have been found. Cardiac monitor results pending. Stress and dehydration are likely contributors. Compression stockings and increased salt intake discussed as potential interventions. We also discussed stress management techniques. I do feel that an EEG is appropriate given the number of times she has had these episodes- this will at least help give insight into  possible focal seizure. She will follow-up with neurology to schedule this.  - Continue use of compression stockings. - Increase salt intake and water intake- this is vital- at least 2-3 L of water a day.  - Avoid standing or sitting for long periods, hot environments - Work to reduce stress- deep breathing, make lists of what HAS to be done, celebrate success - Encourage slow, purposeful movements to prevent falls. - Ordered CBC and CMP to assess overall health status. Orders:   CBC with Differential/Platelet   Comprehensive metabolic panel with GFR  Generalized anxiety disorder Significant stress and anxiety likely contributing to syncope episodes and memory changes. Stressors include health issues, home environment, and social pressures. Decluttering class and support from friends and family discussed as coping strategies. I do feel that restarting therapy will be helpful, with focus on ways to reduce stress. Encouraged setting  a goal for the day with a priority that she would like to accomplish and celebrate when she meets this goal- it's ok to need to work on the same goal for more than one day - Encouraged participation in decluttering class to manage stress. - Set goals - Allow help from friends and family - It may take 3-4 times the amount of time that it took for clutter to form to clear it out- this is OK.     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 Symptoms include difficulty focusing, memory lapses, and inability to multitask. Decluttering class and structured task management discussed as strategies to improve cognitive function. - Encouraged structured task management and use of notebooks for organization. - Supported participation in decluttering class to improve cognitive function.       Jaquisha Frech E Melven Stockard, DNP, AGNP-c  46 minutes spent on care provided today. Time includes care provided during the  visit (>50% total time), care coordination, chart review, and documentation.   "

## 2024-11-22 NOTE — Patient Instructions (Addendum)
 Wear compression stockings.   Increase sodium intake.   Sleep- you should get at least 7 hours of sleep a night.   Purposeful movements- start slow, give yourself a second before you move.   One task at a time- decide what is the biggest priority for the moment, start the task and work until you are tired. Allow rest. Fleurette the task when you can.  Don't worry about anything else on the list- those things will still be there when you can get to them.   Remind yourself, it is OK.

## 2024-11-23 ENCOUNTER — Other Ambulatory Visit (HOSPITAL_COMMUNITY): Payer: Self-pay

## 2024-11-23 ENCOUNTER — Ambulatory Visit: Admitting: Licensed Clinical Social Worker

## 2024-11-23 DIAGNOSIS — F4323 Adjustment disorder with mixed anxiety and depressed mood: Secondary | ICD-10-CM | POA: Diagnosis not present

## 2024-11-23 LAB — CBC WITH DIFFERENTIAL/PLATELET
Basophils Absolute: 0 x10E3/uL (ref 0.0–0.2)
Basos: 1 %
EOS (ABSOLUTE): 0.2 x10E3/uL (ref 0.0–0.4)
Eos: 4 %
Hematocrit: 39.3 % (ref 34.0–46.6)
Hemoglobin: 12.7 g/dL (ref 11.1–15.9)
Immature Grans (Abs): 0 x10E3/uL (ref 0.0–0.1)
Immature Granulocytes: 0 %
Lymphocytes Absolute: 1.6 x10E3/uL (ref 0.7–3.1)
Lymphs: 39 %
MCH: 26.5 pg — ABNORMAL LOW (ref 26.6–33.0)
MCHC: 32.3 g/dL (ref 31.5–35.7)
MCV: 82 fL (ref 79–97)
Monocytes Absolute: 0.4 x10E3/uL (ref 0.1–0.9)
Monocytes: 9 %
Neutrophils Absolute: 2 x10E3/uL (ref 1.4–7.0)
Neutrophils: 47 %
Platelets: 288 x10E3/uL (ref 150–450)
RBC: 4.79 x10E6/uL (ref 3.77–5.28)
RDW: 14 % (ref 11.7–15.4)
WBC: 4.2 x10E3/uL (ref 3.4–10.8)

## 2024-11-23 LAB — COMPREHENSIVE METABOLIC PANEL WITH GFR
ALT: 10 IU/L (ref 0–32)
AST: 16 IU/L (ref 0–40)
Albumin: 4.3 g/dL (ref 3.8–4.8)
Alkaline Phosphatase: 64 IU/L (ref 49–135)
BUN/Creatinine Ratio: 22 (ref 12–28)
BUN: 14 mg/dL (ref 8–27)
Bilirubin Total: 0.2 mg/dL (ref 0.0–1.2)
CO2: 22 mmol/L (ref 20–29)
Calcium: 9.7 mg/dL (ref 8.7–10.3)
Chloride: 106 mmol/L (ref 96–106)
Creatinine, Ser: 0.63 mg/dL (ref 0.57–1.00)
Globulin, Total: 2.6 g/dL (ref 1.5–4.5)
Glucose: 110 mg/dL — AB (ref 70–99)
Potassium: 3.8 mmol/L (ref 3.5–5.2)
Sodium: 142 mmol/L (ref 134–144)
Total Protein: 6.9 g/dL (ref 6.0–8.5)
eGFR: 93 mL/min/1.73

## 2024-11-23 NOTE — Telephone Encounter (Signed)
 Pharmacy Patient Advocate Encounter  Received notification from [employer] that Prior Authorization for FreeStyle Libre 3 Plus Sensor  has been APPROVED from 11/11/24 to 11/10/25. Ran test claim, Copay is $0. This test claim was processed through [facility]- copay amounts may vary at other pharmacies due to pharmacy/plan contracts, or as the patient moves through the different stages of their insurance plan.   PA #/Case ID/Reference #: [id number]

## 2024-11-23 NOTE — Progress Notes (Signed)
 [facility] Counselor/Therapist Progress Note  Patient ID: Pam [name], MRN: [id number]    Date: 11/23/2024  Time Spent: 1103  am - 1208 pm : 65 Minutes  Treatment Type: Individual Therapy.  Reported Symptoms: Patient reports that she has attended therapy since age 74 any time there is a life change or crisis. She reports that she feels that retirement has been a major life change.    Mental Status Exam: Appearance:  Neat     Behavior: Appropriate  Motor: Normal  Speech/Language:  Clear and Coherent  Affect: Appropriate  Mood: normal  Thought process: normal  Thought content:   WNL  Sensory/Perceptual disturbances:   WNL  Orientation: oriented to person, place, time/date, situation, day of week, month of year, and year  Attention: Good  Concentration: Good  Memory: WNL  Fund of knowledge:  Good  Insight:   Good  Judgment:  Good  Impulse Control: Good    Risk Assessment: Danger to Self:  No Self-injurious Behavior: No Danger to Others: No Duty to Warn:no Physical Aggression / Violence:No  Access to Firearms a concern: No  Gang Involvement:No    Subjective:    Microbiologist participated from home, via video Patient is aware of risk and limitations, and consented to treatment. Therapist participated from office located at [facility]. We met online due to patient request.  Patient presented for her session stating that she has been sick and has fell again putting a cut in her head from a table. She reports that she was diagnosed with Syncope and her provider believes that this is from the stress she has been under. Patient admits that her home was in bad shape and she was embarrassed to share the severity of the situation. She states that she has hired some help to take out trash and to fix her gutters. She states she is in the process of de cluttering and working toward getting her home clean.   Clinician actively listened and provided support  via verbal feedback. Clinician encouraged patient to not be afraid to ask for help. Clinician and patient processed her health concerns and the importance of maintaining her wellness. Clinician and patient also discussed the benefits of an organized space and how it can improve our mental wellness.    Pam [name] was pleasant and cooperative during her session and identified her concern over her current health and struggle to get better and be herself again. Pam [name] states she will remain as active as possible and keep contact with students and those she enjoys the same activities with. Pam [name] reports that she will increase use of mindfulness. Patient is to use CBT, mindfulness and coping skills to help manage decrease symptoms associated with their diagnosis. Treatment planning to be reviewed by 06/17/2025.    Interventions: Cognitive Behavioral Therapy, Dialectical Behavioral Therapy, Assertiveness/Communication, Mindfulness Meditation, and Solution-Oriented/Positive Psychology   Diagnosis: Adjustment Disorder with mixed Anxiety and Depression  Damien Junk MSW, LCSW/DATE 11/23/2024

## 2024-11-24 ENCOUNTER — Other Ambulatory Visit: Payer: Self-pay | Admitting: Nurse Practitioner

## 2024-11-24 DIAGNOSIS — E559 Vitamin D deficiency, unspecified: Secondary | ICD-10-CM

## 2024-11-25 ENCOUNTER — Other Ambulatory Visit (HOSPITAL_COMMUNITY): Payer: Self-pay

## 2024-11-25 ENCOUNTER — Other Ambulatory Visit: Payer: Self-pay

## 2024-11-25 MED ORDER — VITAMIN D (ERGOCALCIFEROL) 1.25 MG (50000 UNIT) PO CAPS
50000.0000 [IU] | ORAL_CAPSULE | ORAL | 3 refills | Status: AC
  Filled 2024-11-25: qty 12, 84d supply, fill #0

## 2024-11-25 NOTE — Telephone Encounter (Signed)
 Last Vit D check was 3/25 and it was 71 normal range.

## 2024-11-30 ENCOUNTER — Ambulatory Visit: Admitting: Neurology

## 2024-11-30 ENCOUNTER — Ambulatory Visit: Payer: Self-pay | Admitting: Nurse Practitioner

## 2024-11-30 DIAGNOSIS — R0683 Snoring: Secondary | ICD-10-CM

## 2024-11-30 DIAGNOSIS — G4761 Periodic limb movement disorder: Secondary | ICD-10-CM

## 2024-11-30 DIAGNOSIS — R519 Headache, unspecified: Secondary | ICD-10-CM

## 2024-11-30 DIAGNOSIS — R351 Nocturia: Secondary | ICD-10-CM

## 2024-11-30 DIAGNOSIS — Z8669 Personal history of other diseases of the nervous system and sense organs: Secondary | ICD-10-CM

## 2024-11-30 DIAGNOSIS — G4719 Other hypersomnia: Secondary | ICD-10-CM

## 2024-11-30 DIAGNOSIS — G4720 Circadian rhythm sleep disorder, unspecified type: Secondary | ICD-10-CM

## 2024-12-02 ENCOUNTER — Encounter: Payer: Self-pay | Admitting: Nurse Practitioner

## 2024-12-02 ENCOUNTER — Telehealth: Admitting: Nurse Practitioner

## 2024-12-02 VITALS — BP 131/71 | HR 89 | Wt 148.4 lb

## 2024-12-02 DIAGNOSIS — G4700 Insomnia, unspecified: Secondary | ICD-10-CM

## 2024-12-02 DIAGNOSIS — J4540 Moderate persistent asthma, uncomplicated: Secondary | ICD-10-CM | POA: Diagnosis not present

## 2024-12-02 DIAGNOSIS — F411 Generalized anxiety disorder: Secondary | ICD-10-CM | POA: Diagnosis not present

## 2024-12-02 MED ORDER — TRAZODONE HCL 50 MG PO TABS
25.0000 mg | ORAL_TABLET | Freq: Every evening | ORAL | 3 refills | Status: AC | PRN
  Filled 2024-12-02: qty 60, 60d supply, fill #0

## 2024-12-02 NOTE — Patient Instructions (Signed)
 I sent trazodone  for you to try at bedtime.  This can help with sleep maintenance significantly.  If you find that this causes you to feel groggy or sleepy the next day please discontinue use and let me know and we can work to find alternative options that may be helpful.

## 2024-12-02 NOTE — Assessment & Plan Note (Signed)
 Chronic anxiety exacerbated by stress, sleep disturbances, and health concerns. Anxiety contributes to insomnia and overall stress levels. Therapy sessions have been infrequent due to therapist's illness. Anxiety may be related to fear of aging and health changes.  Discussed with patient that interrupted or nonrestorative sleep can often exacerbate anxiety and make physical symptoms significantly worse.  Will await sleep study results.  If sleep study is found to be negative consider ongoing evaluation determine if possible alternative underlying cause could be contributing to current symptoms set. - Address anxiety through improved sleep and stress management strategies.

## 2024-12-02 NOTE — Assessment & Plan Note (Signed)
 Advised to avoid gym activities until further evaluation by pulmonologist. Symptoms include difficulty breathing and occasional asthma attacks.  No alarm symptoms are present at this time.  She reports that she feels her symptoms are well-controlled and plans to contact the pulmonologist to see a closer follow-up and evaluation can be completed for her to return to the gym. - Await follow-up with pulmonologist to assess readiness to return to gym activities. - Continue current asthma management plan.

## 2024-12-02 NOTE — Progress Notes (Signed)
 Virtual Visit Encounter mychart visit.   I connected with  Pam [name] on 12/02/24 at  3:45 PM EST by secure video and audio telemedicine application. I verified that I am speaking with the correct person using two identifiers.   I introduced myself as a Publishing Rights Manager with the practice. The limitations of evaluation and management by telemedicine discussed with the patient and the availability of in person appointments. The patient expressed verbal understanding and consent to proceed.  Participating parties in this visit include: Myself and patient  The patient is: Patient Location: Home I am: Virtual Visit Location Provider: Office/Clinic  Subjective:    CC and HPI:   History of Present Illness Pam Mcwethy Ms. Haubner or [name] is a 74 year old female who presents for follow-up of with nausea, dizziness, and sleep disturbances.  She experiences persistent nausea and dizziness, though she has not had any falls since her last visit. Her fatigue is profound, affecting her mobility, and she reports using a cane indoors. Despite these symptoms, her glucose levels remain stable.  She recently underwent a sleep study and is awaiting the results. During the study, she fell asleep quickly, which contrasts with her experience a decade ago. She experiences involuntary twitches as she tries to fall asleep, which can be intense and occur multiple times. She also reports episodes of apnea during the night.  She has a history of sleep disturbances, including nocturia between 3 to 6 AM, which sometimes prevents her from falling back asleep. She has tried melatonin in the past, but it no longer seems effective. She currently uses 15 mg of melatonin occasionally.  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 She sees a therapist once a month, which she feels is insufficient compared to her  previous weekly sessions. She has hired a company to assist with household tasks due to her limited mobility and energy.  Her family history includes respiratory and inflammatory issues, with relatives using CPAP machines for sleep apnea. She expresses concern about her health, particularly in relation to her age and family history of longevity and health issues.  She maintains a stable weight, consistently weighing between 146 to 151 pounds, and adheres to her diet. Despite her efforts to stay healthy, including managing her blood sugar levels and maintaining her weight, she is concerned about her health. She has a history of asthma and is currently managing it without returning to the gym until further evaluation.  Past medical history, Surgical history, Family history not pertinant except as noted below, Social history, Allergies, and medications have been entered into the medical record, reviewed, and corrections made.   Review of Systems:  All review of systems negative except what is listed in the HPI  Objective:    Alert and oriented x 4  Speaking in clear sentences with no shortness of breath. No distress.  Impression and Recommendations:    Assessment & Plan Insomnia, unspecified type Chronic insomnia with difficulty maintaining sleep, frequent awakenings between 3-6 AM, and non-restorative sleep. Recent sleep study indicated possible sleep apnea, sleep study results are not back yet.  Encourage patient to speak with neurologist about associated sleep symptoms to see if there are any options. 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 Symptoms include fatigue, forgetfulness, and cognitive impairment, potentially exacerbated by stress and anxiety. Melatonin has been ineffective.  Discussed the option of trazodone  trial to see if  this is effective in helping with sleep maintenance.  She is interested in trying this. - Prescribed trazodone  to be taken 30 minutes  before bedtime to aid sleep. - Await results of the recent sleep study to determine if CPAP is needed. -Discussed sleep movements and symptoms with neurology, particularly if the sleep study is negative to determine what additional testing might be effective.  Orders:   traZODone  (DESYREL ) 50 MG tablet; Take 0.5-1 tablets (25-50 mg total) by mouth at bedtime as needed for sleep.  Moderate persistent asthma, uncomplicated Advised to avoid gym activities until further evaluation by pulmonologist. Symptoms include difficulty breathing and occasional asthma attacks.  No alarm symptoms are present at this time.  She reports that she feels her symptoms are well-controlled and plans to contact the pulmonologist to see a closer follow-up and evaluation can be completed for her to return to the gym. - Await follow-up with pulmonologist to assess readiness to return to gym activities. - Continue current asthma management plan.     Generalized anxiety disorder Chronic anxiety exacerbated by stress, sleep disturbances, and health concerns. Anxiety contributes to insomnia and overall stress levels. Therapy sessions have been infrequent due to therapist's illness. Anxiety may be related to fear of aging and health changes.  Discussed with patient that interrupted or nonrestorative sleep can often exacerbate anxiety and make physical symptoms significantly worse.  Will await sleep study results.  If sleep study is found to be negative consider ongoing evaluation determine if possible alternative underlying cause could be contributing to current symptoms set. - Address anxiety through improved sleep and stress management strategies.      orders and follow up as documented in EMR I discussed the assessment and treatment plan with the patient. The patient was provided an opportunity to ask questions and all were answered. The patient agreed with the plan and demonstrated an understanding of the instructions.    The patient was advised to call back or seek an in-person evaluation if the symptoms worsen or if the condition fails to improve as anticipated.  Follow-Up: in a month  I provided 49 minutes of non-face-to-face interaction with this non face-to-face encounter including intake, same-day documentation, and chart review.   [name] CHARLENA Doing, NP , DNP, AGNP-c [facility]

## 2024-12-02 NOTE — Telephone Encounter (Signed)
 Updated sharaBETHA   NPSG Mylene shara: [phone] (exp. 11/30/24 to 02/08/25)

## 2024-12-02 NOTE — Assessment & Plan Note (Signed)
 Chronic insomnia with difficulty maintaining sleep, frequent awakenings between 3-6 AM, and non-restorative sleep. Recent sleep study indicated possible sleep apnea, sleep study results are not back yet.  Encourage patient to speak with neurologist about associated sleep symptoms to see if there are any options. 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 Symptoms include fatigue, forgetfulness, and cognitive impairment, potentially exacerbated by stress and anxiety. Melatonin has been ineffective.  Discussed the option of trazodone  trial to see if this is effective in helping with sleep maintenance.  She is interested in trying this. - Prescribed trazodone  to be taken 30 minutes before bedtime to aid sleep. - Await results of the recent sleep study to determine if CPAP is needed. -Discussed sleep movements and symptoms with neurology, particularly if the sleep study is negative to determine what additional testing might be effective.  Orders:   traZODone  (DESYREL ) 50 MG tablet; Take 0.5-1 tablets (25-50 mg total) by mouth at bedtime as needed for sleep.

## 2024-12-03 ENCOUNTER — Other Ambulatory Visit: Payer: Self-pay

## 2024-12-07 ENCOUNTER — Ambulatory Visit: Payer: Self-pay | Admitting: Neurology

## 2024-12-07 NOTE — Procedures (Signed)
 Physician Interpretation: Please see link under Procedure Tab or under Encounters tab for physician report, technical report, as well as O2 titration and/or PAP titration tables (if applicable).

## 2024-12-08 ENCOUNTER — Ambulatory Visit (HOSPITAL_BASED_OUTPATIENT_CLINIC_OR_DEPARTMENT_OTHER): Admitting: Cardiovascular Disease

## 2024-12-08 ENCOUNTER — Encounter (HOSPITAL_BASED_OUTPATIENT_CLINIC_OR_DEPARTMENT_OTHER): Payer: Self-pay | Admitting: Cardiovascular Disease

## 2024-12-08 VITALS — BP 130/64 | HR 85 | Ht 60.0 in | Wt 148.0 lb

## 2024-12-08 DIAGNOSIS — E119 Type 2 diabetes mellitus without complications: Secondary | ICD-10-CM

## 2024-12-08 DIAGNOSIS — E1169 Type 2 diabetes mellitus with other specified complication: Secondary | ICD-10-CM

## 2024-12-08 DIAGNOSIS — J4540 Moderate persistent asthma, uncomplicated: Secondary | ICD-10-CM | POA: Diagnosis not present

## 2024-12-08 DIAGNOSIS — R55 Syncope and collapse: Secondary | ICD-10-CM | POA: Diagnosis not present

## 2024-12-08 DIAGNOSIS — R072 Precordial pain: Secondary | ICD-10-CM | POA: Diagnosis not present

## 2024-12-08 DIAGNOSIS — R0609 Other forms of dyspnea: Secondary | ICD-10-CM

## 2024-12-08 DIAGNOSIS — G4733 Obstructive sleep apnea (adult) (pediatric): Secondary | ICD-10-CM | POA: Diagnosis not present

## 2024-12-08 DIAGNOSIS — I10 Essential (primary) hypertension: Secondary | ICD-10-CM | POA: Diagnosis not present

## 2024-12-08 DIAGNOSIS — E785 Hyperlipidemia, unspecified: Secondary | ICD-10-CM

## 2024-12-08 DIAGNOSIS — I25118 Atherosclerotic heart disease of native coronary artery with other forms of angina pectoris: Secondary | ICD-10-CM

## 2024-12-08 NOTE — Telephone Encounter (Signed)
 Addressed in other encounter.

## 2024-12-08 NOTE — Patient Instructions (Signed)
 Medication Instructions:  Your physician recommends that you continue on your current medications as directed. Please refer to the Current Medication list given to you today.  *If you need a refill on your cardiac medications before your next appointment, please call your pharmacy*  Lab Work: FASTING LIPID/CMET SOON   If you have labs (blood work) drawn today and your tests are completely normal, you will receive your results only by: MyChart Message (if you have MyChart) OR A paper copy in the mail If you have any lab test that is abnormal or we need to change your treatment, we will call you to review the results.  Testing/Procedures: NONE  Follow-Up: At [facility], you and your health needs are our priority.  As part of our continuing mission to provide you with exceptional heart care, our providers are all part of one team.  This team includes your primary Cardiologist (physician) and Advanced Practice Providers or APPs (Physician Assistants and Nurse Practitioners) who all work together to provide you with the care you need, when you need it.  Your next appointment:   4 month(s)  Provider:   Annabella Scarce, MD, Rosaline Bane, NP, or [name], NP    We recommend signing up for the patient portal called MyChart.  Sign up information is provided on this After Visit Summary.  MyChart is used to connect with patients for Virtual Visits (Telemedicine).  Patients are able to view lab/test results, encounter notes, upcoming appointments, etc.  Non-urgent messages can be sent to your provider as well.   To learn more about what you can do with MyChart, go to [email or web address].   Other Instructions Exercise recommendations: The American Heart Association recommends 150 minutes of moderate intensity exercise weekly. Try 30 minutes of moderate intensity exercise 4-5 times per week. This could include walking, jogging, or swimming.

## 2024-12-08 NOTE — Progress Notes (Signed)
 " Cardiology Office Note:  .   Date:  12/08/2024  ID:  Pam [name], DOB 11-05-1951, MRN [id number] PCP: Pam [name] BRAVO, NP  [facility] Providers Cardiologist:  Annabella Scarce, MD    History of Present Illness: .    Pam [name] is a 74 y.o. female with a hx of CAD, diabetes, GERD, hypertension, hyperlipidemia, COPD, diabetic neuropathy, fibromyalgia, here for follow-up. She was admitted 07/2022 with dizziness, syncope, and chest pain. Her syncope was thought to be due to vertigo and possibly vagal. Zio monitor revealed rare PACs and PVCs, and 8 beats of SVT. Cardiac enzymes were negative and an outpatient coronary CT was recommended. This revealed a calcium  score of 65 which was the 82nd percentile. There was moderate stenosis in D1 which was negative by FFR. She followed up with Pam Finder, NP 09/2022 and the patient noted that she had chest pain from fibromyalgia and costochondritis. She was started on rosuvastatin  and enrolled in the PREP program.    At her visit 12/2022 she continued to note intermittent episodes of dizziness.  Symptoms were thought to be consistent with vertigo.  She did vestibular therapy with improvement in her symptoms.  Her fibromyalgia has been better managed with physical medicine rehab.  She saw Pam Shams, NP 08/2024 with syncopal episodes.  She reported chest tightness and shortness of breath and thought maybe she was having asthma flares.  She was referred for an ambulatory monitor which revealed sinus rhythm and no significant arrhythmias.  She was admitted 11/2024 with syncope.  She was walking her dog and felt lightheaded.  There is no chest pain or shortness of breath.  In the ED she had 10-second episode of being less responsive at which time telemetry was unremarkable.  Cardiac enzymes were negative.  She had a coronary CTA 09/2024 which revealed nonobstructive disease.  Calcium  score was 169.  Echo 09/2024 revealed LVEF 60-65% and no  valvular disease.  It was thought unlikely that her episode was related to a cardiac etiology.  Discussed the use of AI scribe software for clinical note transcription with the patient, who gave verbal consent to proceed.  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 A previous episode resulted in a head injury when she hit her head on a marble countertop, causing a cut on her skull. Compression stockings and exercises have been somewhat helpful in managing her symptoms.  She has a history of fibromyalgia and costochondritis, which she distinguishes from cardiac-related symptoms. She experiences episodes of dizziness and lightheadedness, but her heart rhythm has been normal on monitoring, and no dangerous arrhythmias have been detected. No dangerous heart rhythms or seizures have been reported.  She has a family history of heart disease; her father died in his late fifties from heart disease, and her grandmother had a stroke at 28. She is concerned about reaching her upcoming [age] birthday and maintaining her health.  She manages her cholesterol with Crestor  and previously with Repatha , although she has not been compliant with Repatha  due to discomfort with the injections. She follows a pescatarian diet, occasionally adopting a vegan diet for a week each month, and has lost over sixty pounds, aiming to reach a healthy weight of 130 pounds.  She is concerned about socializing and going out alone due to fear of passing out in public. She is working on increasing  her fluid intake to at least two liters per day and is considering using a cane with a seat for support during outings.  ROS:  As per HPI  Studies Reviewed: .       13 Day Event Monitor 11/2024:  Quality: Fair.  Baseline artifact. Predominant rhythm: sinus  rhythm Average heart rate: 74 bpm Max heart rate: 136 bpm Min heart rate: 45 bpm Pauses >2.5 seconds: none  Rare (<1%) PACs and PVCs 4 beats atrial tachycardia  Patient diary entries for nausea, lightheadedness, and dizziness at which time sinus rhythm was noted.   No significant arrhythmias.   Risk Assessment/Calculations:             Physical Exam:   VS:  BP 130/64   Pulse 85   Ht 5' (1.524 m)   Wt 148 lb (67.1 kg)   SpO2 96%   BMI 28.90 kg/m  , BMI Body mass index is 28.9 kg/m. GENERAL:  Well appearing HEENT: Pupils equal round and reactive, fundi not visualized, oral mucosa unremarkable NECK:  No jugular venous distention, waveform within normal limits, carotid upstroke brisk and symmetric, no bruits, no thyromegaly LUNGS:  Clear to auscultation bilaterally HEART:  RRR.  PMI not displaced or sustained,S1 and S2 within normal limits, no S3, no S4, no clicks, no rubs, no murmurs ABD:  Flat, positive bowel sounds normal in frequency in pitch, no bruits, no rebound, no guarding, no midline pulsatile mass, no hepatomegaly, no splenomegaly EXT:  2 plus pulses throughout, no edema, no cyanosis no clubbing SKIN:  No rashes no nodules NEURO:  Cranial nerves II through XII grossly intact, motor grossly intact throughout PSYCH:  Cognitively intact, oriented to person place and time   ASSESSMENT AND PLAN: .    Assessment & Plan # Recurrent syncope Recurrent episodes with recent loss of consciousness. Normal EEG and heart monitor results. Likely due to vasovagal or neurocradiogenic dysregulation causing cerebral hypoperfusion. No structural heart issues or seizures identified. - Continue wearing compression stockings. - Increase fluid intake to at least two liters per day. - Use a walker with a seat for stability during walks. - Lay down and elevate legs if feeling faint. - Avoid driving until three months without episodes.  # Coronary artery disease:  # Hyperlipidemia:   Non-obstructive CAD.  Lipids are very well-controlled.  She has no symptoms.  Nonobstructive disease on coronary CTA.  She has not been taking Praluent consistently but is also been eating a very clean diet.  She will come in for fasting lipids and a CMP.  Continue rosuvastatin  and we will decide whether or not she needs to resume the Repatha  based on these results.  LDL goal less than 70.  Continue aspirin .  # Type 2 diabetes mellitus with hyperlipidemia LDL levels well-controlled with Crestor . Non-compliance with Repatha  noted. Dietary habits include pescatarian diet with occasional vegan week. Weight loss achieved with current regimen. - Ordered fasting lipid panel before next Repatha  dose. - Continue Crestor  as prescribed. - Encouraged adherence to dietary habits and weight management.    Dispo: f/u 4 months  Signed, Annabella Scarce, MD   "

## 2024-12-16 ENCOUNTER — Ambulatory Visit: Admitting: Licensed Clinical Social Worker

## 2024-12-16 DIAGNOSIS — F4323 Adjustment disorder with mixed anxiety and depressed mood: Secondary | ICD-10-CM

## 2024-12-16 NOTE — Progress Notes (Signed)
 [facility] Counselor/Therapist Progress Note  Patient ID: Pam [name], MRN: [id number]    Date: 12/16/24  Time Spent: 1106  am - 1200 pm : 54 Minutes  Treatment Type: Individual Therapy.  Reported Symptoms: Patient reports that she has attended therapy since age 74 any time there is a life change or crisis. She reports that she feels that retirement has been a major life change.    Mental Status Exam: Appearance:  Neat     Behavior: Appropriate  Motor: Normal  Speech/Language:  Clear and Coherent  Affect: Appropriate  Mood: normal  Thought process: normal  Thought content:   WNL  Sensory/Perceptual disturbances:   WNL  Orientation: oriented to person, place, time/date, situation, day of week, month of year, and year  Attention: Good  Concentration: Good  Memory: WNL  Fund of knowledge:  Good  Insight:   Good  Judgment:  Good  Impulse Control: Good    Risk Assessment: Danger to Self:  No Self-injurious Behavior: No Danger to Others: No Duty to Warn:no Physical Aggression / Violence:No  Access to Firearms a concern: No  Gang Involvement:No    Subjective:    Microbiologist participated from home, via phone due to patient having connectivity issues. Patient is aware of risk and limitations, and consented to treatment. Therapist participated from office located at [facility]. We met online due to patient request.   Patient presented for her session reporting that she has fallen since our last visit. Patient reports that she was upset with the care she received from the hospital. Patient states that she feels as though she was dismissed. Patient states that she continues to struggle with aging and retirement. Patient states she has began to realize that she need to begin to build a new normal and she hopes to go to the [employer] so she can get involved and be more active. She states she is continuing the  process of de cluttering and working  toward getting her home clean. Patient states that she is going to write a proposal for a play and a program fro young people.  Clinician actively listened and provided support via verbal feedback. Clinician encouraged patient to not be afraid to ask for help. Clinician and patient processed her health concerns and the importance of maintaining her wellness. Clinician and patient also discussed the benefits of an organized space and how it can improve our mental wellness.    Pam [name] was pleasant and cooperative during her session and identified her concern over her current health and struggle to get better and be herself again. Pam [name] states she will remain as active as possible and keep contact with students and those she enjoys the same activities with. Pam [name] reports that she will increase use of mindfulness. Patient is to use CBT, mindfulness and coping skills to help manage decrease symptoms associated with their diagnosis. Treatment planning to be reviewed by 06/17/2025.    Interventions: Cognitive Behavioral Therapy, Dialectical Behavioral Therapy, Assertiveness/Communication, Mindfulness Meditation, and Solution-Oriented/Positive Psychology   Diagnosis: Adjustment Disorder with mixed Anxiety and Depression     Damien Junk MSW, LCSW/DATE 12/16/2024

## 2024-12-30 ENCOUNTER — Ambulatory Visit: Admitting: Licensed Clinical Social Worker

## 2025-01-13 ENCOUNTER — Ambulatory Visit: Admitting: Licensed Clinical Social Worker

## 2025-02-22 ENCOUNTER — Ambulatory Visit: Payer: Self-pay

## 2025-04-15 ENCOUNTER — Ambulatory Visit (HOSPITAL_BASED_OUTPATIENT_CLINIC_OR_DEPARTMENT_OTHER): Admitting: Cardiovascular Disease
